# Patient Record
Sex: Male | Born: 1940 | Race: White | Hispanic: No | Marital: Married | Smoking: Never smoker
Health system: Southern US, Community
[De-identification: ages and names within clinical notes are randomized; demographics above are authoritative.]

## PROBLEM LIST (undated history)

## (undated) DIAGNOSIS — M1990 Unspecified osteoarthritis, unspecified site: Secondary | ICD-10-CM

## (undated) DIAGNOSIS — R42 Dizziness and giddiness: Secondary | ICD-10-CM

## (undated) DIAGNOSIS — I499 Cardiac arrhythmia, unspecified: Secondary | ICD-10-CM

## (undated) DIAGNOSIS — E041 Nontoxic single thyroid nodule: Secondary | ICD-10-CM

## (undated) DIAGNOSIS — R251 Tremor, unspecified: Secondary | ICD-10-CM

## (undated) DIAGNOSIS — I639 Cerebral infarction, unspecified: Secondary | ICD-10-CM

## (undated) DIAGNOSIS — I10 Essential (primary) hypertension: Secondary | ICD-10-CM

## (undated) HISTORY — PX: OTHER SURGICAL HISTORY: SHX169

## (undated) HISTORY — PX: PROSTATE SURGERY: SHX751

## (undated) HISTORY — PX: FOOT SURGERY: SHX648

## (undated) HISTORY — PX: SHOULDER SURGERY: SHX246

## (undated) HISTORY — PX: LACERATION REPAIR: SHX5168

---

## 1999-09-02 ENCOUNTER — Encounter: Payer: Self-pay | Admitting: Family Medicine

## 1999-09-02 ENCOUNTER — Ambulatory Visit (HOSPITAL_COMMUNITY): Admission: RE | Admit: 1999-09-02 | Discharge: 1999-09-02 | Payer: Self-pay | Admitting: Family Medicine

## 2001-01-30 ENCOUNTER — Encounter: Payer: Self-pay | Admitting: Gastroenterology

## 2001-01-30 ENCOUNTER — Encounter: Admission: RE | Admit: 2001-01-30 | Discharge: 2001-01-30 | Payer: Self-pay | Admitting: Gastroenterology

## 2001-02-05 ENCOUNTER — Ambulatory Visit (HOSPITAL_COMMUNITY): Admission: RE | Admit: 2001-02-05 | Discharge: 2001-02-05 | Payer: Self-pay | Admitting: Gastroenterology

## 2001-09-26 ENCOUNTER — Encounter: Payer: Self-pay | Admitting: Orthopedic Surgery

## 2001-09-26 ENCOUNTER — Ambulatory Visit: Admission: RE | Admit: 2001-09-26 | Discharge: 2001-09-26 | Payer: Self-pay | Admitting: Orthopedic Surgery

## 2001-12-27 ENCOUNTER — Ambulatory Visit (HOSPITAL_COMMUNITY): Admission: RE | Admit: 2001-12-27 | Discharge: 2001-12-27 | Payer: Self-pay | Admitting: Orthopedic Surgery

## 2002-01-21 ENCOUNTER — Inpatient Hospital Stay (HOSPITAL_COMMUNITY): Admission: RE | Admit: 2002-01-21 | Discharge: 2002-01-24 | Payer: Self-pay | Admitting: Urology

## 2002-01-21 ENCOUNTER — Encounter (INDEPENDENT_AMBULATORY_CARE_PROVIDER_SITE_OTHER): Payer: Self-pay | Admitting: Specialist

## 2002-08-29 ENCOUNTER — Ambulatory Visit (HOSPITAL_COMMUNITY): Admission: RE | Admit: 2002-08-29 | Discharge: 2002-08-29 | Payer: Self-pay | Admitting: Gastroenterology

## 2003-05-06 ENCOUNTER — Ambulatory Visit (HOSPITAL_COMMUNITY): Admission: RE | Admit: 2003-05-06 | Discharge: 2003-05-06 | Payer: Self-pay | Admitting: Gastroenterology

## 2004-06-21 ENCOUNTER — Observation Stay (HOSPITAL_COMMUNITY): Admission: RE | Admit: 2004-06-21 | Discharge: 2004-06-22 | Payer: Self-pay | Admitting: Urology

## 2005-08-19 ENCOUNTER — Inpatient Hospital Stay (HOSPITAL_COMMUNITY): Admission: AC | Admit: 2005-08-19 | Discharge: 2005-08-21 | Payer: Self-pay

## 2005-08-30 ENCOUNTER — Inpatient Hospital Stay (HOSPITAL_COMMUNITY): Admission: EM | Admit: 2005-08-30 | Discharge: 2005-09-01 | Payer: Self-pay | Admitting: Emergency Medicine

## 2005-08-31 ENCOUNTER — Encounter (INDEPENDENT_AMBULATORY_CARE_PROVIDER_SITE_OTHER): Payer: Self-pay | Admitting: *Deleted

## 2005-10-02 ENCOUNTER — Emergency Department (HOSPITAL_COMMUNITY): Admission: EM | Admit: 2005-10-02 | Discharge: 2005-10-02 | Payer: Self-pay | Admitting: Emergency Medicine

## 2005-11-09 ENCOUNTER — Encounter: Admission: RE | Admit: 2005-11-09 | Discharge: 2006-02-07 | Payer: Self-pay | Admitting: *Deleted

## 2006-12-28 IMAGING — CT CT HEAD W/O CM
1 series · 16 of 30 positions shown, 20 images · non-contrast
Comparison: None.

CLINICAL DATA: Code Stroke.  Right body weakness for the past six hours.  Right homonymous hemianopsia.  
HEAD CT WITHOUT CONTRAST ? 08/30/05:
TECHNIQUE: Contiguous axial CT images were obtained from the base of the skull through the vertex according to standard protocol without contrast.

[Series 2: routinehead 4.8 h45s · axial · 0.45mm/px · z∈[-145,-5]mm · 16 of 33 slices shown, 20 images]
[im 2/33  brain]
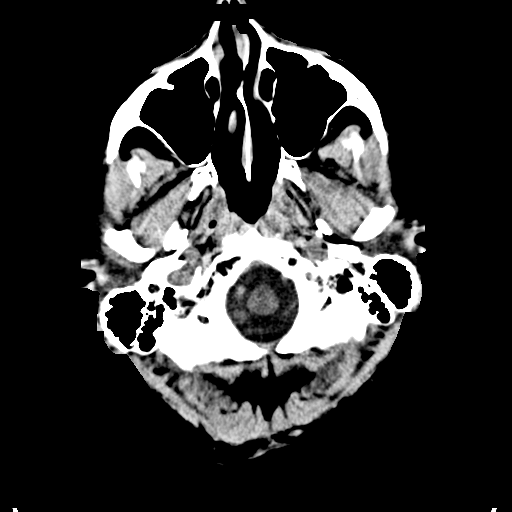
[im 2/33  bone]
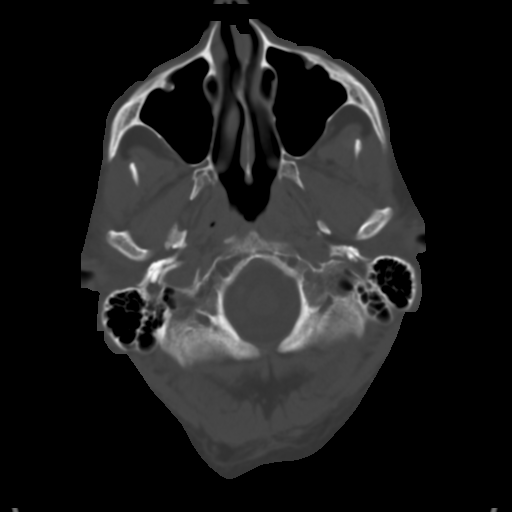
[im 4/33  brain]
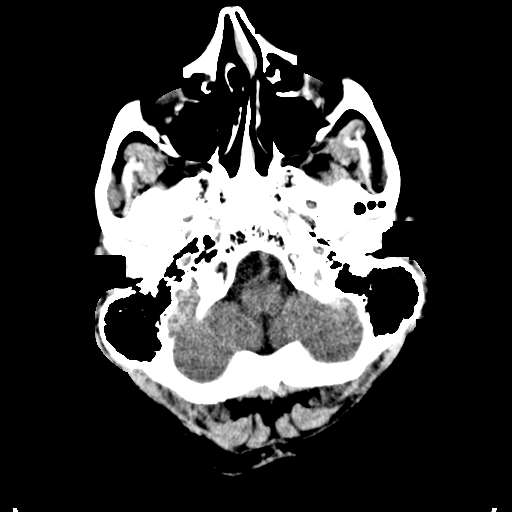
[im 6/33  brain]
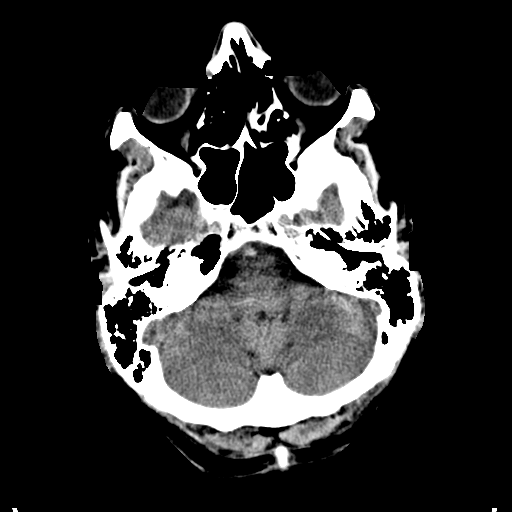
[im 8/33  brain]
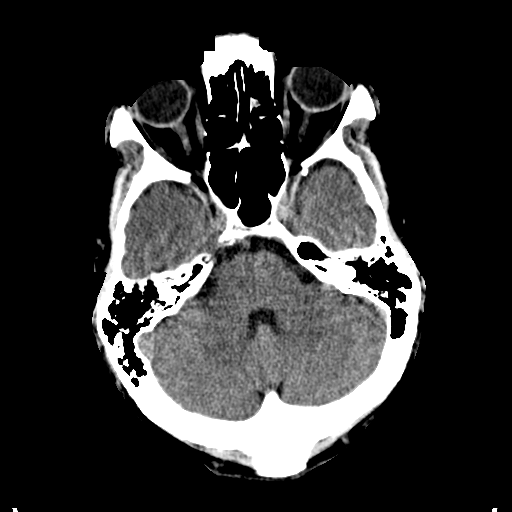
[im 9/33  brain]
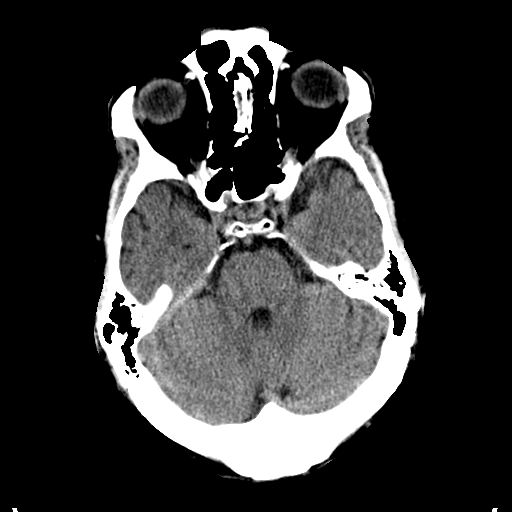
[im 9/33  bone]
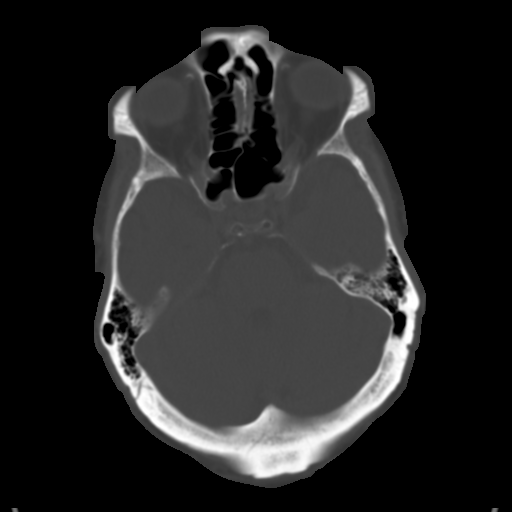
[im 12/33  brain]
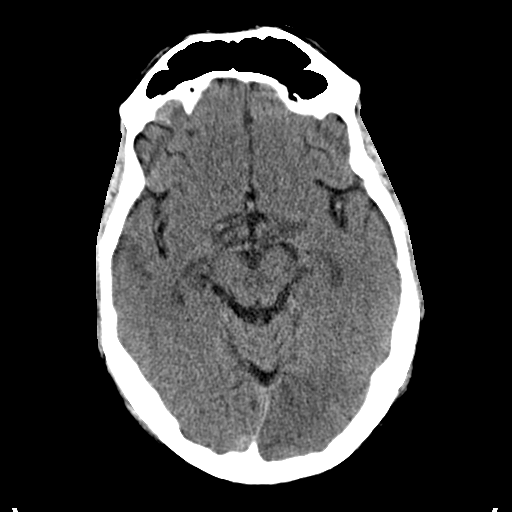
[im 14/33  brain]
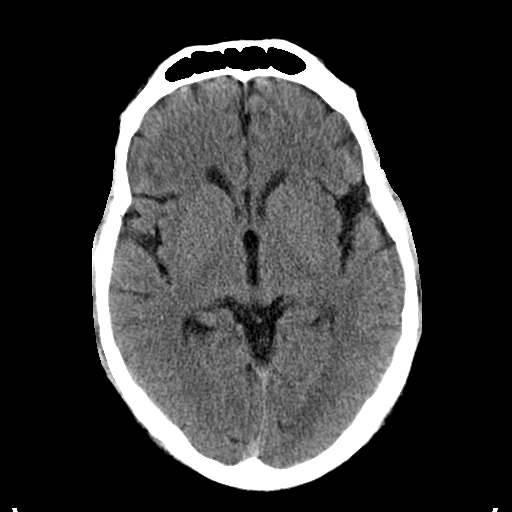
[im 16/33  brain]
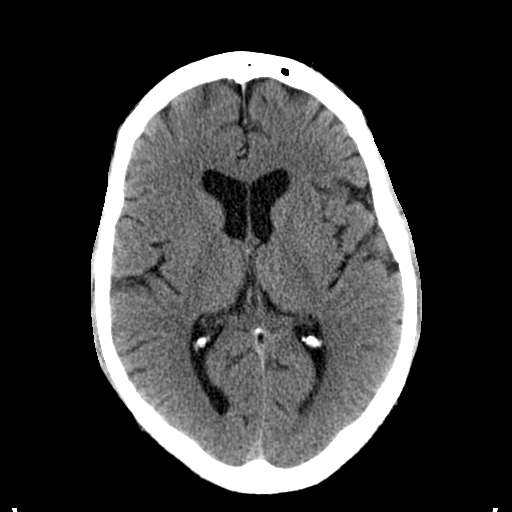
[im 17/33  brain]
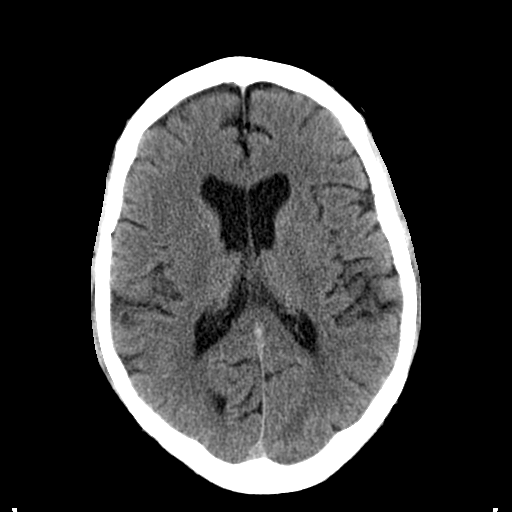
[im 17/33  bone]
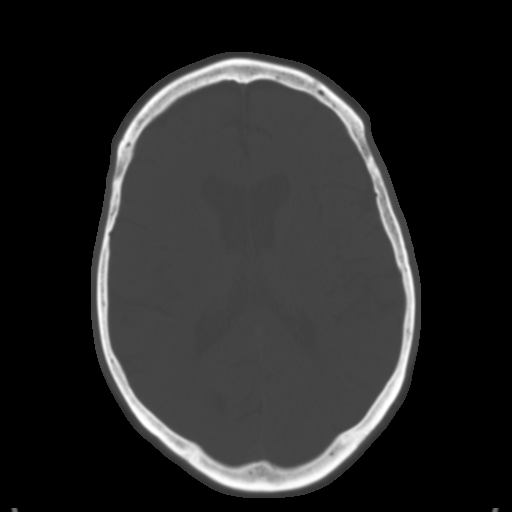
[im 19/33  brain]
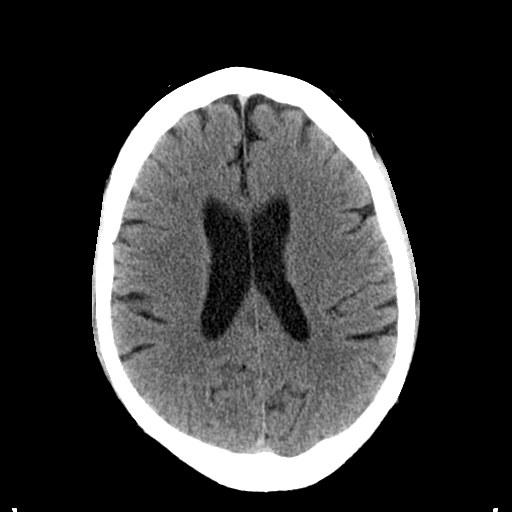
[im 21/33  brain]
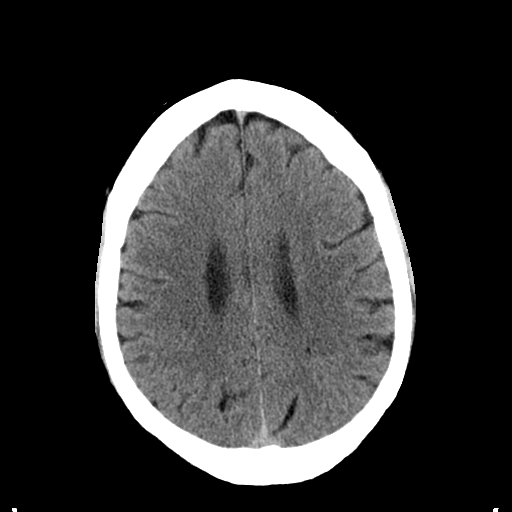
[im 24/33  brain]
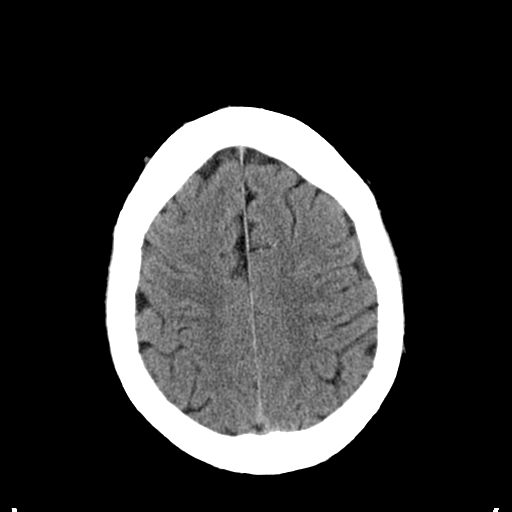
[im 25/33  brain]
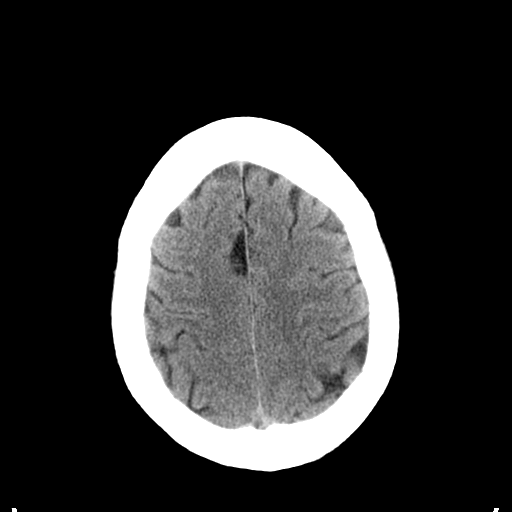
[im 25/33  bone]
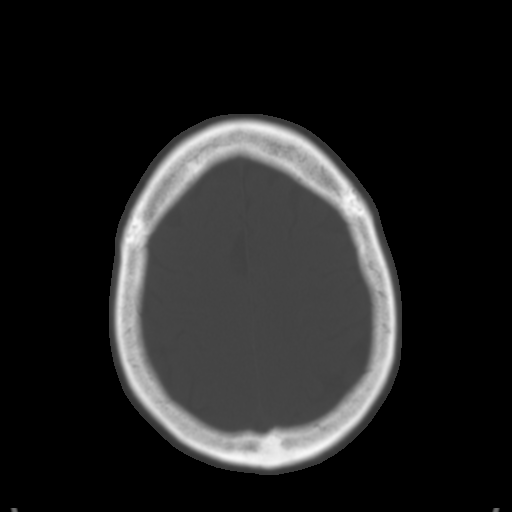
[im 27/33  brain]
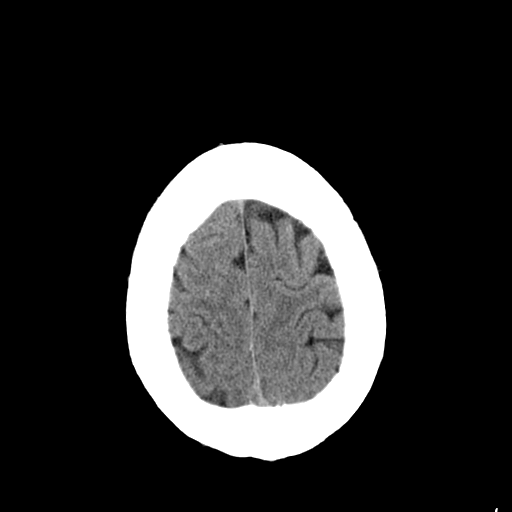
[im 29/33  brain]
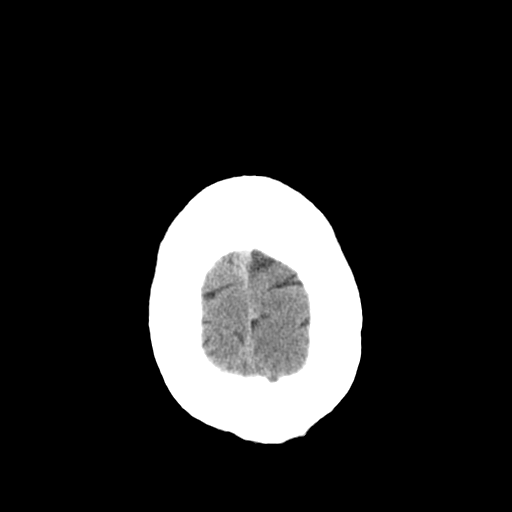
[im 31/33  brain]
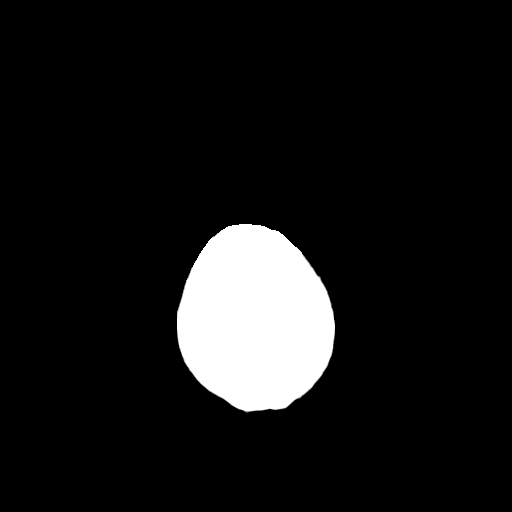

[16 of 30 positions shown; findings below may reference images not displayed]

FINDINGS: There is evidence of edema in the left occipital lobe, consistent with an acute stroke.  There is no associated hemorrhage.  There is no associated significant mass effect or midline shift.  There is no hemorrhage or hematoma elsewhere.  There is no evidence of an acute stroke elsewhere.  The ventricular system is normal in size and appearance.
The bone window images demonstrate no focal osseous abnormalities involving the skull.  The visualized paranasal sinuses and the mastoid air cells appear well aerated.
IMPRESSION: Acute non-hemorrhagic stroke involving the left occipital lobe.
These results were telephoned to Dr. Nikoss in the [HOSPITAL] the time of interpretation on 08/30/05 at 6777 hours.

## 2007-01-28 ENCOUNTER — Encounter (HOSPITAL_BASED_OUTPATIENT_CLINIC_OR_DEPARTMENT_OTHER): Admission: RE | Admit: 2007-01-28 | Discharge: 2007-04-28 | Payer: Self-pay | Admitting: Surgery

## 2007-01-30 ENCOUNTER — Ambulatory Visit (HOSPITAL_COMMUNITY): Admission: RE | Admit: 2007-01-30 | Discharge: 2007-01-30 | Payer: Self-pay | Admitting: Surgery

## 2007-04-30 ENCOUNTER — Encounter (HOSPITAL_BASED_OUTPATIENT_CLINIC_OR_DEPARTMENT_OTHER): Admission: RE | Admit: 2007-04-30 | Discharge: 2007-06-11 | Payer: Self-pay | Admitting: Surgery

## 2008-08-14 ENCOUNTER — Ambulatory Visit: Payer: Self-pay | Admitting: Vascular Surgery

## 2008-08-17 ENCOUNTER — Ambulatory Visit: Payer: Self-pay | Admitting: Vascular Surgery

## 2008-08-20 ENCOUNTER — Ambulatory Visit: Payer: Self-pay | Admitting: Vascular Surgery

## 2008-08-27 ENCOUNTER — Ambulatory Visit: Payer: Self-pay | Admitting: Vascular Surgery

## 2008-08-31 ENCOUNTER — Ambulatory Visit: Payer: Self-pay | Admitting: Vascular Surgery

## 2008-09-07 ENCOUNTER — Ambulatory Visit: Payer: Self-pay | Admitting: Vascular Surgery

## 2008-09-16 ENCOUNTER — Ambulatory Visit: Payer: Self-pay | Admitting: Vascular Surgery

## 2008-09-22 ENCOUNTER — Ambulatory Visit: Payer: Self-pay | Admitting: Vascular Surgery

## 2008-09-23 ENCOUNTER — Ambulatory Visit: Payer: Self-pay | Admitting: Vascular Surgery

## 2008-10-20 ENCOUNTER — Ambulatory Visit: Payer: Self-pay | Admitting: Vascular Surgery

## 2009-06-01 ENCOUNTER — Ambulatory Visit: Payer: Self-pay | Admitting: Vascular Surgery

## 2010-09-06 NOTE — Procedures (Signed)
DUPLEX DEEP VENOUS EXAM - LOWER EXTREMITY   INDICATION:  Follow up right greater saphenous vein ablation.   HISTORY:  Edema:  Yes.  Trauma/Surgery:  Yes.  Pain:  Tender.  PE:  No.  Previous DVT:  Yes.  Anticoagulants:  Other:   DUPLEX EXAM:                CFV   SFV   PopV  PTV    GSV                R  L  R  L  R  L  R   L  R  L  Thrombosis    o  o  o     +     o      +  Spontaneous   +  +  +     o     +      +  Phasic        +  +  +     o     +      +  Augmentation  +  +  +     o     +      +  Compressible  +  +  +     o     +      +  Competent     +  +  +     +     +      +   Legend:  + - yes  o - no  p - partial  D - decreased   IMPRESSION:  1. Deep venous thrombosis noted in the right popliteal vein, age      undetermined.  2. The reverse greater saphenous vein appears ablated from the      saphenofemoral junction to the below-knee level.    _____________________________  Quita Skye. Hart [place], M.D.   MG/MEDQ  D:  09/07/2008  T:  09/07/2008  Job:  [id number]

## 2010-09-06 NOTE — Assessment & Plan Note (Signed)
[facility]   NAME:  LAMAR, METER                 ACCOUNT NO.:  [id number]   MEDICAL RECORD NO.:  [id number]      DATE OF BIRTH:  24-Nov-1940   PHYSICIAN:  Theresia Majors. Tanda Rockers, M.D. VISIT DATE:  05/09/2007                                   OFFICE VISIT   SUBJECTIVE:  Mr. Bunton is a 70 year old man who we are following for a  right medial stasis ulcer.  In the interim, we have treated him with an  Unna wrap.  He returns for follow-up.  There is been no interim  excessive drainage, malodor, pain or fever.   Blood pressure is 109/70, respirations 18, pulse rate 77, temperature is  98.0.  Inspection of the wound shows a well-adherent eschar with no drainage,  mild hyperemia, but persistence of 2+ edema.  The foot is warm, but it  is not feverish. It is nontender.  There is no evidence of an ascending  infection or ischemia.   ASSESSMENT:  Improvement in stasis ulcer.   PLAN:  We will return the patient to an Unna boot and reevaluate in 1  week.      Harold A. Tanda Rockers, M.D.  Electronically Signed     HAN/MEDQ  D:  05/09/2007  T:  05/09/2007  Job:  [id number]

## 2010-09-06 NOTE — Assessment & Plan Note (Signed)
[facility]   NAME:  JHONNIE, ALIANO                 ACCOUNT NO.:  [id number]   MEDICAL RECORD NO.:  [id number]      DATE OF BIRTH:  May 25, 1940   PHYSICIAN:  Theresia Majors. Tanda Rockers, M.D. VISIT DATE:  05/16/2007                                   OFFICE VISIT   SUBJECTIVE:  Mr. Marinaro is a 70 year old man who we are following for  stasis ulcer involving the medial aspect of his right lower extremity  returns for follow-up.  In the interim he has worn an Unna wrap.  There  has been no excessive drainage, malodor, pain or fever.   OBJECTIVE:  Blood pressure is 130/81, respirations 16, pulse rate 79,  temperature is 98.4.  Inspection of the right medial leg shows that there is a softened eschar  loosely adherent. This was mechanically debrided disclosing a punctate  area of full-thickness skin loss, but with 100% granulation.  There was  no excessive tenderness.  Capillary refill was brisk.  Pedal pulses were  faint.   ASSESSMENT:  Clinical improvement of stasis ulcer.   PLAN:  We are returning the patient to an Unna wrap and we will  reevaluate him in 1 week.  We have given the patient prescription for  bilateral below-the-knee 30-40 mm compression hose and we anticipate  that he will make a transition to compression hose during his next  visit.  We have given the patient opportunity to ask questions.  He  understands the treatment objectives and indicates that he will be  compliant.      Harold A. Tanda Rockers, M.D.  Electronically Signed     HAN/MEDQ  D:  05/16/2007  T:  05/16/2007  Job:  [id number]

## 2010-09-06 NOTE — Assessment & Plan Note (Signed)
[facility]   NAME:  [name], [name]                 ACCOUNT NO.:  [id number]   MEDICAL RECORD NO.:  [id number]      DATE OF BIRTH:  09-Dec-1940   PHYSICIAN:  Theresia Majors. Tanda Rockers, M.D. VISIT DATE:  04/15/2007                                   OFFICE VISIT   SUBJECTIVE:  The patient is a 70 year old man with a stasis ulcer  involving his right medial lower extremity.  He is also complaining of  symptomatic callus on his right third toe.  There has been no interim  drainage, malodor, pain, or fever.   OBJECTIVE:  VITAL SIGNS:  Blood pressure 166/68, respirations 18, pulse  rate 98.2.   Inspection of the wound shows a persistence of 1+ to 2+ edema.  There is  no evidence of ischemia.  There is no evidence of ascending infection or  cellulitis.  The callus was excised from the third toe of the right  foot.   ASSESSMENT:  Improving stasis ulcer right medial lower extremity.   PLAN:  We will return the patient to an [place] boot and reevaluate him in 7-  10 days.      Harold A. Tanda Rockers, M.D.  Electronically Signed     HAN/MEDQ  D:  04/15/2007  T:  04/15/2007  Job:  [id number]

## 2010-09-06 NOTE — Assessment & Plan Note (Signed)
OFFICE VISIT   KNUT, RONDINELLI I  DOB:  09/02/1940                                       06/01/2009  [id number]#:[id number]   The patient is well known to me having performed laser ablation of the  medial accessory trunk of the right great saphenous vein on Aug 31, 2008  for a stasis ulcer in the right ankle.  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  He fell and injured his right knee following his procedure  and has been followed by Dr. Milly Jakob for that.  He is concerned about  the skin on his right ankle today and about whether an ulcer may be  developing and reported to the office.  He denies any increasing  swelling or pain or bleeding or infection in the right ankle area.   REVIEW OF SYSTEMS:  The patient denies any chest pain, dyspnea on  exertion, PND or orthopnea.   PHYSICAL EXAMINATION:  Vital signs:  Blood pressure 140/70, heart rate  80.  General:  He is alert and oriented x3.  Chest:  Clear to  auscultation.  Abdomen:  Soft, nontender with no masses.  Lower  extremity exam reveals 3+ femoral, popliteal, and dorsalis pedis pulses  bilaterally.  The right lower leg continues to have hyperpigmentation  with dermato-liposclerosis with no active ulceration.  Edema is 1+.  No  bulging varicosities are noted.   I reviewed all of his previous venous duplex studies and discussed these  with him.  I think the best plan is short-leg elastic compression  stockings.  I see no evidence of any new stasis ulcers so I do not think  we need to repeat the venous duplex today.  If he should develop any  change in the skin with any active ulceration, he will be in touch with  [place] for further followup, otherwise we will see him on a p.r.n. basis.  He will wear short-leg elastic stockings on a chronic basis.     Quita Skye Hart [place], M.D.  Electronically Signed   JDL/MEDQ  D:  06/01/2009  T:   06/02/2009  Job:  [id number]
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
## 2010-09-06 NOTE — Assessment & Plan Note (Signed)
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  He had some mild discomfort in the thigh  and groin area following his procedure which he said was not severe.  His Unna boot has been in place since last week and he has noted some  mild drainage through the Unna boot but states that his discomfort level  has improved significantly and he has had no change in the distal edema  at this point.  He has been ambulating without difficulty.   PHYSICAL EXAMINATION:  Today, blood pressure 129/83, heart rate 70,  respirations 14.  The Unna boot was removed and the ulcer is less deep  than it has been in the past and today measures a maximum 4 cm x 2 cm,  adjacent to the medial malleolus on the right leg.  He has some mild  tenderness to deep palpation of his great saphenous vein from the knee  to the saphenofemoral junction.  There is mild distal edema present and  he has a palpable dorsalis pedis pulse.   Venous duplex exam today revealed no evidence of deep venous obstruction  except in the right popliteal vein which is a chronic obstruction noted  previously.  Right great saphenous vein is totally ablated from the SFJ  to below the knee.   [name] reassured him regarding these findings, will continue weekly Unna boot  changes and [name] will see me in 6 weeks for further follow up.   Quita Skye Hart [place], M.D.  Electronically Signed   JDL/MEDQ  D:  09/07/2008  T:  09/08/2008  Job:  [id number]
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
## 2010-09-06 NOTE — Assessment & Plan Note (Signed)
[facility]   NAME:  [name], [name]                 ACCOUNT NO.:  [id number]   MEDICAL RECORD NO.:  [id number]      DATE OF BIRTH:  Sep 10, 1940   PHYSICIAN:  Jake Shark A. Tanda Rockers, M.D.      VISIT DATE:                                   OFFICE VISIT   SUBJECTIVE:  The patient returns to the clinic for evaluation of a  stasis ulcer.  In the interim he continues to be ambulatory.  There is  been no excessive drainage, malodor, pain or fever.   OBJECTIVE:  Blood pressure is 115/70, respirations 16, pulse rate 74,  temperature 97.7.  Inspection of the right medial lower extremity shows  that the ulcer continues to contract.  There is a 100% granulation with  advance in epithelium and scant drainage.  There is no evidence of  ischemia or ascending infection.   ASSESSMENT:  Clinical improvement of stasis ulcer to external  compression.   PLAN:  We will resume Unna boot compression and reevaluate the patient  in 1 week.      Harold A. Tanda Rockers, M.D.  Electronically Signed     HAN/MEDQ  D:  04/23/2007  T:  04/23/2007  Job:  [id number]

## 2010-09-06 NOTE — Assessment & Plan Note (Signed)
[facility]   NAME:  [name], [name]                 ACCOUNT NO.:  [id number]   MEDICAL RECORD NO.:  [id number]      DATE OF BIRTH:  1941/04/06   PHYSICIAN:  Jake Shark A. Tanda Rockers, M.D. VISIT DATE:  02/18/2007                                   OFFICE VISIT   SUBJECTIVE:  Mr. Puertas is a 70 year old man who we are treating for a  stasis ulcer of the right medial malleolus.  In the interim, we have  treated him with Silverlon swath and an Radio broadcast assistant.  There has been  moderate drainage.  No malodor, no pain and no fever.  He returns for  followup.   OBJECTIVE:  VITAL SIGNS:  Blood pressure is 118/78, respirations 18,  pulse rate 72, temperature is 97.7.  GENERAL:  He is accompanied by his wife.  EXTREMITIES:  Inspection of the right lower extremity shows that the  ulcer has contracted significantly.  There is no evidence of ascending  infection.  The pedal pulse remains palpable.  The erythema surrounding  the wound has decreased significantly, and there is no significant  tenderness.  There is no evidence of ischemia.   ASSESSMENT:  Clinical improvement with compression.   PLAN:  We will resume the use of the Unna boot and reevaluate the  patient in one week.      Harold A. Tanda Rockers, M.D.  Electronically Signed     HAN/MEDQ  D:  02/18/2007  T:  02/18/2007  Job:  [id number]

## 2010-09-06 NOTE — Assessment & Plan Note (Signed)
[facility]   NAME:  [name], [name]                 ACCOUNT NO.:  [id number]   MEDICAL RECORD NO.:  [id number]      DATE OF BIRTH:  March 23, 1941   PHYSICIAN:  Theresia Majors. Tanda Rockers, M.D. VISIT DATE:  05/02/2007                                   OFFICE VISIT   SUBJECTIVE:  [name] is a 70 year old man whom we are treating for a  stasis ulcer on the medial aspect of his right lower extremity. In the  interim, he denies pain. He continues to be ambulatory. There has been  no excessive drainage or malodor.   OBJECTIVE:  Blood pressure is 126/72, respiratory rate 18, pulse rate  74, temperature 97.9.  Inspection of the right lower extremity shows that the edema is well-  controlled as manifested by linear wrinkles in the leg. The ulcer itself  has decreased significantly. There is now 100% healthy granulation with  advancing epithelium. There is no evidence of infection. The foot is  warm, but not feverish. The pedal pulse is palpable.   ASSESSMENT:  Clinical improvement of stasis ulcer.   PLAN:  We will continue the Unna wrap. We will reevaluate the patient in  one week.      Harold A. Tanda Rockers, M.D.  Electronically Signed     HAN/MEDQ  D:  05/02/2007  T:  05/02/2007  Job:  [id number]

## 2010-09-06 NOTE — Assessment & Plan Note (Signed)
[facility]   NAME:  NHIA, HEAPHY                 ACCOUNT NO.:  [id number]   MEDICAL RECORD NO.:  [id number]      DATE OF BIRTH:  July 13, 1940   PHYSICIAN:  Jake Shark A. Tanda Rockers, M.D. VISIT DATE:  03/07/2007                                   OFFICE VISIT   SUBJECTIVE:  [name] is a 70 year old man who has an ulceration on the  medial aspect of his lower extremity due to venostasis.  We have treated  him with compression wrap.  He continues to be ambulatory.  There is no  excessive drainage, malodor or fever.   OBJECTIVE:  Blood pressure is 112/74, respirations 18, pulse rate 67,  temperature is 98.5.  Inspection of the right medial lower extremity  shows that the ulcer is decreased in area and there is now 100%  granulation with advancing epithelium.  There is no evidence of  infection or ischemia.   ASSESSMENT:  Clinically improved stasis ulcer.   PLAN:  We will continue compression wrap and evaluate the patient in 1  wee.      Harold A. Tanda Rockers, M.D.  Electronically Signed     HAN/MEDQ  D:  03/07/2007  T:  03/08/2007  Job:  [id number]

## 2010-09-06 NOTE — Assessment & Plan Note (Signed)
[facility]   NAME:  [name], [name] NO.:  [id number]   MEDICAL RECORD NO.:  [id number]      DATE OF BIRTH:  Dec 28, 1940   PHYSICIAN:  Maxwell Caul, M.D. VISIT DATE:  03/28/2007                                   OFFICE VISIT   LOCATION:  [facility]   Mr. [name] returns today in follow-up for a stasis ulcer involving the  right medial lower extremity.  He has been treated with an Unna wrap.  He continues to be ambulatory.  He does not have any complaints of  excessive pain, malodor or fever.   PHYSICAL EXAMINATION:  His temperature is 97.9, pulse 74, respirations  18, blood pressure 118/87.  The wound itself measures 1 x 1.2 x 0.1.  There has been  obvious some  healing of this wound, although the dimensions have not changed that  much in recent visits.  The wound itself looks dry, but clean.  There is  no evidence of infection.   IMPRESSION:  Predominately stasis wound.  I have gone ahead and put  Chromogranin and hydrogel on this area to see if lubrication will help  with healing. We have reapplied his Unna. Will see him again in a week.  I have given him permission to removed this prior to coming for  showering purposes.           ______________________________  Maxwell Caul, M.D.     MGR/MEDQ  D:  03/28/2007  T:  03/28/2007  Job:  [id number]

## 2010-09-06 NOTE — Assessment & Plan Note (Signed)
[facility]   NAME:  [name]                 ACCOUNT NO.:  [id number]   MEDICAL RECORD NO.:  [id number]      DATE OF BIRTH:  01/25/1941   PHYSICIAN:  Theresia Majors. Tanda Rockers, M.D. VISIT DATE:  02/07/2007                                   OFFICE VISIT   SUBJECTIVE:  [name] is a 70 year old man who we are following for a  right medial lower extremity stasis ulcer.  In the interim, we have  treated him with an Unna wrap.  He continues to be ambulatory.  He is  complaining of moderate drainage, but no malodor, and no fever.  He is  accompanied by his wife.   OBJECTIVE:  VITAL SIGNS:  Blood pressure 130/79, respirations 16, pulse  rate 72, temperature 98.1.  EXTREMITIES:  Inspection of the right lower extremity shows that there  is 2+ edema.  The ulcer itself has a moderate amount of nonviable tissue  with partial slough and necrosis centrally.  Under EMLA block, a sharp  excisional debridement was performed.  Nonviable tissue, chronic  inflammatory tissue and necrosed skin were excised.  The wound was  irrigated.   ASSESSMENT:  Adequately debrided stasis ulcer.   PLAN:  We will apply compression wrap and Unna wrap to control the  edema.  We will continue serial exams and debridements as indicated.  There is no evidence of ascending infection or ischemia. We will see the  patient in one week.      Harold A. Tanda Rockers, M.D.  Electronically Signed     HAN/MEDQ  D:  02/07/2007  T:  02/08/2007  Job:  [id number]

## 2010-09-06 NOTE — Procedures (Signed)
LOWER EXTREMITY VENOUS REFLUX EXAM   INDICATION:  Right lower extremity varicose vein with pain and  nonhealing ulcer.   EXAM:  Using color-flow imaging and pulse Doppler spectral analysis, the  right and left common femoral vein, superficial femoral vein, popliteal,  posterior tibial, greater and lesser saphenous veins are evaluated.  There is no evidence suggesting deep venous insufficiency in the right  lower extremity.   The right saphenofemoral junction is competent.  The right GSV medial  branch is not competent, with the caliber as described below.   The right proximal short saphenous vein demonstrates competency.   GSV Diameter (used if found to be incompetent only)                                            Right    Left  Proximal Greater Saphenous Vein           Medial branch - 0.75 cm cm  Proximal-to-mid-thigh                     0.75 cm  cm  Mid thigh                                 0.83 cm  cm  Mid-distal thigh                          0.83 cm  cm  Distal thigh                              0.81 cm  cm  Knee                                      0.81 cm  cm   IMPRESSION:  1. The right medial branch of the greater saphenous vein reflux is      identified with the caliber ranging from 0.53 cm to 0.75 cm ankle      to proximal thigh.  2. The right greater saphenous vein is not aneurysmal.  3. The right greater saphenous vein is not tortuous.  4. The deep venous system is competent.  5. The right lesser saphenous vein is competent.  6. Deep venous thrombosis noted in the right popliteal vein, age      undetermined, it appears acute.   ___________________________________________  Quita Skye. Hart Rochester, M.D.   MG/MEDQ  D:  08/17/2008  T:  08/17/2008  Job:  [id number]

## 2010-09-06 NOTE — Assessment & Plan Note (Signed)
OFFICE VISIT   JANTZEN, PILGER I  DOB:  05-Mar-1941                                       09/23/2008  EAVWU#:[id number]   The patient called last night when I was on call.  Said that he had had  an Radio broadcast assistant placed earlier in the day and was having soreness and  difficulty with feeling that the [employer] was cutting into the ulcer on  his right medial malleolus.  He had been having weekly Unna boot  treatment and was having success with this.  He reported that he had had  some discomfort before and was having more irritation and felt that it  was on a sensitive spot.  I instructed him to come in and see [place] first  thing this morning and that if he was having continued pain that he  could remove the [employer].  He did in fact remove the Unna boot during  the evening.   On physical exam he does not have any evidence of infection or worsening  skin breakdown in comparing pictures from yesterday.  I discussed  options with the patient.  I explained that we could continue with the  Unna boot treatment or we could try Silvadene under an Ace wrap for  compression.  I explained that with this option that he could change it  on a daily basis and he wishes to try this.   We placed Silvadene 4x4 gauze and 4 inch Ace and instructed him on how  to do this.  He reports that this feels much better and will keep his  followup with Dr. Hart Rochester in approximately 4 weeks.  He will discontinue  the weekly Unna boot treatment.   Larina Earthly, M.D.  Electronically Signed   TFE/MEDQ  D:  09/23/2008  T:  09/24/2008  Job:  [id number]
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
## 2010-09-06 NOTE — Assessment & Plan Note (Signed)
OFFICE VISIT   [name], [name]  DOB:  1940/12/02                                       10/20/2008  ZOXWR#:[id number]   The patient returns today for further followup regarding his venous  stasis in the right ankle.  He is status post laser ablation of his  right great saphenous vein performed by me on May 10 of this year.  He  saw Dr. Arbie Cookey a few weeks ago and changed his regimen to Silvadene to  the ulcer bed with an Ace wrap.  The ulcer has now completely healed and  his biggest concern now is that he fell a week or so ago and injured his  right knee and is being seen by Dr. Milly Jakob for this.  This did not  require surgery.  He does continue to have some swelling in the ankle  but his ulcer has healed.   PHYSICAL EXAMINATION:  On examination today, blood pressure 121/70,  heart rate 69, respirations 14.  He does have 1-2+ edema in the right  foot and evidence of the old healed ulcers adjacent to the medial  malleolus.  He has good arterial pulses in the right foot.   [name] have encouraged him to begin a regimen of wearing a short leg elastic  compression stockings and to elevate his leg at night to prevent  recurrence of the ulcer.  He will return to see [place] on a p.r.n. basis.   Quita Skye Hart Rochester, M.D.  Electronically Signed   JDL/MEDQ  D:  10/20/2008  T:  10/21/2008  Job:  [id number]

## 2010-09-06 NOTE — Assessment & Plan Note (Signed)
[facility]   NAME:  DENO, SIDA                 ACCOUNT NO.:  [id number]   MEDICAL RECORD NO.:  [id number]      DATE OF BIRTH:  23-Nov-1940   PHYSICIAN:  Theresia Majors. Tanda Rockers, M.D. VISIT DATE:  05/27/2007                                   OFFICE VISIT   SUBJECTIVE:  Mr. Sterkel is a 70 year old man who we are following for  stasis ulcer involving his medial right lower extremity.  In the  interim, we have treated him with compression wrap.  There has been no  excessive drainage, malodor, pain or fever.   OBJECTIVE:  Blood pressure is 124/73, respirations are 18, pulse rate  70, temperature 98.  Inspection of the wound shows that it has  completely resolved.  The edema is well controlled with compression  wrap.   ASSESSMENT:  Resolved stasis ulcer.   PLAN:  We are making the transition to a bilateral 30-40 mm open toed  compression garment.  We have given the patient a prescription for the  same.  He is to procure this garment and to begin wearing it  immediately.  Also, we have given him a prescription for a custom insert  for his shoes.  We have given the patient an opportunity to ask  questions.  He seems to understand these instructions and indicates that  he will be compliant.  The patient is discharged.      Harold A. Tanda Rockers, M.D.  Electronically Signed     HAN/MEDQ  D:  05/27/2007  T:  05/28/2007  Job:  [id number]   cc:   Molly Maduro L. Foy [place], M.D.

## 2010-09-06 NOTE — Assessment & Plan Note (Signed)
[facility]   NAME:  MYLEZ, VENABLE                 ACCOUNT NO.:  [id number]   MEDICAL RECORD NO.:  [id number]      DATE OF BIRTH:  1941/03/25   PHYSICIAN:  Jake Shark A. Tanda Rockers, M.D. VISIT DATE:  03/14/2007                                   OFFICE VISIT   SUBJECTIVE:  [name] is a 70 year old man who we are following for a  stasis ulcer involving the right medial lower extremity.  In the  interim, we have treated him with an Unna wrap.  He continues to be  ambulatory.  There has been no excessive pain, malodor or fever.   OBJECTIVE:  Blood pressure is 127/73, respirations 18, pulse rate 74,  temperature is 97.8.  Inspection of the medial lower leg shows that  there is 100% granulation.  There is an advancement of epithelium from  the periphery.  The edema is still 2+.  The pedal pulse remains  palpable.   ASSESSMENT:  Clinical response to compression therapy.   PLAN:  We will renew the Unna wrap and reevaluate the patient in one  week.      Harold A. Tanda Rockers, M.D.  Electronically Signed     HAN/MEDQ  D:  03/14/2007  T:  03/15/2007  Job:  [id number]

## 2010-09-06 NOTE — Assessment & Plan Note (Signed)
[facility]   NAME:  [name], [name]                 ACCOUNT NO.:  [id number]   MEDICAL RECORD NO.:  [id number]      DATE OF BIRTH:  12/18/1940   PHYSICIAN:  Theresia Majors. Tanda Rockers, M.D. VISIT DATE:  04/04/2007                                   OFFICE VISIT   SUBJECTIVE:  [name] is a 70 year old man who we are treating for a  stasis ulcer involving his right lower extremity.  In the interim he has  worn a multilayer compressive wrap.  There has been no excessive  drainage, malodor, pain or fever.  He continues to be ambulatory.   OBJECTIVE:  Blood pressure is 99/73, respirations 18, pulse rate 77,  temperature is 97.8.  Inspection of the right lower extremity shows that  the granulation is persistent.  There is good control of his edema.  There is no evidence of active infection or ischemia.   ASSESSMENT:  Clinical response of stasis ulcer to external compression.   PLAN:  We will return the patient to an Unna boot and reevaluate him in  1 week.      Harold A. Tanda Rockers, M.D.  Electronically Signed     HAN/MEDQ  D:  04/04/2007  T:  04/05/2007  Job:  [id number]

## 2010-09-06 NOTE — Assessment & Plan Note (Signed)
[facility]   NAME:  [name], [name]                 ACCOUNT NO.:  [id number]   MEDICAL RECORD NO.:  [id number]      DATE OF BIRTH:  Feb 08, 1941   PHYSICIAN:  Theresia Majors. Tanda Rockers, M.D. VISIT DATE:  02/28/2007                                   OFFICE VISIT   SUBJECTIVE:  [name] is a 70 year old man who we have followed for  stasis ulcer of the right medial malleolus.  In the interim, we have  treated him with an Radio broadcast assistant.  He denies excessive drainage, malodor,  pain, or fever.   OBJECTIVE:  VITAL SIGNS:  Blood pressure is 117/74, respirations 16,  pulse rate 75, temperature is 98.6.  EXTREMITIES:  Inspection of the right lower extremity shows that the  medial malleolus ulcer has decreased in area and volume.  There remains  an area of erythema and tenderness.  Under EMLA cream and local  infiltration of 4% Xylocaine, the ulcer was completely debrided.  An  excisional debridement was accomplished and tolerated well by the  patient.  There was no evidence of ischemia.   ASSESSMENT:  Clinical improvement.   PLAN:  We reapplied the Unna wrap.  We will reevaluate the patient in 1  week.      Harold A. Tanda Rockers, M.D.  Electronically Signed     HAN/MEDQ  D:  02/28/2007  T:  02/28/2007  Job:  [id number]

## 2010-09-09 NOTE — Op Note (Signed)
NAME:  [name], [name]                           ACCOUNT NO.:  [id number]   MEDICAL RECORD NO.:  [id number]                   PATIENT TYPE:  AMB   LOCATION:  ENDO                                 FACILITY:  [facility]   PHYSICIAN:  Petra Kuba, M.D.                 DATE OF BIRTH:  May 31, 1940   DATE OF PROCEDURE:  08/29/2002  DATE OF DISCHARGE:  08/29/2002                                 OPERATIVE REPORT   PROCEDURE:  Colonoscopy.   INDICATION:  Screening.  The patient does have a family history of colon  cancer as well.  Consent was signed after risks, benefits, methods, options  thoroughly discussed in the office.   MEDICINES USED:  1. Demerol 100.  2. Versed 10.   DESCRIPTION OF PROCEDURE:  Rectal inspection was pertinent for external  hemorrhoids.  Digital exam was negative.  The video colonoscope was inserted  and once past the sigmoid which did have some increased spasm and  tortuosity, was easily able to advance to the cecum.  This did require  rolling him on his back and some abdominal pressure.  No obvious abnormality  was seen on insertion.  The cecum was identified by the appendiceal orifice  and the ileocecal valve.  The scope was slowly withdrawn.  The prep was  adequate.  There was some liquid stool that required washing and suctioning.  On slow withdrawal through the colon, other than some early left-sided  diverticula and spasm, no abnormalities were seen.  Specifically, no polyps,  tumor, or masses.  Once back in the rectum, the scope was retroflexed,  pertinet for some internal hemorrhoids, small.  The scope was then  straightened and readvanced a short ways up the left side of the colon; air  was suctioned, scope removed.  The patient tolerated the procedure well.  There was no obvious immediate complication.   ENDOSCOPIC DIAGNOSES:  1. Internal-external hemorrhoids.  2. Early left-sided diverticula and spasm.  3. Otherwise, within normal limits to the cecum.   PLAN:  1. Gastrointestinal follow-up p.r.n..  2.     Repeat screening in five years.  3. Otherwise return care to Robert L. Foy Guadalajara, M.D. for the customary health     care maintenance to include yearly rectals and guaiacs.                                               Petra Kuba, M.D.    MEM/MEDQ  D:  08/29/2002  T:  08/31/2002  Job:  [id number]   cc:   Molly Maduro L. Foy Gu[place]ara, M.D.  [address] [place]  [place] [address]  Fax: [phone]   [employer] [facility], M.D.  [address]  [place]  [place] [address]  Fax: [phone]

## 2010-09-09 NOTE — Discharge Summary (Signed)
   [name]                           ACCOUNT NO.:  [id number]   MEDICAL RECORD NO.:  [id number]                   PATIENT TYPE:  INP   LOCATION:  0349                                 FACILITY[facility]  [facility]   PHYSICIAN:  [facility], M.D.             DATE OF BIRTH:  1940/08/01   DATE OF ADMISSION:  01/21/2002  DATE OF DISCHARGE:  01/24/2002                                 DISCHARGE SUMMARY   INDICATIONS, MEDICATIONS, ALLERGIES, TOBACCO, ETOH, PAST MEDICAL HISTORY,  SOCIAL HISTORY, PHYSICAL EXAM, AND REVIEW OF SYSTEMS:  All are outlined in  the admitting note.   HOSPITAL COURSE:  The patient was admitted January 21, 2002, underwent  TURP and [place] SP tube placement.  Preoperative postvoid residual 1800 cc.  The patient had a pleasantly uneventful postoperative recovery.  Postoperative day #1 white count 9.2, hemoglobin 11.6.  Patient advanced to  a regular diet.  Postoperative day #2 bladder irrigation was discontinued.  Postoperative day #3 Foley catheter was discontinued.  The patient voided on  his own four to five times pink urine with no clots.  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   DISCHARGE MEDICATIONS:  Macrobid, Vicodin, and Pyridium.   FOLLOW-UP:  The patient will see Dr. Wanda Plump back in the office with a  voiding diary in four to five days.  We will plan on removing [place] tube  at that time.                                                  [employer] [facility], M.D.    RH/MEDQ  D:  01/24/2002  T:  01/24/2002  Job:  [id number]   cc:   Molly Maduro L. Foy Gu[place]ara, M.D.  [address] [place]  [place] [address]  Fax: [phone]   Illene Labrador. [place], M.D.
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
## 2010-09-09 NOTE — Procedures (Signed)
[facility]  Patient:    [name], [name] Visit Number: [id number] MRN: [id number]          Service Type: END Location: ENDO Attending Physician:  Nelda Marseille Dictated by:   Petra Kuba, M.D. Proc. Date: 02/05/01 Admit Date:  02/05/2001   CC:         Marinda Elk, M.D.   Procedure Report  PROCEDURE:  Esophagogastroduodenoscopy.  INDICATIONS:  Atypical chest pain.  PROCEDURE:  Consent was signed after risks, benefits, methods, and options were thoroughly discussed in the office.  MEDICINES USED:  Demerol 60 and Versed 6.  PROCEDURE:  The video endoscope was inserted by direct vision.  The proximal and mid esophagus were normal.  In the distal esophagus was a small fibrous ring just above a small hiatal hernia.  The scope was passed through it with some minimal trauma and advanced through a normal stomach into a normal antrum, normal pylorus, and into a normal duodenal bulb and to a normal second portion of the duodenum.  The scope was withdrawn back to the bulb, and a good look there ruled out ulcers in that location.  The scope was withdrawn back to the stomach and retroflexed.  The angularis, cardia, fundus, lesser, and greater curve were all normal on retroflexed visualization.  Visualization of the stomach was normal.  Air was suctioned, and the scope was slowly withdrawn.  Again, on slow withdrawal, the hiatal hernia at the ring and minimal trauma from the scope were all confirmed.  There was no signs of Barretts or significant reflux esophagitis.  The remaining part of the esophagus was normal.  The scope was removed.  The patient tolerated the procedure well.  There was no obvious immediate complication.  ENDOSCOPIC DIAGNOSES: 1. Small hiatal hernia with a proximal fibrous ring with minimal trauma on passing the scope. 2. Otherwise normal EGD.  PLAN:  Per Marinda Elk, M.D.  Happy to see back p.r.n.  Would recommend a trial of  pump inhibitors if needed or even dilation p.r.n. increased swallowing problems. Dictated by:   Petra Kuba, M.D. Attending Physician:  Nelda Marseille DD:  02/05/01 TD:  02/05/01 Job: [id number]

## 2010-09-09 NOTE — Op Note (Signed)
[name], VALLERY                          ACCOUNT NO.:  [id number]   MEDICAL RECORD NO.:  [id number]                   PATIENT TYPE:  AMB   LOCATION:  DAY                                  FACILITY:  [facility]   PHYSICIAN:  James P. Aplington, M.D.            DATE OF BIRTH:  1940/10/31   DATE OF PROCEDURE:  12/27/2001  DATE OF DISCHARGE:                                 OPERATIVE REPORT   PREOPERATIVE DIAGNOSES:  1. Torn medial meniscus.  2. Osteoarthritis.  3. Chondrocalcinosis, right knee.   POSTOPERATIVE DIAGNOSES:  1. Torn medial meniscus.  2. Osteoarthritis.  3. Chondrocalcinosis, right knee.   OPERATION:  Right knee arthroscopy with 1) partial medial meniscectomy, 2)  debridement of medial and lateral femoral condyle.   SURGEON:  Illene Labrador. Aplington, M.D.   ASSISTANT:  Nurse.   ANESTHESIA:  General.   PATHOLOGY AND JUSTIFICATION FOR PROCEDURE:  Pain and swelling right knee  with MRI demonstrating the above diagnoses, this was confirmed at surgery.  He had severe chondrocalcinosis which seemed to be eating away the medial  meniscus and the articular cartilage of both femoral condyles. The  chondromalacia of the medial femoral condyle which was much worse in terms  of extent that is area of involvement in the lateral femoral condyle and  also seemed to be deeper, I would rate at grade 3/4 medially and 2 to 3/4  bilaterally.   DESCRIPTION OF PROCEDURE:  Satisfactory general anesthesia, pneumatic  tourniquet, thigh stabilizer, right knee was prepped with Duraprep, draped  in a sterile field, superior and medial saline inflow. First through an  anterior lateral portal, the medial compartment of the knee joint was  evaluated. The pathology as described above was noted. I debrided down the  medial femoral condyle with a 3.5 shaver until smooth. He had close to full-  thickness articular cartilage loss in some areas. The torn medial meniscus  was debrided back with baskets  and then shave down until smooth with a 3.5  shaver until it was back to a small stable rim. Looking up in the medial  gutter and suprapatellar area, there was some minimal wear of the patella  but no significant arthroscopic treatment. We then reversed portals, I  performed a small synovectomy laterally. The defect in the mid portion of  the lateral femoral condyle was noted and smoothed down. The lateral  meniscus did not require any surgical treatment. The knee joint was then  irrigated until clear and all fluid possible removed. The two anterior  portals were closed with 4-0 nylon, 20 cc 0.5% Marcaine with adrenaline, 4  mg of morphine were then instilled through the endflap rasp which was  removed and this portal closed with 4-0 nylon as well. Betadine Adaptic dry  sterile dressing were applied. The tourniquet was released. He tolerated the  procedure well and was taken to the recovery room in satisfactory condition  with no known complications.                                               James P. Aplington, M.D.    JPA/MEDQ  D:  12/27/2001  T:  12/27/2001  Job:  [id number]
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
## 2010-09-09 NOTE — Op Note (Signed)
NAMEKEENE, GILKEY                 ACCOUNT NO.:  [id number]   MEDICAL RECORD NO.:  [id number]          PATIENT TYPE:  AMB   LOCATION:  DAY                          [facility]  [facility]   PHYSICIAN:  Boston Service, M.D.DATE OF BIRTH:  [birth year]   DATE OF PROCEDURE:  06/21/2004  DATE OF DISCHARGE:                                 OPERATIVE REPORT   PREOPERATIVE DIAGNOSES:  A 70 year old male with urinary retention after  knee surgery, eventually required TURP September 2003.  Postvoid residual  prior to surgery 1800 mL.  Careful resolution of hypotonic bladder  postoperatively eventually got down to a postvoid residual of about 43 mL.  Seen in the office February, 2006. Hematuria evaluation showed a 1 cm  bladder stone, cystoscopy showed unobstructed prostatic urethra with mildly  hyperemic regrowth. Plans made for cystoscopy under anesthesia, EHL of  bladder stone and TUR VaporTrode treatment of the prostatic urethra.   POSTOPERATIVE DIAGNOSES:  Same.   PROCEDURE:  Cystoscopy, EHL bladder stone, TUR VaporTrode of the prostatic  urethra.   ANESTHESIA:  General.   DRAINS:  24-French Foley.   DESCRIPTION OF PROCEDURE:  The patient was prepped and draped in the  dorsolithotomy position and after institution of an adequate level of  general anesthesia, a well lubricated 21-French panendoscope was gently  inserted at the urethral meatus, normal urethra and sphincter, mildly  hyperemic mucosa within the prostatic urethra. The patient had of 1 cm  bladder stone with associated erythema of the bladder but no evidence of  tumor or abnormality.  EHL probe was selected, stone fragmented easily over  a period of  about 10-20 minutes. Once fine fragments had been created, they  were irrigated from the bladder using the Microvasive irrigator. The  cystoscope was removed and replaced with the continuous flow resectoscope  sheath with the VaporTrode element.  Cautery begun at the 10 o'clock  position on the right lobe and carried down to the 6 o'clock position, begun  again at the 2 o'clock position on the left lobe and carried down to the 6  o'clock position. Once all friable regrowth had been either vaporized or  cauterized, the bladder was filled to capacity, resectoscope sheath was  withdrawn, 24-French Foley was inserted,  immediate return of several  hundred mL's of clear irrigant. The Foley was left to straight drain. The  patient was returned to recovery in satisfactory condition after  administration of a B&O suppository.      RH/MEDQ  D:  06/21/2004  T:  06/21/2004  Job:  [id number]   cc:   Molly Maduro L. Foy Guadalajara, M.D.  [address] [place]  [place] [address]  Fax: [phone]

## 2010-09-09 NOTE — Op Note (Signed)
NAMECYLAS, [name] NO.:  [id number]   MEDICAL RECORD NO.:  [id number]                   PATIENT TYPE:  INP   LOCATION:  0349                                 [facility]ACILITY:  [facility]   PHYSICIAN:  Boston Service, M.D.             DATE OF BIRTH:  01/26/1941   DATE OF PROCEDURE:  01/21/2002  DATE OF DISCHARGE:                                 OPERATIVE REPORT   PREOPERATIVE INDICATION:  A 70 year old male, benign prostatic hypertrophy  and urinary retention.  Previous work-up in our office between 1990 and 2003  included an IVP 12.9 cm right kidney, 13.9 cm left kidney.  Cystoscopy  showed elevated median lobe of the prostate in 1994.  Prostate biopsy in  1995 and 1996 showed benign prostatic hypertrophy.  PSA in 2000, 4.7.  Recent episode of retention after knee surgery, has persisted despite  voiding trial x 3.  Cipro and Flomax.  Have reviewed with patient and family  members the risks and benefits.  Agree to proceed with cystoscopy, TURP/TUR  VaporTrode and Bonanno suprapubic tube placement.  Postvoid residual at the  time of urinary retention, 1800 cc.   POSTOPERATIVE DIAGNOSIS:  Same.   PROCEDURE:  1. Cystoscopy under anesthesia.  2. TURP.  3. Bonanno SP tube.   ANESTHESIA:  General.   DRAINS:  10 [place] SP 24 French Foley.   SPECIMENS:  TUR chips.   DESCRIPTION OF PROCEDURE:  The patient was prepped and draped in the dorsal  lithotomy position after institution of an adequate level of general  anesthesia.  Indwelling Foley catheter had been removed prior to prep and  draping.  Well-lubricated panendoscope was gently inserted at the urethral  meatus.  Normal urethra and sphincter.  Prostatic urethra showed coapting  lateral lobes with elevated median bar.  Orifices were well away from the  prostatic urethra.  With patient in steep Trendelenburg position, bladder  was filled to capacity at about 2000 cc.  Spinal needle was used to enter  the bladder about a fingerbreadth above the symphysis pubis with immediate  return of clear irrigant.  Bonanno tube was passed along the same track,  appeared to enter the bladder anterior to the bubble, was sewn in position  with 3-0 nylon suture.  Immediate return of clear irrigant.  Resectoscope  sheath was then gently inserted.  Resection was initiated with a single  furrow at the 6 o'clock position to allow free efflux of chips.  Resection  was then begun at the 10 o'clock position on the right lobe and carried down  to the 6 o'clock position.  Begun again at the 2 o'clock position of the  left lobe and carried down to the 6 o'clock position.  Small amount of  tissue was resected anteriorly, and no capsule perforations were noted.  Once all resectable tissue had been removed, care was taken to irrigate all  chips  free from the bladder.  Loop was exchanged for the VaporTrode  element.  Adequate hemostasis obtained within the prostatic urethra.  Bladder was then filled to capacity.  Resectoscope sheath was withdrawn, 24  [place] A[place]worth was inserted and left to straight drain.  The patient was  then returned to recovery in satisfactory condition.                                               Boston Service, M.D.    RH/MEDQ  D:  01/21/2002  T:  01/21/2002  Job:  [id number]   cc:   Molly Maduro L. Foy Guadalajara, M.D.  [address] [place]  [place] [address]  Fax: [phone]

## 2010-10-08 ENCOUNTER — Emergency Department (HOSPITAL_COMMUNITY)
Admission: EM | Admit: 2010-10-08 | Discharge: 2010-10-08 | Disposition: A | Discharge status: Home / Self Care | Payer: Medicare Other | Attending: Emergency Medicine | Admitting: Emergency Medicine

## 2010-10-08 DIAGNOSIS — Z8673 Personal history of transient ischemic attack (TIA), and cerebral infarction without residual deficits: Secondary | ICD-10-CM | POA: Insufficient documentation

## 2010-10-08 DIAGNOSIS — M79609 Pain in unspecified limb: Secondary | ICD-10-CM | POA: Insufficient documentation

## 2010-10-08 DIAGNOSIS — I10 Essential (primary) hypertension: Secondary | ICD-10-CM | POA: Insufficient documentation

## 2010-10-08 DIAGNOSIS — R229 Localized swelling, mass and lump, unspecified: Secondary | ICD-10-CM | POA: Insufficient documentation

## 2010-10-08 DIAGNOSIS — Z79899 Other long term (current) drug therapy: Secondary | ICD-10-CM | POA: Insufficient documentation

## 2010-10-08 DIAGNOSIS — M7989 Other specified soft tissue disorders: Secondary | ICD-10-CM | POA: Insufficient documentation

## 2010-10-08 LAB — PROTIME-INR
INR: 0.96 (ref 0.00–1.49)
Prothrombin Time: 13 seconds (ref 11.6–15.2)

## 2010-10-08 LAB — DIFFERENTIAL
Basophils Absolute: 0 10*3/uL (ref 0.0–0.1)
Basophils Relative: 0 % (ref 0–1)
Eosinophils Absolute: 0.8 10*3/uL — ABNORMAL HIGH (ref 0.0–0.7)
Eosinophils Relative: 6 % — ABNORMAL HIGH (ref 0–5)
Lymphocytes Relative: 22 % (ref 12–46)
Lymphs Abs: 2.7 10*3/uL (ref 0.7–4.0)
Monocytes Absolute: 1.3 10*3/uL — ABNORMAL HIGH (ref 0.1–1.0)
Monocytes Relative: 10 % (ref 3–12)
Neutro Abs: 7.8 10*3/uL — ABNORMAL HIGH (ref 1.7–7.7)
Neutrophils Relative %: 62 % (ref 43–77)

## 2010-10-08 LAB — BASIC METABOLIC PANEL
BUN: 16 mg/dL (ref 6–23)
CO2: 29 mEq/L (ref 19–32)
Calcium: 9.3 mg/dL (ref 8.4–10.5)
Chloride: 101 mEq/L (ref 96–112)
Creatinine, Ser: 0.94 mg/dL (ref 0.50–1.35)
GFR calc Af Amer: >60 mL/min (ref >60)
GFR calc non Af Amer: >60 mL/min (ref >60)
Glucose, Bld: 100 mg/dL — ABNORMAL HIGH (ref 70–99)
Potassium: 3.8 mEq/L (ref 3.5–5.1)
Sodium: 138 mEq/L (ref 135–145)

## 2010-10-08 LAB — CBC
HCT: 37.7 % — ABNORMAL LOW (ref 39.0–52.0)
Hemoglobin: 12.8 g/dL — ABNORMAL LOW (ref 13.0–17.0)
MCH: 32.4 pg (ref 26.0–34.0)
MCHC: 34 g/dL (ref 30.0–36.0)
MCV: 95.4 fL (ref 78.0–100.0)
Platelets: 247 10*3/uL (ref 150–400)
RBC: 3.95 MIL/uL — ABNORMAL LOW (ref 4.22–5.81)
RDW: 13.1 % (ref 11.5–15.5)
WBC: 12.6 10*3/uL — ABNORMAL HIGH (ref 4.0–10.5)

## 2010-10-08 LAB — APTT: aPTT: 31 seconds (ref 24–37)

## 2010-10-12 ENCOUNTER — Other Ambulatory Visit (HOSPITAL_BASED_OUTPATIENT_CLINIC_OR_DEPARTMENT_OTHER): Payer: Self-pay | Admitting: Family Medicine

## 2010-10-12 ENCOUNTER — Ambulatory Visit (INDEPENDENT_AMBULATORY_CARE_PROVIDER_SITE_OTHER)
Admission: RE | Admit: 2010-10-12 | Discharge: 2010-10-12 | Disposition: A | Discharge status: Home / Self Care | Payer: Medicare Other | Source: Ambulatory Visit | Attending: Family Medicine | Admitting: Family Medicine

## 2010-10-12 ENCOUNTER — Ambulatory Visit (HOSPITAL_BASED_OUTPATIENT_CLINIC_OR_DEPARTMENT_OTHER): Admission: RE | Admit: 2010-10-12 | Payer: Medicare Other | Source: Ambulatory Visit

## 2010-10-12 ENCOUNTER — Ambulatory Visit (HOSPITAL_BASED_OUTPATIENT_CLINIC_OR_DEPARTMENT_OTHER)
Admission: RE | Admit: 2010-10-12 | Discharge: 2010-10-12 | Disposition: A | Discharge status: Home / Self Care | Payer: Medicare Other | Source: Ambulatory Visit | Attending: Family Medicine | Admitting: Family Medicine

## 2010-10-12 DIAGNOSIS — T148XXA Other injury of unspecified body region, initial encounter: Secondary | ICD-10-CM

## 2010-10-12 DIAGNOSIS — M7989 Other specified soft tissue disorders: Secondary | ICD-10-CM

## 2010-10-12 DIAGNOSIS — S20229A Contusion of unspecified back wall of thorax, initial encounter: Secondary | ICD-10-CM

## 2010-10-12 DIAGNOSIS — M79609 Pain in unspecified limb: Secondary | ICD-10-CM | POA: Insufficient documentation

## 2010-10-12 DIAGNOSIS — M1120 Other chondrocalcinosis, unspecified site: Secondary | ICD-10-CM | POA: Insufficient documentation

## 2010-10-12 DIAGNOSIS — M542 Cervicalgia: Secondary | ICD-10-CM

## 2010-10-12 DIAGNOSIS — Z1810 Retained metal fragments, unspecified: Secondary | ICD-10-CM

## 2010-10-12 DIAGNOSIS — IMO0002 Reserved for concepts with insufficient information to code with codable children: Secondary | ICD-10-CM | POA: Insufficient documentation

## 2010-10-12 DIAGNOSIS — M1710 Unilateral primary osteoarthritis, unspecified knee: Secondary | ICD-10-CM | POA: Insufficient documentation

## 2010-10-12 DIAGNOSIS — S7010XA Contusion of unspecified thigh, initial encounter: Secondary | ICD-10-CM

## 2010-10-12 DIAGNOSIS — X58XXXA Exposure to other specified factors, initial encounter: Secondary | ICD-10-CM

## 2010-10-12 DIAGNOSIS — R52 Pain, unspecified: Secondary | ICD-10-CM

## 2010-10-12 DIAGNOSIS — R233 Spontaneous ecchymoses: Secondary | ICD-10-CM | POA: Insufficient documentation

## 2010-10-12 MED ORDER — IOHEXOL 300 MG/ML  SOLN
100.0000 mL | Freq: Once | INTRAMUSCULAR | Status: AC | PRN
  Administered 2010-10-12: 100 mL via INTRAVENOUS

## 2010-11-14 ENCOUNTER — Ambulatory Visit: Payer: Medicare Other | Attending: Family Medicine | Admitting: Physical Therapy

## 2010-11-14 DIAGNOSIS — IMO0001 Reserved for inherently not codable concepts without codable children: Secondary | ICD-10-CM | POA: Insufficient documentation

## 2010-11-14 DIAGNOSIS — M25569 Pain in unspecified knee: Secondary | ICD-10-CM | POA: Insufficient documentation

## 2010-11-14 DIAGNOSIS — R269 Unspecified abnormalities of gait and mobility: Secondary | ICD-10-CM | POA: Insufficient documentation

## 2010-11-14 DIAGNOSIS — R5381 Other malaise: Secondary | ICD-10-CM | POA: Insufficient documentation

## 2010-11-14 DIAGNOSIS — M25669 Stiffness of unspecified knee, not elsewhere classified: Secondary | ICD-10-CM | POA: Insufficient documentation

## 2010-11-17 ENCOUNTER — Encounter: Payer: Medicare Other | Admitting: *Deleted

## 2010-11-21 ENCOUNTER — Ambulatory Visit: Payer: Medicare Other | Admitting: Physical Therapy

## 2010-11-23 ENCOUNTER — Ambulatory Visit: Payer: Medicare Other | Attending: Family Medicine | Admitting: Physical Therapy

## 2010-11-23 DIAGNOSIS — R5381 Other malaise: Secondary | ICD-10-CM | POA: Insufficient documentation

## 2010-11-23 DIAGNOSIS — IMO0001 Reserved for inherently not codable concepts without codable children: Secondary | ICD-10-CM | POA: Insufficient documentation

## 2010-11-23 DIAGNOSIS — R269 Unspecified abnormalities of gait and mobility: Secondary | ICD-10-CM | POA: Insufficient documentation

## 2010-11-23 DIAGNOSIS — M25569 Pain in unspecified knee: Secondary | ICD-10-CM | POA: Insufficient documentation

## 2010-11-23 DIAGNOSIS — M25669 Stiffness of unspecified knee, not elsewhere classified: Secondary | ICD-10-CM | POA: Insufficient documentation

## 2010-11-29 ENCOUNTER — Ambulatory Visit: Payer: Medicare Other | Admitting: Physical Therapy

## 2010-12-01 ENCOUNTER — Ambulatory Visit: Payer: Medicare Other | Admitting: Physical Therapy

## 2010-12-07 ENCOUNTER — Other Ambulatory Visit (HOSPITAL_BASED_OUTPATIENT_CLINIC_OR_DEPARTMENT_OTHER): Payer: Self-pay | Admitting: Family Medicine

## 2010-12-07 ENCOUNTER — Ambulatory Visit (HOSPITAL_BASED_OUTPATIENT_CLINIC_OR_DEPARTMENT_OTHER)
Admission: RE | Admit: 2010-12-07 | Discharge: 2010-12-07 | Disposition: A | Discharge status: Home / Self Care | Payer: Medicare Other | Source: Ambulatory Visit | Attending: Family Medicine | Admitting: Family Medicine

## 2010-12-07 DIAGNOSIS — H539 Unspecified visual disturbance: Secondary | ICD-10-CM

## 2010-12-07 DIAGNOSIS — Z8673 Personal history of transient ischemic attack (TIA), and cerebral infarction without residual deficits: Secondary | ICD-10-CM | POA: Insufficient documentation

## 2010-12-07 DIAGNOSIS — G9389 Other specified disorders of brain: Secondary | ICD-10-CM | POA: Insufficient documentation

## 2010-12-07 MED ORDER — IOHEXOL 300 MG/ML  SOLN
80.0000 mL | Freq: Once | INTRAMUSCULAR | Status: AC | PRN
  Administered 2010-12-07: 80 mL via INTRAVENOUS

## 2011-02-13 ENCOUNTER — Encounter: Payer: Self-pay | Admitting: Family Medicine

## 2011-02-13 ENCOUNTER — Emergency Department (INDEPENDENT_AMBULATORY_CARE_PROVIDER_SITE_OTHER): Payer: Medicare Other

## 2011-02-13 ENCOUNTER — Emergency Department (HOSPITAL_BASED_OUTPATIENT_CLINIC_OR_DEPARTMENT_OTHER)
Admission: EM | Admit: 2011-02-13 | Discharge: 2011-02-13 | Disposition: A | Discharge status: Home / Self Care | Payer: Medicare Other | Attending: Emergency Medicine | Admitting: Emergency Medicine

## 2011-02-13 ENCOUNTER — Other Ambulatory Visit: Payer: Self-pay

## 2011-02-13 DIAGNOSIS — R42 Dizziness and giddiness: Secondary | ICD-10-CM | POA: Insufficient documentation

## 2011-02-13 DIAGNOSIS — Z8679 Personal history of other diseases of the circulatory system: Secondary | ICD-10-CM | POA: Insufficient documentation

## 2011-02-13 DIAGNOSIS — R079 Chest pain, unspecified: Secondary | ICD-10-CM

## 2011-02-13 DIAGNOSIS — R0789 Other chest pain: Secondary | ICD-10-CM | POA: Insufficient documentation

## 2011-02-13 HISTORY — DX: Dizziness and giddiness: R42

## 2011-02-13 HISTORY — DX: Cerebral infarction, unspecified: I63.9

## 2011-02-13 HISTORY — DX: Tremor, unspecified: R25.1

## 2011-02-13 LAB — CK TOTAL AND CKMB (NOT AT ARMC)
CK, MB: 2.6 ng/mL (ref 0.3–4.0)
Relative Index: INVALID (ref 0.0–2.5)
Total CK: 69 U/L (ref 7–232)

## 2011-02-13 LAB — COMPREHENSIVE METABOLIC PANEL
ALT: 11 U/L (ref 0–53)
AST: 15 U/L (ref 0–37)
Albumin: 3.8 g/dL (ref 3.5–5.2)
Alkaline Phosphatase: 52 U/L (ref 39–117)
BUN: 14 mg/dL (ref 6–23)
CO2: 25 mEq/L (ref 19–32)
Calcium: 9.1 mg/dL (ref 8.4–10.5)
Chloride: 108 mEq/L (ref 96–112)
Creatinine, Ser: 0.7 mg/dL (ref 0.50–1.35)
GFR calc Af Amer: >90 mL/min (ref >90)
GFR calc non Af Amer: >90 mL/min (ref >90)
Glucose, Bld: 109 mg/dL — ABNORMAL HIGH (ref 70–99)
Potassium: 3.8 mEq/L (ref 3.5–5.1)
Sodium: 141 mEq/L (ref 135–145)
Total Bilirubin: 0.4 mg/dL (ref 0.3–1.2)
Total Protein: 7.3 g/dL (ref 6.0–8.3)

## 2011-02-13 LAB — DIFFERENTIAL
Basophils Absolute: 0 10*3/uL (ref 0.0–0.1)
Basophils Relative: 0 % (ref 0–1)
Eosinophils Absolute: 0.3 10*3/uL (ref 0.0–0.7)
Eosinophils Relative: 3 % (ref 0–5)
Lymphocytes Relative: 18 % (ref 12–46)
Lymphs Abs: 1.8 10*3/uL (ref 0.7–4.0)
Monocytes Absolute: 0.8 10*3/uL (ref 0.1–1.0)
Monocytes Relative: 8 % (ref 3–12)
Neutro Abs: 7 10*3/uL (ref 1.7–7.7)
Neutrophils Relative %: 71 % (ref 43–77)

## 2011-02-13 LAB — CBC
HCT: 42.4 % (ref 39.0–52.0)
Hemoglobin: 14.2 g/dL (ref 13.0–17.0)
MCH: 31.1 pg (ref 26.0–34.0)
MCHC: 33.5 g/dL (ref 30.0–36.0)
MCV: 92.8 fL (ref 78.0–100.0)
Platelets: 226 10*3/uL (ref 150–400)
RBC: 4.57 MIL/uL (ref 4.22–5.81)
RDW: 14 % (ref 11.5–15.5)
WBC: 9.9 10*3/uL (ref 4.0–10.5)

## 2011-02-13 LAB — TROPONIN I: Troponin I: <0.3 ng/mL (ref <0.30)

## 2011-02-13 NOTE — ED Notes (Signed)
PCXR performed

## 2011-02-13 NOTE — ED Notes (Signed)
Care assumed

## 2011-02-13 NOTE — ED Notes (Signed)
MD at bedside. 
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
## 2011-02-13 NOTE — ED Notes (Signed)
Patient is resting comfortably. 

## 2011-02-13 NOTE — ED Notes (Signed)
Warm blanket given

## 2011-02-13 NOTE — ED Notes (Signed)
Pt c/o "4 episodes of dizziness this morning". Pt reports h/o vertigo and currently taking Meclizine. Pt sts left sided chest pain and left arm pain began 1 hr ago. Pt denies shob, n/v.

## 2012-06-22 ENCOUNTER — Emergency Department (HOSPITAL_BASED_OUTPATIENT_CLINIC_OR_DEPARTMENT_OTHER)
Admission: EM | Admit: 2012-06-22 | Discharge: 2012-06-22 | Disposition: A | Discharge status: Home / Self Care | Payer: No Typology Code available for payment source | Attending: Emergency Medicine | Admitting: Emergency Medicine

## 2012-06-22 ENCOUNTER — Encounter (HOSPITAL_BASED_OUTPATIENT_CLINIC_OR_DEPARTMENT_OTHER): Payer: Self-pay | Admitting: *Deleted

## 2012-06-22 DIAGNOSIS — Y92410 Unspecified street and highway as the place of occurrence of the external cause: Secondary | ICD-10-CM | POA: Insufficient documentation

## 2012-06-22 DIAGNOSIS — IMO0002 Reserved for concepts with insufficient information to code with codable children: Secondary | ICD-10-CM | POA: Insufficient documentation

## 2012-06-22 DIAGNOSIS — Y9389 Activity, other specified: Secondary | ICD-10-CM | POA: Insufficient documentation

## 2012-06-22 DIAGNOSIS — Z8673 Personal history of transient ischemic attack (TIA), and cerebral infarction without residual deficits: Secondary | ICD-10-CM | POA: Insufficient documentation

## 2012-06-22 DIAGNOSIS — R42 Dizziness and giddiness: Secondary | ICD-10-CM | POA: Insufficient documentation

## 2012-06-22 DIAGNOSIS — S0990XA Unspecified injury of head, initial encounter: Secondary | ICD-10-CM | POA: Insufficient documentation

## 2012-06-22 DIAGNOSIS — Z79899 Other long term (current) drug therapy: Secondary | ICD-10-CM | POA: Insufficient documentation
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
## 2012-10-09 ENCOUNTER — Encounter (HOSPITAL_COMMUNITY): Payer: Self-pay | Admitting: Pharmacy Technician

## 2012-10-15 NOTE — Patient Instructions (Addendum)
Your procedure is scheduled on: 10/24/2012  Report to [facility] at 1020  AM.  Call this number if you have problems the morning of surgery: [phone]   Do not eat food or drink liquids :After Midnight.      Take these medicines the morning of surgery with A SIP OF WATER: avodart, antivert, de;tasone, propranolol   Do not wear jewelry, make-up or nail polish.  Do not wear lotions, powders, or perfumes.   Do not shave 48 hours prior to surgery.  Do not bring valuables to the hospital.  Contacts, dentures or bridgework may not be worn into surgery.  Leave suitcase in the car. After surgery it may be brought to your room.  For patients admitted to the hospital, checkout time is 11:00 AM the day of discharge.   Patients discharged the day of surgery will not be allowed to drive home.  :     Please read over the following fact sheets that you were given: Coughing and Deep Breathing, Surgical Site Infection Prevention, Anesthesia Post-op Instructions and Care and Recovery After Surgery    Cataract A cataract is a clouding of the lens of the eye. When a lens becomes cloudy, vision is reduced based on the degree and nature of the clouding. Many cataracts reduce vision to some degree. Some cataracts make people more near-sighted as they develop. Other cataracts increase glare. Cataracts that are ignored and become worse can sometimes look white. The white color can be seen through the pupil. CAUSES   Aging. However, cataracts may occur at any age, even in newborns.   Certain drugs.   Trauma to the eye.   Certain diseases such as diabetes.   Specific eye diseases such as chronic inflammation inside the eye or a sudden attack of a rare form of glaucoma.   Inherited or acquired medical problems.  SYMPTOMS   Gradual, progressive drop in vision in the affected eye.   Severe, rapid visual loss. This most often happens when trauma is the cause.  DIAGNOSIS  To detect a cataract, an eye  doctor examines the lens. Cataracts are best diagnosed with an exam of the eyes with the pupils enlarged (dilated) by drops.  TREATMENT  For an early cataract, vision may improve by using different eyeglasses or stronger lighting. If that does not help your vision, surgery is the only effective treatment. A cataract needs to be surgically removed when vision loss interferes with your everyday activities, such as driving, reading, or watching TV. A cataract may also have to be removed if it prevents examination or treatment of another eye problem. Surgery removes the cloudy lens and usually replaces it with a substitute lens (intraocular lens, IOL).  At a time when both you and your doctor agree, the cataract will be surgically removed. If you have cataracts in both eyes, only one is usually removed at a time. This allows the operated eye to heal and be out of danger from any possible problems after surgery (such as infection or poor wound healing). In rare cases, a cataract may be doing damage to your eye. In these cases, your caregiver may advise surgical removal right away. The vast majority of people who have cataract surgery have better vision afterward. HOME CARE INSTRUCTIONS  If you are not planning surgery, you may be asked to do the following:  Use different eyeglasses.   Use stronger or brighter lighting.   Ask your eye doctor about reducing your medicine dose or changing  medicines if it is thought that a medicine caused your cataract. Changing medicines does not make the cataract go away on its own.   Become familiar with your surroundings. Poor vision can lead to injury. Avoid bumping into things on the affected side. You are at a higher risk for tripping or falling.   Exercise extreme care when driving or operating machinery.   Wear sunglasses if you are sensitive to bright light or experiencing problems with glare.  SEEK IMMEDIATE MEDICAL CARE IF:   You have a worsening or sudden  vision loss.   You notice redness, swelling, or increasing pain in the eye.   You have a fever.  Document Released: 04/10/2005 Document Revised: 03/30/2011 Document Reviewed: 12/02/2010 [facility] Patient Information 2012 [place].PATIENT INSTRUCTIONS POST-ANESTHESIA  IMMEDIATELY FOLLOWING SURGERY:  Do not drive or operate machinery for the first twenty four hours after surgery.  Do not make any important decisions for twenty four hours after surgery or while taking narcotic pain medications or sedatives.  If you develop intractable nausea and vomiting or a severe headache please notify your doctor immediately.  FOLLOW-UP:  Please make an appointment with your surgeon as instructed. You do not need to follow up with anesthesia unless specifically instructed to do so.  WOUND CARE INSTRUCTIONS (if applicable):  Keep a dry clean dressing on the anesthesia/puncture wound site if there is drainage.  Once the wound has quit draining you may leave it open to air.  Generally you should leave the bandage intact for twenty four hours unless there is drainage.  If the epidural site drains for more than 36-48 hours please call the anesthesia department.  QUESTIONS?:  Please feel free to call your physician or the hospital operator if you have any questions, and they will be happy to assist you.

## 2012-10-16 ENCOUNTER — Encounter (HOSPITAL_COMMUNITY)
Admission: RE | Admit: 2012-10-16 | Discharge: 2012-10-16 | Disposition: A | Discharge status: Home / Self Care | Payer: Medicare Other | Source: Ambulatory Visit | Attending: Ophthalmology | Admitting: Ophthalmology

## 2012-10-16 ENCOUNTER — Encounter (HOSPITAL_COMMUNITY): Payer: Self-pay | Admitting: Pharmacy Technician

## 2012-10-16 ENCOUNTER — Other Ambulatory Visit: Payer: Self-pay

## 2012-10-16 ENCOUNTER — Encounter (HOSPITAL_COMMUNITY): Payer: Self-pay

## 2012-10-16 LAB — BASIC METABOLIC PANEL
BUN: 14 mg/dL (ref 6–23)
CO2: 29 mEq/L (ref 19–32)
Calcium: 9.7 mg/dL (ref 8.4–10.5)
Chloride: 103 mEq/L (ref 96–112)
Creatinine, Ser: 0.96 mg/dL (ref 0.50–1.35)
GFR calc Af Amer: >90 mL/min (ref >90)
GFR calc non Af Amer: 82 mL/min — ABNORMAL LOW (ref >90)
Glucose, Bld: 97 mg/dL (ref 70–99)
Potassium: 4 mEq/L (ref 3.5–5.1)
Sodium: 139 mEq/L (ref 135–145)

## 2012-10-16 LAB — HEMOGLOBIN AND HEMATOCRIT, BLOOD
HCT: 41.8 % (ref 39.0–52.0)
Hemoglobin: 14.3 g/dL (ref 13.0–17.0)

## 2012-10-23 MED ORDER — CYCLOPENTOLATE-PHENYLEPHRINE 0.2-1 % OP SOLN
OPHTHALMIC | Status: AC
  Filled 2012-10-23: qty 2

## 2012-10-23 MED ORDER — NEOMYCIN-POLYMYXIN-DEXAMETH 3.5-10000-0.1 OP OINT
TOPICAL_OINTMENT | OPHTHALMIC | Status: AC
  Filled 2012-10-23: qty 3.5

## 2012-10-23 MED ORDER — TETRACAINE HCL 0.5 % OP SOLN
OPHTHALMIC | Status: AC
  Filled 2012-10-23: qty 2

## 2012-10-23 MED ORDER — LIDOCAINE HCL (PF) 1 % IJ SOLN
INTRAMUSCULAR | Status: AC
  Filled 2012-10-23: qty 2

## 2012-10-23 MED ORDER — LIDOCAINE HCL 3.5 % OP GEL
OPHTHALMIC | Status: AC
  Filled 2012-10-23: qty 5

## 2012-10-24 ENCOUNTER — Encounter (HOSPITAL_COMMUNITY): Payer: Self-pay | Admitting: Anesthesiology

## 2012-10-24 ENCOUNTER — Ambulatory Visit (HOSPITAL_COMMUNITY)
Admission: RE | Admit: 2012-10-24 | Discharge: 2012-10-24 | Disposition: A | Discharge status: Home / Self Care | Payer: Medicare Other | Source: Ambulatory Visit | Attending: Ophthalmology | Admitting: Ophthalmology

## 2012-10-24 ENCOUNTER — Encounter (HOSPITAL_COMMUNITY)
Admission: RE | Disposition: A | Discharge status: Home / Self Care | Payer: Self-pay | Source: Ambulatory Visit | Attending: Ophthalmology

## 2012-10-24 ENCOUNTER — Encounter (HOSPITAL_COMMUNITY): Payer: Self-pay | Admitting: *Deleted

## 2012-10-24 ENCOUNTER — Ambulatory Visit (HOSPITAL_COMMUNITY): Payer: Medicare Other | Admitting: Anesthesiology

## 2012-10-24 DIAGNOSIS — Z01810 Encounter for preprocedural cardiovascular examination: Secondary | ICD-10-CM | POA: Insufficient documentation

## 2012-10-24 DIAGNOSIS — Z01812 Encounter for preprocedural laboratory examination: Secondary | ICD-10-CM | POA: Insufficient documentation

## 2012-10-24 DIAGNOSIS — H2589 Other age-related cataract: Secondary | ICD-10-CM | POA: Insufficient documentation

## 2012-10-24 HISTORY — PX: CATARACT EXTRACTION W/PHACO: SHX586

## 2012-10-24 SURGERY — PHACOEMULSIFICATION, CATARACT, WITH IOL INSERTION
Anesthesia: Monitor Anesthesia Care | Site: Eye | Laterality: Left | Wound class: Clean

## 2012-10-24 MED ORDER — POVIDONE-IODINE 5 % OP SOLN
OPHTHALMIC | Status: DC | PRN
  Administered 2012-10-24: 1 via OPHTHALMIC

## 2012-10-24 MED ORDER — PHENYLEPHRINE HCL 10 MG/ML IJ SOLN
INTRAMUSCULAR | Status: DC | PRN
  Administered 2012-10-24: .4 mg

## 2012-10-24 MED ORDER — PHENYLEPHRINE HCL 2.5 % OP SOLN
OPHTHALMIC | Status: AC
  Filled 2012-10-24: qty 15

## 2012-10-24 MED ORDER — NEOMYCIN-POLYMYXIN-DEXAMETH 0.1 % OP OINT
TOPICAL_OINTMENT | OPHTHALMIC | Status: DC | PRN
  Administered 2012-10-24: 1 via OPHTHALMIC

## 2012-10-24 MED ORDER — LACTATED RINGERS IV SOLN
INTRAVENOUS | Status: DC
  Administered 2012-10-24: 11:00:00 via INTRAVENOUS

## 2012-10-24 MED ORDER — MIDAZOLAM HCL 2 MG/2ML IJ SOLN
INTRAMUSCULAR | Status: AC
  Filled 2012-10-24: qty 2

## 2012-10-24 MED ORDER — PROVISC 10 MG/ML IO SOLN
INTRAOCULAR | Status: DC | PRN
  Administered 2012-10-24: 8.5 mg via INTRAOCULAR

## 2012-10-24 MED ORDER — MIDAZOLAM HCL 2 MG/2ML IJ SOLN
1.0000 mg | INTRAMUSCULAR | Status: DC | PRN
  Administered 2012-10-24: 2 mg via INTRAVENOUS

## 2012-10-24 MED ORDER — LIDOCAINE HCL 3.5 % OP GEL
1.0000 "application " | Freq: Once | OPHTHALMIC | Status: AC
  Administered 2012-10-24: 1 via OPHTHALMIC

## 2012-10-24 MED ORDER — EPINEPHRINE HCL 1 MG/ML IJ SOLN
INTRAMUSCULAR | Status: AC
  Filled 2012-10-24: qty 1

## 2012-10-24 MED ORDER — LIDOCAINE HCL (PF) 1 % IJ SOLN
INTRAMUSCULAR | Status: DC | PRN
  Administered 2012-10-24: .9 mL

## 2012-10-24 MED ORDER — EPINEPHRINE HCL 1 MG/ML IJ SOLN
INTRAOCULAR | Status: DC | PRN
  Administered 2012-10-24: 12:00:00

## 2012-10-24 MED ORDER — PHENYLEPHRINE HCL 10 MG/ML IJ SOLN
INTRAMUSCULAR | Status: AC
  Filled 2012-10-24: qty 1

## 2012-10-24 MED ORDER — CYCLOPENTOLATE-PHENYLEPHRINE 0.2-1 % OP SOLN
1.0000 [drp] | OPHTHALMIC | Status: AC
  Administered 2012-10-24 (×3): 1 [drp] via OPHTHALMIC

## 2012-10-24 MED ORDER — TETRACAINE HCL 0.5 % OP SOLN
1.0000 [drp] | OPHTHALMIC | Status: AC
  Administered 2012-10-24 (×3): 1 [drp] via OPHTHALMIC

## 2012-10-24 MED ORDER — BSS IO SOLN
INTRAOCULAR | Status: DC | PRN
  Administered 2012-10-24: 15 mL via INTRAOCULAR

## 2012-10-24 MED ORDER — PHENYLEPHRINE HCL 2.5 % OP SOLN
1.0000 [drp] | OPHTHALMIC | Status: AC
  Administered 2012-10-24 (×3): 1 [drp] via OPHTHALMIC

## 2012-10-24 SURGICAL SUPPLY — 32 items

## 2012-10-24 NOTE — Anesthesia Postprocedure Evaluation (Signed)
  Anesthesia Post-op Note  Patient: [name]  Procedure(s) Performed: Procedure(s) with comments: CATARACT EXTRACTION PHACO AND INTRAOCULAR LENS PLACEMENT (IOC) (Left) - CDE:16.04  Patient Location: Short Stay  Anesthesia Type:MAC  Level of Consciousness: awake, alert , oriented and patient cooperative  Airway and Oxygen Therapy: Patient Spontanous Breathing  Post-op Pain: none  Post-op Assessment: Post-op Vital signs reviewed, Patient's Cardiovascular Status Stable, Respiratory Function Stable and No signs of Nausea or vomiting  Post-op Vital Signs: Reviewed and stable  Complications: No apparent anesthesia complications

## 2012-10-24 NOTE — Op Note (Signed)
Date of Admission: 10/24/2012  Date of Surgery: 10/24/2012  Pre-Op Dx: Cataract  Left  Eye  Post-Op Dx: Combined Cataract  Left  Eye,  Dx Code 366.19  Surgeon: Gemma Payor, M.D.  Assistants: None  Anesthesia: Topical with MAC  Indications: Painless, progressive loss of vision with compromise of daily activities.  Surgery: Cataract Extraction with Intraocular lens Implant Left Eye  Discription: The patient had dilating drops and viscous lidocaine placed into the left eye in the pre-op holding area. After transfer to the operating room, a time out was performed. The patient was then prepped and draped. Beginning with a 75 degree blade a paracentesis port was made at the surgeon's 2 o'clock position. The anterior chamber was then filled with 1% non-preserved lidocaine. This was followed by filling the anterior chamber with Provisc. A bent cystatome needle was used to create a continuous tear capsulotomy. Hydrodissection was performed with balanced salt solution on a Fine canula. The lens nucleus was then removed using the phacoemulsification handpiece. Residual cortex was removed with the I&A handpiece. The anterior chamber and capsular bag were refilled with Provisc. A posterior chamber intraocular lens was placed into the capsular bag with it's injector. The implant was positioned with the Kuglan hook. The Provisc was then removed from the anterior chamber and capsular bag with the I&A handpiece. Stromal hydration of the main incision and paracentesis port was performed with BSS on a Fine canula. The wounds were tested for leak which was negative. The patient tolerated the procedure well. There were no operative complications. The patient was then transferred to the recovery room in stable condition.  Complications: None  Specimen: None  EBL: None  Prosthetic device: B&L enVista, MX60, power 15.0D, #[id number].

## 2012-10-24 NOTE — H&P (Signed)
I have reviewed the H&P, the patient was re-examined, and I have identified no interval changes in medical condition and plan of care since the history and physical of record  

## 2012-10-24 NOTE — Anesthesia Preprocedure Evaluation (Signed)
Anesthesia Evaluation  Patient identified by MRN, date of birth, ID band Patient awake    Reviewed: Allergy & Precautions, H&P , NPO status , Patient's Chart, lab work & pertinent test results  History of Anesthesia Complications Negative for: history of anesthetic complications  Airway Mallampati: II      Dental  (+) Poor Dentition   Pulmonary neg pulmonary ROS,  breath sounds clear to auscultation        Cardiovascular negative cardio ROS  Rhythm:Regular Rate:Normal     Neuro/Psych CVA, Residual Symptoms    GI/Hepatic negative GI ROS,   Endo/Other    Renal/GU      Musculoskeletal   Abdominal   Peds  Hematology   Anesthesia Other Findings   Reproductive/Obstetrics                           Anesthesia Physical Anesthesia Plan  ASA: III  Anesthesia Plan: MAC   Post-op Pain Management:    Induction: Intravenous  Airway Management Planned: Nasal Cannula  Additional Equipment:   Intra-op Plan:   Post-operative Plan:   Informed Consent: I have reviewed the patients History and Physical, chart, labs and discussed the procedure including the risks, benefits and alternatives for the proposed anesthesia with the patient or authorized representative who has indicated his/her understanding and acceptance.     Plan Discussed with:   Anesthesia Plan Comments:         Anesthesia Quick Evaluation

## 2012-10-24 NOTE — Transfer of Care (Signed)
Immediate Anesthesia Transfer of Care Note  Patient: [name]  Procedure(s) Performed: Procedure(s) with comments: CATARACT EXTRACTION PHACO AND INTRAOCULAR LENS PLACEMENT (IOC) (Left) - CDE:16.04  Patient Location: Short Stay  Anesthesia Type:MAC  Level of Consciousness: awake, alert , oriented and patient cooperative  Airway & Oxygen Therapy: Patient Spontanous Breathing  Post-op Assessment: Report given to PACU RN and Post -op Vital signs reviewed and stable  Post vital signs: Reviewed and stable  Complications: No apparent anesthesia complications

## 2012-10-28 ENCOUNTER — Encounter (HOSPITAL_COMMUNITY): Payer: Self-pay | Admitting: Ophthalmology

## 2012-11-05 ENCOUNTER — Encounter (HOSPITAL_COMMUNITY): Payer: Self-pay | Admitting: Pharmacy Technician

## 2012-11-12 ENCOUNTER — Other Ambulatory Visit (HOSPITAL_COMMUNITY): Payer: Medicare Other

## 2012-11-18 ENCOUNTER — Encounter (HOSPITAL_COMMUNITY): Admission: RE | Payer: Self-pay | Source: Ambulatory Visit

## 2012-11-18 ENCOUNTER — Ambulatory Visit (HOSPITAL_COMMUNITY): Admission: RE | Admit: 2012-11-18 | Payer: Medicare Other | Source: Ambulatory Visit | Admitting: Ophthalmology

## 2012-11-18 SURGERY — PHACOEMULSIFICATION, CATARACT, WITH IOL INSERTION
Anesthesia: Monitor Anesthesia Care | Site: Eye | Laterality: Right

## 2013-03-07 ENCOUNTER — Encounter (HOSPITAL_BASED_OUTPATIENT_CLINIC_OR_DEPARTMENT_OTHER): Payer: Medicare Other | Attending: General Surgery

## 2013-03-07 DIAGNOSIS — I10 Essential (primary) hypertension: Secondary | ICD-10-CM | POA: Insufficient documentation

## 2013-03-07 DIAGNOSIS — Z79899 Other long term (current) drug therapy: Secondary | ICD-10-CM | POA: Insufficient documentation

## 2013-03-07 DIAGNOSIS — L89899 Pressure ulcer of other site, unspecified stage: Secondary | ICD-10-CM | POA: Insufficient documentation

## 2013-03-07 DIAGNOSIS — S98139A Complete traumatic amputation of one unspecified lesser toe, initial encounter: Secondary | ICD-10-CM | POA: Insufficient documentation

## 2013-03-07 DIAGNOSIS — L8992 Pressure ulcer of unspecified site, stage 2: Secondary | ICD-10-CM | POA: Insufficient documentation
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
## 2013-08-07 ENCOUNTER — Other Ambulatory Visit: Payer: Self-pay | Admitting: Gastroenterology

## 2013-08-08 ENCOUNTER — Ambulatory Visit
Admission: RE | Admit: 2013-08-08 | Discharge: 2013-08-08 | Disposition: A | Discharge status: Home / Self Care | Payer: Medicare Other | Source: Ambulatory Visit | Attending: Gastroenterology | Admitting: Gastroenterology

## 2013-08-08 ENCOUNTER — Other Ambulatory Visit: Payer: Self-pay | Admitting: Gastroenterology

## 2013-08-08 DIAGNOSIS — R1084 Generalized abdominal pain: Secondary | ICD-10-CM

## 2014-04-21 ENCOUNTER — Telehealth: Payer: Self-pay | Admitting: Diagnostic Neuroimaging

## 2014-04-21 NOTE — Telephone Encounter (Signed)
Spoke to patient. He states he may have contacted the wrong office. Not showing Dr. Marjory LiesPenumalli has seen patient in the last year as he stated. Checked as far back as 2012 not showing seen here. Advised to have PCP coordinate referral. Patient said they are working on it now. Advised to contact PCP. Patient agreed.

## 2014-04-21 NOTE — Telephone Encounter (Signed)
Pt is calling stating he needs a referral for vertigo.  He states he was referred to someone in [facility] not sure who but wants to go back.  Please call and advise.

## 2014-12-27 ENCOUNTER — Emergency Department (EMERGENCY_DEPARTMENT_HOSPITAL): Admit: 2014-12-27 | Discharge: 2014-12-27 | Disposition: A | Discharge status: Home / Self Care | Payer: Medicare Other

## 2014-12-27 ENCOUNTER — Encounter (HOSPITAL_COMMUNITY): Payer: Self-pay | Admitting: *Deleted

## 2014-12-27 ENCOUNTER — Emergency Department (HOSPITAL_COMMUNITY)
Admission: EM | Admit: 2014-12-27 | Discharge: 2014-12-27 | Disposition: A | Discharge status: Home / Self Care | Payer: Medicare Other | Attending: Emergency Medicine | Admitting: Emergency Medicine

## 2014-12-27 DIAGNOSIS — M79605 Pain in left leg: Secondary | ICD-10-CM | POA: Diagnosis present

## 2014-12-27 DIAGNOSIS — M545 Low back pain: Secondary | ICD-10-CM | POA: Diagnosis not present

## 2014-12-27 DIAGNOSIS — Z79899 Other long term (current) drug therapy: Secondary | ICD-10-CM | POA: Diagnosis not present

## 2014-12-27 DIAGNOSIS — M79609 Pain in unspecified limb: Secondary | ICD-10-CM

## 2014-12-27 DIAGNOSIS — M5430 Sciatica, unspecified side: Secondary | ICD-10-CM | POA: Insufficient documentation

## 2014-12-27 DIAGNOSIS — Z8673 Personal history of transient ischemic attack (TIA), and cerebral infarction without residual deficits: Secondary | ICD-10-CM | POA: Insufficient documentation

## 2014-12-27 MED ORDER — PREDNISONE 20 MG PO TABS: 40.0000 mg | ORAL_TABLET | Freq: Every day | ORAL | Status: DC

## 2014-12-27 MED ORDER — DIAZEPAM 5 MG PO TABS
5.0000 mg | ORAL_TABLET | Freq: Once | ORAL | Status: AC
  Administered 2014-12-27: 5 mg via ORAL
  Filled 2014-12-27: qty 1

## 2014-12-27 MED ORDER — HYDROCODONE-ACETAMINOPHEN 5-325 MG PO TABS: 2.0000 | ORAL_TABLET | ORAL | Status: DC | PRN

## 2014-12-27 MED ORDER — METHOCARBAMOL 500 MG PO TABS: 500.0000 mg | ORAL_TABLET | Freq: Two times a day (BID) | ORAL | Status: DC

## 2014-12-27 MED ORDER — PREDNISONE 20 MG PO TABS
60.0000 mg | ORAL_TABLET | Freq: Once | ORAL | Status: AC
  Administered 2014-12-27: 60 mg via ORAL
  Filled 2014-12-27: qty 3

## 2014-12-27 MED ORDER — HYDROCODONE-ACETAMINOPHEN 5-325 MG PO TABS
2.0000 | ORAL_TABLET | Freq: Once | ORAL | Status: AC
  Administered 2014-12-27: 2 via ORAL
  Filled 2014-12-27: qty 2

## 2014-12-27 NOTE — Discharge Instructions (Signed)
Sciatica Sciatica is pain, weakness, numbness, or tingling along the path of the sciatic nerve. The nerve starts in the lower back and runs down the back of each leg. The nerve controls the muscles in the lower leg and in the back of the knee, while also providing sensation to the back of the thigh, lower leg, and the sole of your foot. Sciatica is a symptom of another medical condition. For instance, nerve damage or certain conditions, such as a herniated disk or bone spur on the spine, pinch or put pressure on the sciatic nerve. This causes the pain, weakness, or other sensations normally associated with sciatica. Generally, sciatica only affects one side of the body. CAUSES   Herniated or slipped disc.  Degenerative disk disease.  A pain disorder involving the narrow muscle in the buttocks (piriformis syndrome).  Pelvic injury or fracture.  Pregnancy.  Tumor (rare). SYMPTOMS  Symptoms can vary from mild to very severe. The symptoms usually travel from the low back to the buttocks and down the back of the leg. Symptoms can include:  Mild tingling or dull aches in the lower back, leg, or hip.  Numbness in the back of the calf or sole of the foot.  Burning sensations in the lower back, leg, or hip.  Sharp pains in the lower back, leg, or hip.  Leg weakness.  Severe back pain inhibiting movement. These symptoms may get worse with coughing, sneezing, laughing, or prolonged sitting or standing. Also, being overweight may worsen symptoms. DIAGNOSIS  Your caregiver will perform a physical exam to look for common symptoms of sciatica. He or she may ask you to do certain movements or activities that would trigger sciatic nerve pain. Other tests may be performed to find the cause of the sciatica. These may include:  Blood tests.  X-rays.  Imaging tests, such as an MRI or CT scan. TREATMENT  Treatment is directed at the cause of the sciatic pain. Sometimes, treatment is not necessary  and the pain and discomfort goes away on its own. If treatment is needed, your caregiver may suggest:  Over-the-counter medicines to relieve pain.  Prescription medicines, such as anti-inflammatory medicine, muscle relaxants, or narcotics.  Applying heat or ice to the painful area.  Steroid injections to lessen pain, irritation, and inflammation around the nerve.  Reducing activity during periods of pain.  Exercising and stretching to strengthen your abdomen and improve flexibility of your spine. Your caregiver may suggest losing weight if the extra weight makes the back pain worse.  Physical therapy.  Surgery to eliminate what is pressing or pinching the nerve, such as a bone spur or part of a herniated disk. HOME CARE INSTRUCTIONS   Only take over-the-counter or prescription medicines for pain or discomfort as directed by your caregiver.  Apply ice to the affected area for 20 minutes, 3-4 times a day for the first 48-72 hours. Then try heat in the same way.  Exercise, stretch, or perform your usual activities if these do not aggravate your pain.  Attend physical therapy sessions as directed by your caregiver.  Keep all follow-up appointments as directed by your caregiver.  Do not wear high heels or shoes that do not provide proper support.  Check your mattress to see if it is too soft. A firm mattress may lessen your pain and discomfort. SEEK IMMEDIATE MEDICAL CARE IF:   You lose control of your bowel or bladder (incontinence).  You have increasing weakness in the lower back, pelvis, buttocks,   or legs.  You have redness or swelling of your back.  You have a burning sensation when you urinate.  You have pain that gets worse when you lie down or awakens you at night.  Your pain is worse than you have experienced in the past.  Your pain is lasting longer than 4 weeks.  You are suddenly losing weight without reason. MAKE SURE YOU:  Understand these  instructions.  Will watch your condition.  Will get help right away if you are not doing well or get worse. Document Released: 04/04/2001 Document Revised: 10/10/2011 Document Reviewed: 08/20/2011 [employer] Patient Information 2015 [employer]. This information is not intended to replace advice given to you by your health care provider. Make sure you discuss any questions you have with your health care provider.  

## 2014-12-27 NOTE — Progress Notes (Signed)
VASCULAR LAB PRELIMINARY  PRELIMINARY  PRELIMINARY  PRELIMINARY  Left lower extremity venous duplex completed.    Preliminary report:  There is no DVT or SVT noted in the left lower extremity.   [name], RVT 12/27/2014, 6:40 PM

## 2014-12-27 NOTE — ED Notes (Signed)
Pt c/o pain that began in L hip/lower back, but now is mostly in his L calf.  Pt states nothing improves pain.
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
## 2015-10-28 ENCOUNTER — Encounter (HOSPITAL_BASED_OUTPATIENT_CLINIC_OR_DEPARTMENT_OTHER): Payer: Self-pay

## 2015-10-28 ENCOUNTER — Emergency Department (HOSPITAL_BASED_OUTPATIENT_CLINIC_OR_DEPARTMENT_OTHER)
Admission: EM | Admit: 2015-10-28 | Discharge: 2015-10-28 | Disposition: A | Discharge status: Home / Self Care | Payer: Medicare Other | Attending: Emergency Medicine | Admitting: Emergency Medicine

## 2015-10-28 ENCOUNTER — Emergency Department (HOSPITAL_BASED_OUTPATIENT_CLINIC_OR_DEPARTMENT_OTHER): Payer: Medicare Other

## 2015-10-28 DIAGNOSIS — Z8673 Personal history of transient ischemic attack (TIA), and cerebral infarction without residual deficits: Secondary | ICD-10-CM | POA: Insufficient documentation

## 2015-10-28 DIAGNOSIS — H538 Other visual disturbances: Secondary | ICD-10-CM | POA: Diagnosis not present

## 2015-10-28 DIAGNOSIS — Z79899 Other long term (current) drug therapy: Secondary | ICD-10-CM | POA: Diagnosis not present

## 2015-10-28 DIAGNOSIS — R42 Dizziness and giddiness: Secondary | ICD-10-CM | POA: Diagnosis not present

## 2015-10-28 DIAGNOSIS — Z5181 Encounter for therapeutic drug level monitoring: Secondary | ICD-10-CM | POA: Diagnosis not present

## 2015-10-28 DIAGNOSIS — R51 Headache: Secondary | ICD-10-CM | POA: Diagnosis present

## 2015-10-28 DIAGNOSIS — R519 Headache, unspecified: Secondary | ICD-10-CM

## 2015-10-28 LAB — URINALYSIS, ROUTINE W REFLEX MICROSCOPIC
Bilirubin Urine: NEGATIVE
Glucose, UA: NEGATIVE mg/dL
Hgb urine dipstick: NEGATIVE
Ketones, ur: NEGATIVE mg/dL
Leukocytes, UA: NEGATIVE
Nitrite: NEGATIVE
Protein, ur: NEGATIVE mg/dL
Specific Gravity, Urine: 1.016 (ref 1.005–1.030)
pH: 7.5 (ref 5.0–8.0)

## 2015-10-28 LAB — CBC
HCT: 41.4 % (ref 39.0–52.0)
Hemoglobin: 14 g/dL (ref 13.0–17.0)
MCH: 32.5 pg (ref 26.0–34.0)
MCHC: 33.8 g/dL (ref 30.0–36.0)
MCV: 96.1 fL (ref 78.0–100.0)
Platelets: 215 K/uL (ref 150–400)
RBC: 4.31 MIL/uL (ref 4.22–5.81)
RDW: 13.4 % (ref 11.5–15.5)
WBC: 9.3 K/uL (ref 4.0–10.5)

## 2015-10-28 LAB — RAPID URINE DRUG SCREEN, HOSP PERFORMED
Amphetamines: NOT DETECTED
Barbiturates: NOT DETECTED
Benzodiazepines: NOT DETECTED
Cocaine: NOT DETECTED
Opiates: NOT DETECTED
Tetrahydrocannabinol: NOT DETECTED

## 2015-10-28 LAB — COMPREHENSIVE METABOLIC PANEL WITH GFR
ALT: 17 U/L (ref 17–63)
AST: 22 U/L (ref 15–41)
Albumin: 3.4 g/dL — ABNORMAL LOW (ref 3.5–5.0)
Alkaline Phosphatase: 47 U/L (ref 38–126)
Anion gap: 6 (ref 5–15)
BUN: 13 mg/dL (ref 6–20)
CO2: 25 mmol/L (ref 22–32)
Calcium: 8.6 mg/dL — ABNORMAL LOW (ref 8.9–10.3)
Chloride: 107 mmol/L (ref 101–111)
Creatinine, Ser: 0.84 mg/dL (ref 0.61–1.24)
GFR calc Af Amer: >60 mL/min
GFR calc non Af Amer: >60 mL/min
Glucose, Bld: 97 mg/dL (ref 65–99)
Potassium: 3.6 mmol/L (ref 3.5–5.1)
Sodium: 138 mmol/L (ref 135–145)
Total Bilirubin: 0.7 mg/dL (ref 0.3–1.2)
Total Protein: 5.9 g/dL — ABNORMAL LOW (ref 6.5–8.1)

## 2015-10-28 LAB — ETHANOL: Alcohol, Ethyl (B): <5 mg/dL

## 2015-10-28 LAB — DIFFERENTIAL
Basophils Absolute: 0 K/uL (ref 0.0–0.1)
Basophils Relative: 0 %
Eosinophils Absolute: 0.5 K/uL (ref 0.0–0.7)
Eosinophils Relative: 6 %
Lymphocytes Relative: 36 %
Lymphs Abs: 3.3 K/uL (ref 0.7–4.0)
Monocytes Absolute: 0.8 K/uL (ref 0.1–1.0)
Monocytes Relative: 8 %
Neutro Abs: 4.6 K/uL (ref 1.7–7.7)
Neutrophils Relative %: 50 %

## 2015-10-28 LAB — PROTIME-INR
INR: 0.97 (ref 0.00–1.49)
Prothrombin Time: 13.1 s (ref 11.6–15.2)

## 2015-10-28 LAB — TROPONIN I: Troponin I: <0.03 ng/mL (ref <0.03)

## 2015-10-28 LAB — APTT: aPTT: 31 s (ref 24–37)

## 2015-10-28 NOTE — ED Provider Notes (Signed)
Medical screening examination/treatment/procedure(s) were conducted as a shared visit with non-physician practitioner(s) and myself.  I personally evaluated the patient during the encounter.   EKG Interpretation   Date/Time:  Thursday October 28 2015 13:53:18 EDT Ventricular Rate:  58 PR Interval:    QRS Duration: 122 QT Interval:  447 QTC Calculation: 439 R Axis:   -14 Text Interpretation:  Sinus rhythm Nonspecific intraventricular conduction  delay No significant change since last tracing Confirmed by Janyth Riera MD, Emil Klassen  [phone]([id number]) on 10/28/2015 2:03:2443 PM      75 year old male with history of vertigo, prior CVA who presents with headache and dizziness, now resolved. States that for the past 2-3 days he has been having intermittent frontal headaches throbbing in nature. Associated with some intermittent blurry vision, which she has had before in the past. Also history of BPPV, and yesterday afternoon had similar flareup, feeling "woozy," like the room was spinning, and it was associated with nausea. States that it was consistent with his prior history of vertigo, he laid down and went to sleep, later that on that evening symptoms resolved. He was at his physical therapy appointment this morning, and discussed with him his symptoms over the past few days. They recommended that he get evaluation for potential stroke. He was referred to the ED by his PCP. Denies any associating double vision, difficulty swallowing, slurring of his speech or word finding difficulties, falls, focal numbness or weakness. Currently denies headache, vertigo, or blurry vision. He has normal neurological exam. Able to ambulate steadily, without dysmetria, and without abnormal test of skew or bidirectional/vertical nystagmus. No concern for acute CVA at this time. CT scan head is unremarkable. Remainder of blood work and EKG are unremarkable. Low suspicion that his symptoms are due to stroke. Did briefly discussed with neurology who do  not feel that he requires any further workup.  Lavera Guiseana Duo Ha Placeres, MD 10/28/15 [phone]

## 2015-10-28 NOTE — Discharge Instructions (Signed)
Benign Positional Vertigo °Vertigo is the feeling that you or your surroundings are moving when they are not. Benign positional vertigo is the most common form of vertigo. The cause of this condition is not serious (is benign). This condition is triggered by certain movements and positions (is positional). This condition can be dangerous if it occurs while you are doing something that could endanger you or others, such as driving.  °CAUSES °In many cases, the cause of this condition is not known. It may be caused by a disturbance in an area of the inner ear that helps your brain to sense movement and balance. This disturbance can be caused by a viral infection (labyrinthitis), head injury, or repetitive motion. °RISK FACTORS °This condition is more likely to develop in: °· Women. °· People who are 50 years of age or older. °SYMPTOMS °Symptoms of this condition usually happen when you move your head or your eyes in different directions. Symptoms may start suddenly, and they usually last for less than a minute. Symptoms may include: °· Loss of balance and falling. °· Feeling like you are spinning or moving. °· Feeling like your surroundings are spinning or moving. °· Nausea and vomiting. °· Blurred vision. °· Dizziness. °· Involuntary eye movement (nystagmus). °Symptoms can be mild and cause only slight annoyance, or they can be severe and interfere with daily life. Episodes of benign positional vertigo may return (recur) over time, and they may be triggered by certain movements. Symptoms may improve over time. °DIAGNOSIS °This condition is usually diagnosed by medical history and a physical exam of the head, neck, and ears. You may be referred to a health care provider who specializes in ear, nose, and throat (ENT) problems (otolaryngologist) or a provider who specializes in disorders of the nervous system (neurologist). You may have additional testing, including: °· MRI. °· A CT scan. °· Eye movement tests. Your  health care provider may ask you to change positions quickly while he or she watches you for symptoms of benign positional vertigo, such as nystagmus. Eye movement may be tested with an electronystagmogram (ENG), caloric stimulation, the Dix-Hallpike test, or the roll test. °· An electroencephalogram (EEG). This records electrical activity in your brain. °· Hearing tests. °TREATMENT °Usually, your health care provider will treat this by moving your head in specific positions to adjust your inner ear back to normal. Surgery may be needed in severe cases, but this is rare. In some cases, benign positional vertigo may resolve on its own in 2-4 weeks. °HOME CARE INSTRUCTIONS °Safety °· Move slowly. Avoid sudden body or head movements. °· Avoid driving. °· Avoid operating heavy machinery. °· Avoid doing any tasks that would be dangerous to you or others if a vertigo episode would occur. °· If you have trouble walking or keeping your balance, try using a cane for stability. If you feel dizzy or unstable, sit down right away. °· Return to your normal activities as told by your health care provider. Ask your health care provider what activities are safe for you. °General Instructions °· Take over-the-counter and prescription medicines only as told by your health care provider. °· Avoid certain positions or movements as told by your health care provider. °· Drink enough fluid to keep your urine clear or pale yellow. °· Keep all follow-up visits as told by your health care provider. This is important. °SEEK MEDICAL CARE IF: °· You have a fever. °· Your condition gets worse or you develop new symptoms. °· Your family or friends   notice any behavioral changes.  Your nausea or vomiting gets worse.  You have numbness or a "pins and needles" sensation. SEEK IMMEDIATE MEDICAL CARE IF:  You have difficulty speaking or moving.  You are always dizzy.  You faint.  You develop severe headaches.  You have weakness in your  legs or arms.  You have changes in your hearing or vision.  You develop a stiff neck.  You develop sensitivity to light.   This information is not intended to replace advice given to you by your health care provider. Make sure you discuss any questions you have with your health care provider.   Document Released: 01/16/2006 Document Revised: 12/30/2014 Document Reviewed: 08/03/2014 Elsevier Interactive Patient Education 2016 [employer]  General Headache Without Cause A headache is pain or discomfort felt around the head or neck area. There are many causes and types of headaches. In some cases, the cause may not be found.  HOME CARE  Managing Pain  Take over-the-counter and prescription medicines only as told by your doctor.  Lie down in a dark, quiet room when you have a headache.  If directed, apply ice to the head and neck area:  Put ice in a plastic bag.  Place a towel between your skin and the bag.  Leave the ice on for 20 minutes, 2-3 times per day.  Use a heating pad or hot shower to apply heat to the head and neck area as told by your doctor.  Keep lights dim if bright lights bother you or make your headaches worse. Eating and Drinking  Eat meals on a regular schedule.  Lessen how much alcohol you drink.  Lessen how much caffeine you drink, or stop drinking caffeine. General Instructions  Keep all follow-up visits as told by your doctor. This is important.  Keep a journal to find out if certain things bring on headaches. For example, write down:  What you eat and drink.  How much sleep you get.  Any change to your diet or medicines.  Relax by getting a massage or doing other relaxing activities.  Lessen stress.  Sit up straight. Do not tighten (tense) your muscles.  Do not use tobacco products. This includes cigarettes, chewing tobacco, or e-cigarettes. If you need help quitting, ask your doctor.  Exercise regularly as told by your doctor.  Get  enough sleep. This often means 7-9 hours of sleep. GET HELP IF:  Your symptoms are not helped by medicine.  You have a headache that feels different than the other headaches.  You feel sick to your stomach (nauseous) or you throw up (vomit).  You have a fever. GET HELP RIGHT AWAY IF:   Your headache becomes really bad.  You keep throwing up.  You have a stiff neck.  You have trouble seeing.  You have trouble speaking.  You have pain in the eye or ear.  Your muscles are weak or you lose muscle control.  You lose your balance or have trouble walking.  You feel like you will pass out (faint) or you pass out.  You have confusion.   This information is not intended to replace advice given to you by your health care provider. Make sure you discuss any questions you have with your health care provider.   Follow-up with your primary care doctor if your symptoms do not improve. He may return to the physical therapy without any difficulty or restrictions. Return to the emergency department if you experience weakness in an  extremity, numbness or tingling in extremity, change in your vision, slurring of her speech, facial drooping, inability to walk.

## 2015-10-28 NOTE — ED Notes (Addendum)
C/o HA x 4 days-vertigo x 3 days-presented to PCP today and advised to come to ED-NAD-limping gait braces to both knees and uses cane-baseline-NAD-A/O-answering all questions appropriately-wife with pt
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
## 2015-10-28 NOTE — ED Notes (Signed)
Pt wants to wait to have IV started until EDP has seen him.

## 2016-01-13 NOTE — Patient Instructions (Signed)
Your procedure is scheduled on:   01/20/2016              Report to [facility] at   8:00  AM.  Call this number if you have problems the morning of surgery: [phone]   Remember:   Do not eat or drink :After Midnight.    Take these medicines the morning of surgery with A SIP OF WATER:       Inderal, Avodart, Flonase and hydrocodone     Do not wear jewelry, make-up or nail polish.  Do not wear lotions, powders, or perfumes. You may wear deodorant.  Do not bring valuables to the hospital.  Contacts, dentures or bridgework may not be worn into surgery.  Patients discharged the day of surgery will not be allowed to drive home.  Name and phone number of your driver:    @[email or web address] @ Cataract Surgery  A cataract is a clouding of the lens of the eye. When a lens becomes cloudy, vision is reduced based on the degree and nature of the clouding. Surgery may be needed to improve vision. Surgery removes the cloudy lens and usually replaces it with a substitute lens (intraocular lens, IOL). LET YOUR EYE DOCTOR KNOW ABOUT:  Allergies to food or medicine.   Medicines taken including herbs, eyedrops, over-the-counter medicines, and creams.   Use of steroids (by mouth or creams).   Previous problems with anesthetics or numbing medicine.   History of bleeding problems or blood clots.   Previous surgery.   Other health problems, including diabetes and kidney problems.   Possibility of pregnancy, if this applies.  RISKS AND COMPLICATIONS  Infection.   Inflammation of the eyeball (endophthalmitis) that can spread to both eyes (sympathetic ophthalmia).   Poor wound healing.   If an IOL is inserted, it can later fall out of proper position. This is very uncommon.   Clouding of the part of your eye that holds an IOL in place. This is called an "after-cataract." These are uncommon, but easily treated.  BEFORE THE PROCEDURE  Do not eat or drink anything except small amounts of water  for 8 to 12 before your surgery, or as directed by your caregiver.   Unless you are told otherwise, continue any eyedrops you have been prescribed.   Talk to your primary caregiver about all other medicines that you take (both prescription and non-prescription). In some cases, you may need to stop or change medicines near the time of your surgery. This is most important if you are taking blood-thinning medicine.Do not stop medicines unless you are told to do so.   Arrange for someone to drive you to and from the procedure.   Do not put contact lenses in either eye on the day of your surgery.  PROCEDURE There is more than one method for safely removing a cataract. Your doctor can explain the differences and help determine which is best for you. Phacoemulsification surgery is the most common form of cataract surgery.  An injection is given behind the eye or eyedrops are given to make this a painless procedure.   A small cut (incision) is made on the edge of the clear, dome-shaped surface that covers the front of the eye (cornea).   A tiny probe is painlessly inserted into the eye. This device gives off ultrasound waves that soften and break up the cloudy center of the lens. This makes it easier for the cloudy lens to be removed by suction.   An  IOL may be implanted.   The normal lens of the eye is covered by a clear capsule. Part of that capsule is intentionally left in the eye to support the IOL.   Your surgeon may or may not use stitches to close the incision.  There are other forms of cataract surgery that require a larger incision and stiches to close the eye. This approach is taken in cases where the doctor feels that the cataract cannot be easily removed using phacoemulsification. AFTER THE PROCEDURE  When an IOL is implanted, it does not need care. It becomes a permanent part of your eye and cannot be seen or felt.   Your doctor will schedule follow-up exams to check on your  progress.   Review your other medicines with your doctor to see which can be resumed after surgery.   Use eyedrops or take medicine as prescribed by your doctor.  Document Released: 03/30/2011 Document Reviewed: 03/27/2011 [facility] Patient Information 2012 [place].  .Cataract Surgery Care After Refer to this sheet in the next few weeks. These instructions provide you with information on caring for yourself after your procedure. Your caregiver may also give you more specific instructions. Your treatment has been planned according to current medical practices, but problems sometimes occur. Call your caregiver if you have any problems or questions after your procedure.  HOME CARE INSTRUCTIONS   Avoid strenuous activities as directed by your caregiver.   Ask your caregiver when you can resume driving.   Use eyedrops or other medicines to help healing and control pressure inside your eye as directed by your caregiver.   Only take over-the-counter or prescription medicines for pain, discomfort, or fever as directed by your caregiver.   Do not to touch or rub your eyes.   You may be instructed to use a protective shield during the first few days and nights after surgery. If not, wear sunglasses to protect your eyes. This is to protect the eye from pressure or from being accidentally bumped.   Keep the area around your eye clean and dry. Avoid swimming or allowing water to hit you directly in the face while showering. Keep soap and shampoo out of your eyes.   Do not bend or lift heavy objects. Bending increases pressure in the eye. You can walk, climb stairs, and do light household chores.   Do not put a contact lens into the eye that had surgery until your caregiver says it is okay to do so.   Ask your doctor when you can return to work. This will depend on the kind of work that you do. If you work in a dusty environment, you may be advised to wear protective eyewear for a period of  time.   Ask your caregiver when it will be safe to engage in sexual activity.   Continue with your regular eye exams as directed by your caregiver.  What to expect:  It is normal to feel itching and mild discomfort for a few days after cataract surgery. Some fluid discharge is also common, and your eye may be sensitive to light and touch.   After 1 to 2 days, even moderate discomfort should disappear. In most cases, healing will take about 6 weeks.   If you received an intraocular lens (IOL), you may notice that colors are very bright or have a blue tinge. Also, if you have been in bright sunlight, everything may appear reddish for a few hours. If you see these color tinges,  it is because your lens is clear and no longer cloudy. Within a few months after receiving an IOL, these extra colors should go away. When you have healed, you will probably need new glasses.  SEEK MEDICAL CARE IF:   You have increased bruising around your eye.   You have discomfort not helped by medicine.  SEEK IMMEDIATE MEDICAL CARE IF:   You have a fever.   You have a worsening or sudden vision loss.   You have redness, swelling, or increasing pain in the eye.   You have a thick discharge from the eye that had surgery.  MAKE SURE YOU:  Understand these instructions.   Will watch your condition.   Will get help right away if you are not doing well or get worse.  Document Released: 10/28/2004 Document Revised: 03/30/2011 Document Reviewed: 12/02/2010 [facility] Patient Information 2012 [place].    Monitored Anesthesia Care  Monitored anesthesia care is an anesthesia service for a medical procedure. Anesthesia is the loss of the ability to feel pain. It is produced by medications called anesthetics. It may affect a small area of your body (local anesthesia), a large area of your body (regional anesthesia), or your entire body (general anesthesia). The need for monitored anesthesia care depends your  procedure, your condition, and the potential need for regional or general anesthesia. It is often provided during procedures where:   General anesthesia may be needed if there are complications. This is because you need special care when you are under general anesthesia.   You will be under local or regional anesthesia. This is so that you are able to have higher levels of anesthesia if needed.   You will receive calming medications (sedatives). This is especially the case if sedatives are given to put you in a semi-conscious state of relaxation (deep sedation). This is because the amount of sedative needed to produce this state can be hard to predict. Too much of a sedative can produce general anesthesia. Monitored anesthesia care is performed by one or more caregivers who have special training in all types of anesthesia. You will need to meet with these caregivers before your procedure. During this meeting, they will ask you about your medical history. They will also give you instructions to follow. (For example, you will need to stop eating and drinking before your procedure. You may also need to stop or change medications you are taking.) During your procedure, your caregivers will stay with you. They will:   Watch your condition. This includes watching you blood pressure, breathing, and level of pain.   Diagnose and treat problems that occur.   Give medications if they are needed. These may include calming medications (sedatives) and anesthetics.   Make sure you are comfortable.  Having monitored anesthesia care does not necessarily mean that you will be under anesthesia. It does mean that your caregivers will be able to manage anesthesia if you need it or if it occurs. It also means that you will be able to have a different type of anesthesia than you are having if you need it. When your procedure is complete, your caregivers will continue to watch your condition. They will make sure any  medications wear off before you are allowed to go home.  Document Released: 01/04/2005 Document Revised: 08/05/2012 Document Reviewed: 05/22/2012 [facility] Patient Information 2014 [place], [place].

## 2016-01-14 ENCOUNTER — Encounter (HOSPITAL_COMMUNITY): Payer: Self-pay

## 2016-01-14 ENCOUNTER — Encounter (HOSPITAL_COMMUNITY)
Admission: RE | Admit: 2016-01-14 | Discharge: 2016-01-14 | Disposition: A | Discharge status: Home / Self Care | Payer: Medicare Other | Source: Ambulatory Visit | Attending: Ophthalmology | Admitting: Ophthalmology

## 2016-01-14 DIAGNOSIS — Z01812 Encounter for preprocedural laboratory examination: Secondary | ICD-10-CM | POA: Diagnosis present

## 2016-01-14 DIAGNOSIS — Z880 Allergy status to penicillin: Secondary | ICD-10-CM | POA: Insufficient documentation

## 2016-01-14 DIAGNOSIS — H2511 Age-related nuclear cataract, right eye: Secondary | ICD-10-CM | POA: Diagnosis not present

## 2016-01-14 DIAGNOSIS — Z886 Allergy status to analgesic agent status: Secondary | ICD-10-CM | POA: Insufficient documentation

## 2016-01-14 HISTORY — DX: Essential (primary) hypertension: I10

## 2016-01-14 HISTORY — DX: Unspecified osteoarthritis, unspecified site: M19.90

## 2016-01-14 LAB — CBC
HCT: 43.6 % (ref 39.0–52.0)
Hemoglobin: 14.4 g/dL (ref 13.0–17.0)
MCH: 32.1 pg (ref 26.0–34.0)
MCHC: 33 g/dL (ref 30.0–36.0)
MCV: 97.1 fL (ref 78.0–100.0)
Platelets: 255 10*3/uL (ref 150–400)
RBC: 4.49 MIL/uL (ref 4.22–5.81)
RDW: 13.3 % (ref 11.5–15.5)
WBC: 8.8 10*3/uL (ref 4.0–10.5)

## 2016-01-14 LAB — BASIC METABOLIC PANEL
Anion gap: 5 (ref 5–15)
BUN: 17 mg/dL (ref 6–20)
CO2: 25 mmol/L (ref 22–32)
Calcium: 9 mg/dL (ref 8.9–10.3)
Chloride: 110 mmol/L (ref 101–111)
Creatinine, Ser: 0.91 mg/dL (ref 0.61–1.24)
GFR calc Af Amer: >60 mL/min (ref >60)
GFR calc non Af Amer: >60 mL/min (ref >60)
Glucose, Bld: 91 mg/dL (ref 65–99)
Potassium: 3.7 mmol/L (ref 3.5–5.1)
Sodium: 140 mmol/L (ref 135–145)

## 2016-01-14 NOTE — Pre-Procedure Instructions (Signed)
Patient given information to sign up for my chart at home. 

## 2016-01-20 ENCOUNTER — Ambulatory Visit (HOSPITAL_COMMUNITY): Payer: Medicare Other | Admitting: Anesthesiology

## 2016-01-20 ENCOUNTER — Ambulatory Visit (HOSPITAL_COMMUNITY)
Admission: RE | Admit: 2016-01-20 | Discharge: 2016-01-20 | Disposition: A | Discharge status: Home / Self Care | Payer: Medicare Other | Source: Ambulatory Visit | Attending: Ophthalmology | Admitting: Ophthalmology

## 2016-01-20 ENCOUNTER — Encounter (HOSPITAL_COMMUNITY)
Admission: RE | Disposition: A | Discharge status: Home / Self Care | Payer: Self-pay | Source: Ambulatory Visit | Attending: Ophthalmology

## 2016-01-20 ENCOUNTER — Encounter (HOSPITAL_COMMUNITY): Payer: Self-pay | Admitting: Ophthalmology

## 2016-01-20 DIAGNOSIS — H2511 Age-related nuclear cataract, right eye: Secondary | ICD-10-CM | POA: Diagnosis not present

## 2016-01-20 DIAGNOSIS — I10 Essential (primary) hypertension: Secondary | ICD-10-CM | POA: Diagnosis not present

## 2016-01-20 DIAGNOSIS — Z8673 Personal history of transient ischemic attack (TIA), and cerebral infarction without residual deficits: Secondary | ICD-10-CM | POA: Diagnosis not present

## 2016-01-20 HISTORY — PX: CATARACT EXTRACTION W/PHACO: SHX586

## 2016-01-20 SURGERY — PHACOEMULSIFICATION, CATARACT, WITH IOL INSERTION
Anesthesia: Monitor Anesthesia Care | Site: Eye | Laterality: Right

## 2016-01-20 MED ORDER — EPINEPHRINE HCL 1 MG/ML IJ SOLN
INTRAMUSCULAR | Status: AC
  Filled 2016-01-20: qty 1

## 2016-01-20 MED ORDER — LIDOCAINE 3.5 % OP GEL OPTIME - NO CHARGE
OPHTHALMIC | Status: DC | PRN
  Administered 2016-01-20: 1 [drp] via OPHTHALMIC

## 2016-01-20 MED ORDER — POVIDONE-IODINE 5 % OP SOLN
OPHTHALMIC | Status: DC | PRN
  Administered 2016-01-20: 1 via OPHTHALMIC

## 2016-01-20 MED ORDER — LIDOCAINE HCL 3.5 % OP GEL
1.0000 "application " | Freq: Once | OPHTHALMIC | Status: AC
  Administered 2016-01-20: 1 via OPHTHALMIC

## 2016-01-20 MED ORDER — PHENYLEPHRINE HCL 2.5 % OP SOLN
1.0000 [drp] | OPHTHALMIC | Status: AC
  Administered 2016-01-20 (×3): 1 [drp] via OPHTHALMIC

## 2016-01-20 MED ORDER — LACTATED RINGERS IV SOLN
INTRAVENOUS | Status: DC
  Administered 2016-01-20: 09:00:00 via INTRAVENOUS

## 2016-01-20 MED ORDER — NEOMYCIN-POLYMYXIN-DEXAMETH 3.5-10000-0.1 OP SUSP
OPHTHALMIC | Status: DC | PRN
  Administered 2016-01-20: 2 [drp] via OPHTHALMIC

## 2016-01-20 MED ORDER — LIDOCAINE HCL (PF) 1 % IJ SOLN
INTRAOCULAR | Status: DC | PRN
  Administered 2016-01-20: 09:00:00 via OPHTHALMIC

## 2016-01-20 MED ORDER — CYCLOPENTOLATE-PHENYLEPHRINE 0.2-1 % OP SOLN
1.0000 [drp] | OPHTHALMIC | Status: AC
  Administered 2016-01-20 (×3): 1 [drp] via OPHTHALMIC

## 2016-01-20 MED ORDER — MIDAZOLAM HCL 2 MG/2ML IJ SOLN
1.0000 mg | INTRAMUSCULAR | Status: DC | PRN
  Administered 2016-01-20: 2 mg via INTRAVENOUS
  Filled 2016-01-20: qty 2

## 2016-01-20 MED ORDER — TETRACAINE HCL 0.5 % OP SOLN
1.0000 [drp] | OPHTHALMIC | Status: AC
  Administered 2016-01-20 (×3): 1 [drp] via OPHTHALMIC

## 2016-01-20 MED ORDER — EPINEPHRINE HCL 1 MG/ML IJ SOLN
INTRAOCULAR | Status: DC | PRN
  Administered 2016-01-20: 500 mL

## 2016-01-20 MED ORDER — PROVISC 10 MG/ML IO SOLN
INTRAOCULAR | Status: DC | PRN
  Administered 2016-01-20: 0.85 mL via INTRAOCULAR

## 2016-01-20 MED ORDER — FENTANYL CITRATE (PF) 100 MCG/2ML IJ SOLN
25.0000 ug | INTRAMUSCULAR | Status: DC | PRN
  Administered 2016-01-20: 25 ug via INTRAVENOUS
  Filled 2016-01-20: qty 2

## 2016-01-20 MED ORDER — BSS IO SOLN
INTRAOCULAR | Status: DC | PRN
  Administered 2016-01-20: 15 mL via INTRAOCULAR

## 2016-01-20 SURGICAL SUPPLY — 23 items
CAPSULAR TENSION RING-AMO (OPHTHALMIC RELATED) IMPLANT
CLOTH BEACON ORANGE TIMEOUT ST (SAFETY) ×2 IMPLANT
EYE SHIELD UNIVERSAL CLEAR (GAUZE/BANDAGES/DRESSINGS) ×2 IMPLANT
GLOVE BIOGEL PI IND STRL 6.5 (GLOVE) ×1 IMPLANT
GLOVE BIOGEL PI IND STRL 7.0 (GLOVE) IMPLANT
GLOVE BIOGEL PI INDICATOR 6.5 (GLOVE) ×1
GLOVE BIOGEL PI INDICATOR 7.0 (GLOVE)
GLOVE EXAM NITRILE LRG STRL (GLOVE) IMPLANT
GLOVE EXAM NITRILE MD LF STRL (GLOVE) ×2 IMPLANT
KIT VITRECTOMY (OPHTHALMIC RELATED) IMPLANT
PAD ARMBOARD 7.5X6 YLW CONV (MISCELLANEOUS) ×2 IMPLANT
PROC W NO LENS (INTRAOCULAR LENS)
PROC W SPEC LENS (INTRAOCULAR LENS)
PROCESS W NO LENS (INTRAOCULAR LENS) IMPLANT
PROCESS W SPEC LENS (INTRAOCULAR LENS) IMPLANT
RETRACTOR IRIS SIGHTPATH (OPHTHALMIC RELATED) IMPLANT
RING MALYGIN (MISCELLANEOUS) IMPLANT
SIGHTPATH CAT PROC W REG LENS (Ophthalmic Related) ×2 IMPLANT
SYRINGE LUER LOK 1CC (MISCELLANEOUS) ×2 IMPLANT
TAPE SURG TRANSPORE 1 IN (GAUZE/BANDAGES/DRESSINGS) ×1 IMPLANT
TAPE SURGICAL TRANSPORE 1 IN (GAUZE/BANDAGES/DRESSINGS) ×1
VISCOELASTIC ADDITIONAL (OPHTHALMIC RELATED) IMPLANT
WATER STERILE IRR 250ML POUR (IV SOLUTION) IMPLANT

## 2016-01-20 NOTE — H&P (Signed)
I have reviewed the H&P, the patient was re-examined, and I have identified no interval changes in medical condition and plan of care since the history and physical of record  

## 2016-01-20 NOTE — Anesthesia Procedure Notes (Signed)
Procedure Name: MAC Date/Time: 01/20/2016 9:21 AM Performed by: Franco NonesYATES, Laverle Pillard S Pre-anesthesia Checklist: Patient identified, Emergency Drugs available, Suction available, Timeout performed and Patient being monitored Patient Re-evaluated:Patient Re-evaluated prior to inductionOxygen Delivery Method: Nasal Cannula

## 2016-01-20 NOTE — Anesthesia Preprocedure Evaluation (Addendum)
Anesthesia Evaluation  Patient identified by MRN, date of birth, ID band Patient awake    Reviewed: Allergy & Precautions, H&P , NPO status , Patient's Chart, lab work & pertinent test results, reviewed documented beta blocker date and time   History of Anesthesia Complications Negative for: history of anesthetic complications  Airway Mallampati: II       Dental  (+) Poor Dentition   Pulmonary neg pulmonary ROS,    breath sounds clear to auscultation       Cardiovascular hypertension, Pt. on medications and Pt. on home beta blockers negative cardio ROS   Rhythm:Regular Rate:Normal     Neuro/Psych CVA, Residual Symptoms    GI/Hepatic negative GI ROS,   Endo/Other    Renal/GU      Musculoskeletal   Abdominal   Peds  Hematology   Anesthesia Other Findings   Reproductive/Obstetrics                             Anesthesia Physical Anesthesia Plan  ASA: III  Anesthesia Plan: MAC   Post-op Pain Management:    Induction: Intravenous  Airway Management Planned: Nasal Cannula  Additional Equipment:   Intra-op Plan:   Post-operative Plan:   Informed Consent: I have reviewed the patients History and Physical, chart, labs and discussed the procedure including the risks, benefits and alternatives for the proposed anesthesia with the patient or authorized representative who has indicated his/her understanding and acceptance.     Plan Discussed with:   Anesthesia Plan Comments:         Anesthesia Quick Evaluation

## 2016-01-20 NOTE — Transfer of Care (Signed)
Immediate Anesthesia Transfer of Care Note  Patient: [name]  Procedure(s) Performed: Procedure(s) (LRB): CATARACT EXTRACTION PHACO AND INTRAOCULAR LENS PLACEMENT; CDE:  12.54 (Right)  Patient Location: Shortstay  Anesthesia Type: MAC  Level of Consciousness: awake  Airway & Oxygen Therapy: Patient Spontanous Breathing   Post-op Assessment: Report given to PACU RN, Post -op Vital signs reviewed and stable and Patient moving all extremities  Post vital signs: Reviewed and stable  Complications: No apparent anesthesia complications

## 2016-01-20 NOTE — Op Note (Signed)
Date of Admission: 01/20/2016  Date of Surgery: 01/20/2016   Pre-Op Dx: Cataract Right Eye  Post-Op Dx: Senile Nuclear Cataract Right  Eye,  Dx Code H25.11  Surgeon: Gemma PayorKerry Pranish Akhavan, M.D.  Assistants: None  Anesthesia: Topical with MAC  Indications: Painless, progressive loss of vision with compromise of daily activities.  Surgery: Cataract Extraction with Intraocular lens Implant Right Eye  Discription: The patient had dilating drops and viscous lidocaine placed into the Right eye in the pre-op holding area. After transfer to the operating room, a time out was performed. The patient was then prepped and draped. Beginning with a 75 degree blade a paracentesis port was made at the surgeon's 2 o'clock position. The anterior chamber was then filled with 1% non-preserved lidocaine. This was followed by filling the anterior chamber with Provisc.  A 2.204mm keratome blade was used to make a clear corneal incision at the temporal limbus.  A bent cystatome needle was used to create a continuous tear capsulotomy. Hydrodissection was performed with balanced salt solution on a Fine canula. The lens nucleus was then removed using the phacoemulsification handpiece. Residual cortex was removed with the I&A handpiece. The anterior chamber and capsular bag were refilled with Provisc. A posterior chamber intraocular lens was placed into the capsular bag with it's injector. The implant was positioned with the Kuglan hook. The Provisc was then removed from the anterior chamber and capsular bag with the I&A handpiece. Stromal hydration of the main incision and paracentesis port was performed with BSS on a Fine canula. The wounds were tested for leak which was negative. The patient tolerated the procedure well. There were no operative complications. The patient was then transferred to the recovery room in stable condition.  Complications: None  Specimen: None  EBL: None  Prosthetic device: Hoya iSert 250, power 17.0 D,  SN [id number].

## 2016-01-20 NOTE — Anesthesia Postprocedure Evaluation (Signed)
  Anesthesia Post-op Note  Patient: Tanner CornDudley I Roots  Procedure(s) Performed: Procedure(s) (LRB): CATARACT EXTRACTION PHACO AND INTRAOCULAR LENS PLACEMENT; CDE:  12.54 (Right)  Patient Location:  Short Stay  Anesthesia Type: MAC  Level of Consciousness: awake  Airway and Oxygen Therapy: Patient Spontanous Breathing  Post-op Pain: none  Post-op Assessment: Post-op Vital signs reviewed, Patient's Cardiovascular Status Stable, Respiratory Function Stable, Patent Airway, No signs of Nausea or vomiting and Pain level controlled  Post-op Vital Signs: Reviewed and stable  Complications: No apparent anesthesia complications Anesthesia Post Note  Patient: [name]  Procedure(s) Performed: Procedure(s) (LRB): CATARACT EXTRACTION PHACO AND INTRAOCULAR LENS PLACEMENT; CDE:  12.54 (Right)  Anesthesia Post Evaluation  Last Vitals:  Vitals:   01/20/16 0913 01/20/16 0918  BP: 110/73 110/73  Pulse:    Resp: 11 12  Temp:      Last Pain:  Vitals:   01/20/16 0836  TempSrc: Oral                 Anya Murphey

## 2016-01-20 NOTE — Discharge Instructions (Signed)

## 2016-01-24 ENCOUNTER — Encounter (HOSPITAL_COMMUNITY): Payer: Self-pay | Admitting: Ophthalmology

## 2016-04-11 ENCOUNTER — Other Ambulatory Visit: Payer: Self-pay

## 2016-04-11 DIAGNOSIS — I7389 Other specified peripheral vascular diseases: Secondary | ICD-10-CM

## 2016-05-04 ENCOUNTER — Encounter: Payer: Self-pay | Admitting: Vascular Surgery

## 2016-05-09 ENCOUNTER — Ambulatory Visit (HOSPITAL_COMMUNITY)
Admission: RE | Admit: 2016-05-09 | Discharge: 2016-05-09 | Disposition: A | Discharge status: Home / Self Care | Payer: Medicare Other | Source: Ambulatory Visit | Attending: Vascular Surgery | Admitting: Vascular Surgery

## 2016-05-09 ENCOUNTER — Ambulatory Visit (INDEPENDENT_AMBULATORY_CARE_PROVIDER_SITE_OTHER): Payer: Medicare Other | Admitting: Vascular Surgery

## 2016-05-09 ENCOUNTER — Encounter: Payer: Self-pay | Admitting: Vascular Surgery

## 2016-05-09 VITALS — BP 124/86 | HR 66 | Temp 97.4°F | Resp 18 | Ht 73.0 in | Wt 256.9 lb

## 2016-05-09 DIAGNOSIS — I87303 Chronic venous hypertension (idiopathic) without complications of bilateral lower extremity: Secondary | ICD-10-CM | POA: Diagnosis not present

## 2016-05-09 DIAGNOSIS — I7389 Other specified peripheral vascular diseases: Secondary | ICD-10-CM | POA: Diagnosis not present

## 2016-05-09 NOTE — Progress Notes (Signed)
[facility] [place]  Patient name: Tanner CornDudley I Mosteller MRN: [id number] DOB: [birth year] Sex: male  REASON FOR CONSULT: Evaluate clearance for possible knee surgery.  HPI: [name] is a 76 y.o. male, who is seen today for clearance for possible knee surgery. He has a history of laser ablation 2010 of his great saphenous vein with Dr. Hart RochesterLawson. He has had the stable venous hypertension since that time and has actually had some regression of the changes of hemosiderin deposit around his right ankle. He is very compliant with his graduated compression garments. He has no prior history of arterial insufficiency. He reports that he has had some recent conservative treatment regarding his knees has improved and he may not require knee replacement.  Past Medical History:  Diagnosis Date  . Arthritis   . Hypertension   . Stroke [facility](HCC)    no deficits  . Tremors of nervous system   . Vertigo     History reviewed. No pertinent family history.  SOCIAL HISTORY: Social History   Social History  . Marital status: Married    Spouse name: N/A  . Number of children: N/A  . Years of education: N/A   Occupational History  . Not on file.   Social History Main Topics  . Smoking status: Never Smoker  . Smokeless tobacco: Never Used  . Alcohol use No  . Drug use: No  . Sexual activity: Yes    Birth control/ protection: None   Other Topics Concern  . Not on file   Social History Narrative  . No narrative on file    Allergies  Allergen Reactions  . Aspirin Hives  . Bee Venom Hives and Swelling  . 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 Lactose Intolerance (Gi) Diarrhea  . Penicillins Rash    Current Outpatient Prescriptions  Medication Sig Dispense Refill  . acetaminophen (TYLENOL) 500 MG tablet Take 500 mg by mouth every 4 (four) hours as needed (pain).    . Ascorbic Acid  (VITAMIN C WITH ROSE HIPS) 500 MG tablet Take 500 mg by mouth 2 (two) times daily.    . Calcium Carbonate-Vitamin D (CALCIUM 500 + D PO) Take 500 mg by mouth at bedtime.    . diphenhydrAMINE (BENADRYL) 25 mg capsule Take 25 mg by mouth daily as needed (allergic reaction).    Marland Kitchen. dutasteride (AVODART) 0.5 MG capsule Take 0.5 mg by mouth daily.      . fish oil-omega-3 fatty acids 1000 MG capsule Take 1 g by mouth every other day.    . fluticasone (FLONASE) 50 MCG/ACT nasal spray Place 1 spray into both nostrils daily as needed for allergies or rhinitis.     . hydrocortisone (ANUSOL-HC) 2.5 % rectal cream Place 1 application rectally daily as needed (rectal swelling).    Marland Kitchen. lidocaine-prilocaine (EMLA) cream Apply 1 application topically daily.     . Lutein 20 MG TABS Take 20 mg by mouth daily.    . Magnesium 250 MG TABS Take 250 mg by mouth daily.     . meclizine (ANTIVERT) 25 MG tablet Take 25 mg by mouth 3 (three) times daily as needed for dizziness.    . Multiple Vitamin (MULTIVITAMIN WITH MINERALS) TABS Take 1 tablet by mouth every other day.     Marland Kitchen. OVER THE COUNTER MEDICATION Apply 1 application topically daily as needed (muscle pain). Muscle pain cream    .  Potassium Gluconate 595 MG TBCR Take 595 mg by mouth 2 (two) times daily.     . propranolol (INDERAL) 10 MG tablet Take 10 mg by mouth 3 (three) times daily.     . Zinc 50 MG TABS Take 50 mg by mouth daily.     No current facility-administered medications for this visit.     REVIEW OF SYSTEMS:  [X]  denotes positive finding, [ ]  denotes negative finding Cardiac  Comments:  Chest pain or chest pressure:    Shortness of breath upon exertion:    Short of breath when lying flat:    Irregular heart rhythm:        Vascular    Pain in calf, thigh, or hip brought on by ambulation: x Orthopedic   Pain in feet at night that wakes you up from your sleep:     Blood clot in your veins:    Leg swelling:  x       Pulmonary    Oxygen at home:      Productive cough:     Wheezing:         Neurologic    Sudden weakness in arms or legs:     Sudden numbness in arms or legs:     Sudden onset of difficulty speaking or slurred speech:    Temporary loss of vision in one eye:     Problems with dizziness:         Gastrointestinal    Blood in stool:     Vomited blood:         Genitourinary    Burning when urinating:     Blood in urine:        Psychiatric    Major depression:         Hematologic    Bleeding problems:    Problems with blood clotting too easily:        Skin    Rashes or ulcers:        Constitutional    Fever or chills:      PHYSICAL EXAM: Vitals:   05/09/16 1502  BP: 124/86  Pulse: 66  Resp: 18  Temp: 97.4 F (36.3 C)  TempSrc: Oral  SpO2: 94%  Weight: 256 lb 14.4 oz (116.5 kg)  Height: 6\' 1"  (1.854 m)    GENERAL: The patient is a well-nourished male, in no acute distress. The vital signs are documented above. CARDIOVASCULAR: Carotid arteries without bruits bilaterally. 2+ radial pulses bilaterally. 2+ popliteal and 2+ posterior tibial pulses bilaterally PULMONARY: There is good air exchange  ABDOMEN: Soft and non-tender  MUSCULOSKELETAL: There are no major deformities or cyanosis. NEUROLOGIC: No focal weakness or paresthesias are detected. SKIN: There are no ulcers or rashes noted. Changes of chronic venous stasis with hemosiderin deposits bilaterally at the level of the ankle. No open ulcers PSYCHIATRIC: The patient has a normal affect.  DATA:  Noninvasive studies revealed normal ankle arm index and normal triphasic waveforms bilaterally  MEDICAL ISSUES: Discuss these studies at length with patient and his wife present. I do not see any vascular contraindication from either arterial or venous standpoint. He will continue to follow-up with [place] on an as-needed basis regarding his venous hypertension. He is extremely compliant with his compression garment is been instructed to continue  this.   Larina Earthly, MD FACS [facility] Tel [phone] Pager [phone]

## 2016-05-24 ENCOUNTER — Ambulatory Visit (INDEPENDENT_AMBULATORY_CARE_PROVIDER_SITE_OTHER): Payer: Medicare Other | Admitting: Family

## 2016-05-24 DIAGNOSIS — B351 Tinea unguium: Secondary | ICD-10-CM
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
## 2016-12-05 ENCOUNTER — Ambulatory Visit (INDEPENDENT_AMBULATORY_CARE_PROVIDER_SITE_OTHER): Payer: Medicare Other | Admitting: Orthopedic Surgery

## 2016-12-05 ENCOUNTER — Encounter (INDEPENDENT_AMBULATORY_CARE_PROVIDER_SITE_OTHER): Payer: Self-pay | Admitting: Orthopedic Surgery

## 2016-12-05 DIAGNOSIS — L97511 Non-pressure chronic ulcer of other part of right foot limited to breakdown of skin: Secondary | ICD-10-CM

## 2016-12-05 DIAGNOSIS — B351 Tinea unguium: Secondary | ICD-10-CM | POA: Diagnosis not present
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
## 2016-12-06 ENCOUNTER — Telehealth (INDEPENDENT_AMBULATORY_CARE_PROVIDER_SITE_OTHER): Payer: Self-pay | Admitting: Orthopedic Surgery

## 2016-12-06 NOTE — Telephone Encounter (Signed)
uc- have him go to [email or web address], type in heavy duty toe nail clippers, there are several there

## 2016-12-06 NOTE — Telephone Encounter (Signed)
Please advise 

## 2016-12-06 NOTE — Telephone Encounter (Signed)
Patient called asked if Dr Lajoyce Cornersuda can tell him where to go online to order the heavy duty toenail clippers. Patient said if he can not find them maybe Dr Lajoyce Cornersuda can have them ordered for him and he come to pay for them at [facility]. The number to contact patient is [phone]

## 2016-12-11 NOTE — Telephone Encounter (Signed)
I have tried to call patient, line is busy, will try later.

## 2016-12-13 NOTE — Telephone Encounter (Signed)
IC told wife she will look into

## 2018-01-21 ENCOUNTER — Other Ambulatory Visit: Payer: Self-pay | Admitting: Otolaryngology

## 2018-01-21 DIAGNOSIS — R221 Localized swelling, mass and lump, neck: Secondary | ICD-10-CM

## 2018-01-22 ENCOUNTER — Ambulatory Visit
Admission: RE | Admit: 2018-01-22 | Discharge: 2018-01-22 | Disposition: A | Discharge status: Home / Self Care | Payer: Medicare Other | Source: Ambulatory Visit | Attending: Otolaryngology | Admitting: Otolaryngology

## 2018-01-22 DIAGNOSIS — R221 Localized swelling, mass and lump, neck: Secondary | ICD-10-CM

## 2018-01-22 MED ORDER — IOPAMIDOL (ISOVUE-300) INJECTION 61%
75.0000 mL | Freq: Once | INTRAVENOUS | Status: AC | PRN
  Administered 2018-01-22: 75 mL via INTRAVENOUS

## 2018-01-29 ENCOUNTER — Other Ambulatory Visit: Payer: Self-pay | Admitting: Family Medicine

## 2018-01-29 DIAGNOSIS — E041 Nontoxic single thyroid nodule: Secondary | ICD-10-CM

## 2018-02-15 ENCOUNTER — Ambulatory Visit
Admission: RE | Admit: 2018-02-15 | Discharge: 2018-02-15 | Disposition: A | Discharge status: Home / Self Care | Payer: Medicare Other | Source: Ambulatory Visit | Attending: Family Medicine | Admitting: Family Medicine

## 2018-02-15 DIAGNOSIS — E041 Nontoxic single thyroid nodule: Secondary | ICD-10-CM

## 2018-02-21 ENCOUNTER — Other Ambulatory Visit: Payer: Self-pay | Admitting: Family Medicine

## 2018-02-21 DIAGNOSIS — E041 Nontoxic single thyroid nodule: Secondary | ICD-10-CM

## 2018-03-14 ENCOUNTER — Telehealth: Payer: Self-pay | Admitting: Physician Assistant

## 2018-03-14 ENCOUNTER — Ambulatory Visit
Admission: RE | Admit: 2018-03-14 | Discharge: 2018-03-14 | Disposition: A | Discharge status: Home / Self Care | Payer: Medicare Other | Source: Ambulatory Visit | Attending: Family Medicine | Admitting: Family Medicine

## 2018-03-14 ENCOUNTER — Other Ambulatory Visit (HOSPITAL_COMMUNITY): Payer: Self-pay | Admitting: Family Medicine

## 2018-03-14 ENCOUNTER — Other Ambulatory Visit: Payer: Self-pay | Admitting: Family Medicine

## 2018-03-14 DIAGNOSIS — E041 Nontoxic single thyroid nodule: Secondary | ICD-10-CM

## 2018-03-14 NOTE — Telephone Encounter (Signed)
  [facility] [name] for FNA thyroid biopsy.  He is very nervous and very uneasy about the procedure and the number of needles involved.  He states there is no way he can tolerate it without moderate sedation.  He is requesting to be rescheduled at the hospital so he can have IV sedation.  He understands he will need to be NPO x 6 hours prior to the procedure.  He also understands insurance may not cover the cost of sedation. He assures me his will.  Will contact schedul[facility] at [facility] arrange procedure.  Terrell Ostrand S Ilithyia Titzer PA-C 03/14/2018 4:04 PM

## 2018-03-15 ENCOUNTER — Encounter (HOSPITAL_COMMUNITY): Payer: Self-pay | Admitting: Neurology

## 2018-03-15 ENCOUNTER — Other Ambulatory Visit: Payer: Self-pay

## 2018-03-15 ENCOUNTER — Observation Stay (HOSPITAL_BASED_OUTPATIENT_CLINIC_OR_DEPARTMENT_OTHER): Payer: Medicare Other

## 2018-03-15 ENCOUNTER — Observation Stay (HOSPITAL_COMMUNITY)
Admission: EM | Admit: 2018-03-15 | Discharge: 2018-03-17 | Disposition: A | Discharge status: Home / Self Care | Payer: Medicare Other | Attending: Internal Medicine | Admitting: Internal Medicine

## 2018-03-15 ENCOUNTER — Emergency Department (HOSPITAL_COMMUNITY): Payer: Medicare Other

## 2018-03-15 DIAGNOSIS — T7840XS Allergy, unspecified, sequela: Secondary | ICD-10-CM

## 2018-03-15 DIAGNOSIS — R251 Tremor, unspecified: Secondary | ICD-10-CM | POA: Diagnosis not present

## 2018-03-15 DIAGNOSIS — R42 Dizziness and giddiness: Secondary | ICD-10-CM | POA: Insufficient documentation

## 2018-03-15 DIAGNOSIS — I10 Essential (primary) hypertension: Secondary | ICD-10-CM | POA: Diagnosis present

## 2018-03-15 DIAGNOSIS — G43109 Migraine with aura, not intractable, without status migrainosus: Secondary | ICD-10-CM

## 2018-03-15 DIAGNOSIS — G459 Transient cerebral ischemic attack, unspecified: Secondary | ICD-10-CM | POA: Diagnosis not present

## 2018-03-15 DIAGNOSIS — J811 Chronic pulmonary edema: Secondary | ICD-10-CM

## 2018-03-15 DIAGNOSIS — G250 Essential tremor: Secondary | ICD-10-CM | POA: Diagnosis not present

## 2018-03-15 DIAGNOSIS — I499 Cardiac arrhythmia, unspecified: Secondary | ICD-10-CM | POA: Diagnosis not present

## 2018-03-15 DIAGNOSIS — Z79899 Other long term (current) drug therapy: Secondary | ICD-10-CM | POA: Diagnosis not present

## 2018-03-15 DIAGNOSIS — R531 Weakness: Secondary | ICD-10-CM | POA: Diagnosis present

## 2018-03-15 DIAGNOSIS — E041 Nontoxic single thyroid nodule: Secondary | ICD-10-CM | POA: Diagnosis present

## 2018-03-15 DIAGNOSIS — E785 Hyperlipidemia, unspecified: Secondary | ICD-10-CM

## 2018-03-15 HISTORY — DX: Nontoxic single thyroid nodule: E04.1

## 2018-03-15 HISTORY — DX: Cardiac arrhythmia, unspecified: I49.9

## 2018-03-15 LAB — CBC
HCT: 47 % (ref 39.0–52.0)
HCT: 43.3 % (ref 39.0–52.0)
Hemoglobin: 14.8 g/dL (ref 13.0–17.0)
Hemoglobin: 14 g/dL (ref 13.0–17.0)
MCH: 31.3 pg (ref 26.0–34.0)
MCH: 31.6 pg (ref 26.0–34.0)
MCHC: 31.5 g/dL (ref 30.0–36.0)
MCHC: 32.3 g/dL (ref 30.0–36.0)
MCV: 99.4 fL (ref 80.0–100.0)
MCV: 97.7 fL (ref 80.0–100.0)
Platelets: 238 10*3/uL (ref 150–400)
Platelets: 245 10*3/uL (ref 150–400)
RBC: 4.73 MIL/uL (ref 4.22–5.81)
RBC: 4.43 MIL/uL (ref 4.22–5.81)
RDW: 13.2 % (ref 11.5–15.5)
RDW: 13.1 % (ref 11.5–15.5)
WBC: 8 10*3/uL (ref 4.0–10.5)
WBC: 8.6 10*3/uL (ref 4.0–10.5)
nRBC: 0 % (ref 0.0–0.2)
nRBC: 0 % (ref 0.0–0.2)

## 2018-03-15 LAB — COMPREHENSIVE METABOLIC PANEL
ALT: 18 U/L (ref 0–44)
AST: 22 U/L (ref 15–41)
Albumin: 3.7 g/dL (ref 3.5–5.0)
Alkaline Phosphatase: 47 U/L (ref 38–126)
Anion gap: 5 (ref 5–15)
BUN: 15 mg/dL (ref 8–23)
CO2: 26 mmol/L (ref 22–32)
Calcium: 9.4 mg/dL (ref 8.9–10.3)
Chloride: 112 mmol/L — ABNORMAL HIGH (ref 98–111)
Creatinine, Ser: 0.96 mg/dL (ref 0.61–1.24)
GFR calc Af Amer: >60 mL/min (ref >60)
GFR calc non Af Amer: >60 mL/min (ref >60)
Glucose, Bld: 100 mg/dL — ABNORMAL HIGH (ref 70–99)
Potassium: 3.8 mmol/L (ref 3.5–5.1)
Sodium: 143 mmol/L (ref 135–145)
Total Bilirubin: 0.6 mg/dL (ref 0.3–1.2)
Total Protein: 6.9 g/dL (ref 6.5–8.1)

## 2018-03-15 LAB — DIFFERENTIAL
Abs Immature Granulocytes: 0.03 10*3/uL (ref 0.00–0.07)
Basophils Absolute: 0 10*3/uL (ref 0.0–0.1)
Basophils Relative: 0 %
Eosinophils Absolute: 0.5 10*3/uL (ref 0.0–0.5)
Eosinophils Relative: 7 %
Immature Granulocytes: 0 %
Lymphocytes Relative: 36 %
Lymphs Abs: 2.9 10*3/uL (ref 0.7–4.0)
Monocytes Absolute: 0.6 10*3/uL (ref 0.1–1.0)
Monocytes Relative: 8 %
Neutro Abs: 4 10*3/uL (ref 1.7–7.7)
Neutrophils Relative %: 49 %

## 2018-03-15 LAB — PROTIME-INR
INR: 0.99
Prothrombin Time: 13 seconds (ref 11.4–15.2)

## 2018-03-15 LAB — I-STAT CHEM 8, ED
BUN: 18 mg/dL (ref 8–23)
Calcium, Ion: 1.19 mmol/L (ref 1.15–1.40)
Chloride: 109 mmol/L (ref 98–111)
Creatinine, Ser: 0.9 mg/dL (ref 0.61–1.24)
Glucose, Bld: 92 mg/dL (ref 70–99)
HCT: 46 % (ref 39.0–52.0)
Hemoglobin: 15.6 g/dL (ref 13.0–17.0)
Potassium: 3.8 mmol/L (ref 3.5–5.1)
Sodium: 144 mmol/L (ref 135–145)
TCO2: 26 mmol/L (ref 22–32)

## 2018-03-15 LAB — APTT: aPTT: 30 seconds (ref 24–36)

## 2018-03-15 LAB — CBG MONITORING, ED: Glucose-Capillary: 119 mg/dL — ABNORMAL HIGH (ref 70–99)

## 2018-03-15 LAB — CREATININE, SERUM
Creatinine, Ser: 0.94 mg/dL (ref 0.61–1.24)
GFR calc Af Amer: >60 mL/min (ref >60)
GFR calc non Af Amer: >60 mL/min (ref >60)

## 2018-03-15 LAB — I-STAT TROPONIN, ED: Troponin i, poc: 0.01 ng/mL (ref 0.00–0.08)

## 2018-03-15 MED ORDER — ADULT MULTIVITAMIN W/MINERALS CH: 1.0000 | ORAL_TABLET | ORAL | Status: DC

## 2018-03-15 MED ORDER — MAGNESIUM GLUCONATE 500 MG PO TABS
250.0000 mg | ORAL_TABLET | Freq: Every day | ORAL | Status: DC
  Administered 2018-03-16: 250 mg via ORAL
  Filled 2018-03-15: qty 1

## 2018-03-15 MED ORDER — MECLIZINE HCL 25 MG PO TABS
25.0000 mg | ORAL_TABLET | Freq: Three times a day (TID) | ORAL | Status: DC | PRN
  Filled 2018-03-15: qty 1

## 2018-03-15 MED ORDER — ACETAMINOPHEN 500 MG PO TABS
500.0000 mg | ORAL_TABLET | ORAL | Status: DC | PRN
  Administered 2018-03-16 – 2018-03-17 (×2): 500 mg via ORAL
  Filled 2018-03-15 (×2): qty 1

## 2018-03-15 MED ORDER — STROKE: EARLY STAGES OF RECOVERY BOOK
Freq: Once | Status: AC
  Administered 2018-03-15: 18:00:00
  Filled 2018-03-15 (×2): qty 1

## 2018-03-15 MED ORDER — SENNOSIDES-DOCUSATE SODIUM 8.6-50 MG PO TABS: 1.0000 | ORAL_TABLET | Freq: Every evening | ORAL | Status: DC | PRN

## 2018-03-15 MED ORDER — MAGNESIUM 250 MG PO TABS: 250.0000 mg | ORAL_TABLET | Freq: Every day | ORAL | Status: DC

## 2018-03-15 MED ORDER — ASPIRIN EC 81 MG PO TBEC
81.0000 mg | DELAYED_RELEASE_TABLET | Freq: Every day | ORAL | Status: DC
  Administered 2018-03-15 – 2018-03-17 (×3): 81 mg via ORAL
  Filled 2018-03-15 (×4): qty 1

## 2018-03-15 MED ORDER — LUTEIN 20 MG PO TABS: 20.0000 mg | ORAL_TABLET | Freq: Every day | ORAL | Status: DC

## 2018-03-15 MED ORDER — DUTASTERIDE 0.5 MG PO CAPS
0.5000 mg | ORAL_CAPSULE | Freq: Every day | ORAL | Status: DC
  Administered 2018-03-16 – 2018-03-17 (×2): 0.5 mg via ORAL
  Filled 2018-03-15 (×2): qty 1

## 2018-03-15 MED ORDER — ENOXAPARIN SODIUM 40 MG/0.4ML ~~LOC~~ SOLN
40.0000 mg | SUBCUTANEOUS | Status: DC
  Filled 2018-03-15: qty 0.4

## 2018-03-15 MED ORDER — OMEGA-3-ACID ETHYL ESTERS 1 G PO CAPS
1.0000 g | ORAL_CAPSULE | ORAL | Status: DC
  Administered 2018-03-16 – 2018-03-17 (×2): 1 g via ORAL
  Filled 2018-03-15 (×2): qty 1

## 2018-03-15 MED ORDER — CLOPIDOGREL BISULFATE 75 MG PO TABS: 75.0000 mg | ORAL_TABLET | Freq: Every day | ORAL | Status: DC

## 2018-03-15 MED ORDER — DIPHENHYDRAMINE HCL 50 MG/ML IJ SOLN: 12.5000 mg | Freq: Four times a day (QID) | INTRAMUSCULAR | Status: DC | PRN

## 2018-03-15 MED ORDER — ATORVASTATIN CALCIUM 40 MG PO TABS
40.0000 mg | ORAL_TABLET | Freq: Every day | ORAL | Status: DC
  Administered 2018-03-15: 40 mg via ORAL
  Filled 2018-03-15: qty 1
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
## 2018-03-15 NOTE — Progress Notes (Signed)
*  PRELIMINARY RESULTS* Vascular Ultrasound Carotid Duplex (Doppler) has been completed.   Findings suggest 1-39% internal carotid artery stenosis bilaterally. Vertebral arteries are patent with antegrade flow.  03/15/2018 5:47 PM Gertie FeyMichelle Khamarion Bjelland, MHA, RVT, RDCS, RDMS
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
## 2018-03-15 NOTE — Code Documentation (Signed)
77 yo male coming from home with complaints of new onset of left arm and leg weakness that started at 1100. Pt ate lunch and it did not get any better. Reports he was tying his shoe when he noticed the inability to use his left hand. Wife drove patient to the ED, and the RN activated a Code Stroke. Stroke Team met patient in CT. CT Head negative for hemorrhage. Initial NIHSS 2 due to left sided sensory decreased and slight left facial droop. Pt reports that the symptoms have gotten better. Remains in the window until 1530. Handoff given to [place], Therapist, sports.

## 2018-03-15 NOTE — Progress Notes (Signed)
Spoke with patient , who states he occasionally has pain in his chest. When I asked him what helped him at home, he said "calm and relax" . Deep breathing taught. Spoke to patient regarding essential oil. Not allergic to anything, patient is willing to try essential oil therapy. Peace blend, 2 drops placed on cotton ball and placed in med cup. Educated on oil therapy, with wife at bedside.

## 2018-03-15 NOTE — ED Triage Notes (Signed)
Pt screened by sort RN, presents with left arm and leg weakness that started about an hour prior, some strength has returned but pt still has decreased grip strength and leg weakness noted on his left side, pt states he still feels weak, no facial droop noted, pt states his brain felt "numb" when symptoms first started but that has improved. Code stroke activated on arrival and pt taken for MD evaluation.

## 2018-03-16 ENCOUNTER — Observation Stay (HOSPITAL_BASED_OUTPATIENT_CLINIC_OR_DEPARTMENT_OTHER): Payer: Medicare Other

## 2018-03-16 ENCOUNTER — Observation Stay (HOSPITAL_COMMUNITY): Payer: Medicare Other

## 2018-03-16 ENCOUNTER — Encounter (HOSPITAL_COMMUNITY): Payer: Self-pay | Admitting: Radiology

## 2018-03-16 DIAGNOSIS — E041 Nontoxic single thyroid nodule: Secondary | ICD-10-CM | POA: Diagnosis present

## 2018-03-16 DIAGNOSIS — G459 Transient cerebral ischemic attack, unspecified: Secondary | ICD-10-CM | POA: Diagnosis not present

## 2018-03-16 DIAGNOSIS — R251 Tremor, unspecified: Secondary | ICD-10-CM | POA: Diagnosis present

## 2018-03-16 DIAGNOSIS — E785 Hyperlipidemia, unspecified: Secondary | ICD-10-CM | POA: Diagnosis not present

## 2018-03-16 DIAGNOSIS — R42 Dizziness and giddiness: Secondary | ICD-10-CM

## 2018-03-16 DIAGNOSIS — I10 Essential (primary) hypertension: Secondary | ICD-10-CM | POA: Diagnosis present

## 2018-03-16 DIAGNOSIS — I499 Cardiac arrhythmia, unspecified: Secondary | ICD-10-CM | POA: Diagnosis present

## 2018-03-16 DIAGNOSIS — G43109 Migraine with aura, not intractable, without status migrainosus: Secondary | ICD-10-CM | POA: Diagnosis not present

## 2018-03-16 LAB — LIPID PANEL
Cholesterol: 144 mg/dL (ref 0–200)
HDL: 26 mg/dL — ABNORMAL LOW (ref >40)
LDL Cholesterol: 98 mg/dL (ref 0–99)
Total CHOL/HDL Ratio: 5.5 RATIO
Triglycerides: 102 mg/dL (ref <150)
VLDL: 20 mg/dL (ref 0–40)

## 2018-03-16 LAB — ECHOCARDIOGRAM COMPLETE: Weight: 4155.23 oz

## 2018-03-16 LAB — MAGNESIUM: Magnesium: 1.8 mg/dL (ref 1.7–2.4)

## 2018-03-16 LAB — HEMOGLOBIN A1C
Hgb A1c MFr Bld: 5.3 % (ref 4.8–5.6)
Mean Plasma Glucose: 105.41 mg/dL

## 2018-03-16 LAB — HEMOGLOBIN AND HEMATOCRIT, BLOOD
HCT: 44.9 % (ref 39.0–52.0)
Hemoglobin: 14.4 g/dL (ref 13.0–17.0)

## 2018-03-16 LAB — BRAIN NATRIURETIC PEPTIDE: B Natriuretic Peptide: 21.3 pg/mL (ref 0.0–100.0)

## 2018-03-16 LAB — TSH: TSH: 0.107 u[IU]/mL — ABNORMAL LOW (ref 0.350–4.500)

## 2018-03-16 MED ORDER — IOPAMIDOL (ISOVUE-370) INJECTION 76%
INTRAVENOUS | Status: AC
  Filled 2018-03-16: qty 50

## 2018-03-16 MED ORDER — MAGNESIUM GLUCONATE 500 MG PO TABS
500.0000 mg | ORAL_TABLET | Freq: Every day | ORAL | Status: DC
  Administered 2018-03-16 – 2018-03-17 (×2): 500 mg via ORAL
  Filled 2018-03-16 (×2): qty 1

## 2018-03-16 MED ORDER — CLOPIDOGREL BISULFATE 75 MG PO TABS
75.0000 mg | ORAL_TABLET | Freq: Every day | ORAL | Status: DC
  Administered 2018-03-16 – 2018-03-17 (×2): 75 mg via ORAL
  Filled 2018-03-16 (×2): qty 1

## 2018-03-16 MED ORDER — ATORVASTATIN CALCIUM 10 MG PO TABS
20.0000 mg | ORAL_TABLET | Freq: Every day | ORAL | Status: DC
  Administered 2018-03-16: 20 mg via ORAL
  Filled 2018-03-16: qty 2

## 2018-03-16 MED ORDER — IOPAMIDOL (ISOVUE-370) INJECTION 76%
50.0000 mL | Freq: Once | INTRAVENOUS | Status: AC | PRN
  Administered 2018-03-16: 50 mL via INTRAVENOUS

## 2018-03-16 MED ORDER — PROPRANOLOL HCL 10 MG PO TABS
10.0000 mg | ORAL_TABLET | Freq: Three times a day (TID) | ORAL | Status: DC
  Administered 2018-03-16 – 2018-03-17 (×3): 10 mg via ORAL
  Filled 2018-03-16 (×5): qty 1

## 2018-03-16 NOTE — Care Management Note (Signed)
Case Management Note  Patient Details  Name: [name] I Dubinsky MRN: [id number] Date of Birth: 08-25-1940  Subjective/Objective:                    Action/Plan:  Spoke w patient and wife at bedside. [facility] have [facility] them before in the past. [place]. No other CM needs.  Expected Discharge Date:  03/16/18               Expected Discharge Plan:  Home w Home Health Services  In-House Referral:     Discharge planning Services  CM Consult  Post Acute Care Choice:  Home Health Choice offered to:  Patient, Spouse  DME Arranged:    DME Agency:     HH Arranged:  PT HH Agency:  [facility]  Status of Service:  Completed, signed off  If discussed at Long Length of Stay Meetings, dates discussed:    Additional Comments:  Lawerance SabalDebbie Shomari Scicchitano, RN 03/16/2018, 4:41 PM

## 2018-03-16 NOTE — Care Management Obs Status (Signed)
MEDICARE OBSERVATION STATUS NOTIFICATION   Patient Details  Name: [name] MRN: [id number] Date of Birth: 11-Feb-1941   Medicare Observation Status Notification Given:  Yes    Lawerance Sabalebbie Cadi Rhinehart, RN 03/16/2018, 4:32 PM

## 2018-03-16 NOTE — Progress Notes (Signed)
*  PRELIMINARY RESULTS* Echocardiogram 2D Echocardiogram has been performed.  Stacey DrainWhite, [place] Prentiss J 03/16/2018, 3:43 PM
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
## 2018-03-16 NOTE — Progress Notes (Addendum)
[facility] PROGRESS NOTE  [name] ZOX:[id number] DOB: Sep 30, 1940 DOA: 03/15/2018 PCP: Koren Shiver, DO  Assessment/Plan:  1.  Transient left-sided weakness:. CTA neck reveals No hemodynamically significant stenosis ICA's. Patent vertebral arteries. Suspected cardiomegaly and pulmonary edema, incompletely evaluated.  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 An incidental finding of potential clinical significance  2 cm RIGHT thyroid nodule. Echo with EF 60% mild LVH and grade 1 diastolic dysfunction. Lipid panel HDL 26 otherwise within limits of normal, A1c 5.3. PT recommending HHPT. Neurology recommended aspirin 325 mg however patient would like to start of trying baby aspirin after carefully considering the risks and benefits and if tolerates, dosage can be advanced. Neuro recommending aspirin 81mg  and plavix 75mg  for 3 weeks then asa 81.  Also start on  statins.  He cannot undergo MRI/MRA.  awaiting speech eval and   2.  Hypertension/essential tremors: Low end of normal.  Patient on propranolol at baseline. Continue to hold for permissive hypertension  3.  Polypharmacy: Patient advised to discuss with PCP regarding multiple over-the-counter medications that he has been taking.  He was also advised of procoagulant effects of vitamin K and multivitamins.  Will resume fish oil and magnesium supplements for now and hold others.  4.  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  Patient has not been taking aspirin or Plavix or statins for the last few years.  5.  History of multiple allergies: Will have Benadryl available as needed.  Timeline and correlation of some of these allergy symptoms unclear.  Benefit from outpatient allergy specialist evaluation for further clarification.  6. ?cardiomegaly/pulmonary edema. Per CTA. Echo as noted above -chest xray  7. Arrythmia.  12 beat run vtach. Asymptomatic. Mag level 1.8 -will resume home BB - Obtain tsh -change home magnesium supplement to 500mg  daily for 3 days.   Code Status: full Family Communication: wife at bedside Disposition Plan: home in am   Consultants:  xu neuro  Procedures:  echo  Antibiotics:    HPI/Subjective: Admitted with stroke like symptoms that resolved fairly quickly. Hx stroke. tia work up  Objective: Vitals:   03/16/18 1105 03/16/18 1538  BP: 116/87 124/76  Pulse: 68 81  Resp: 18 18  Temp: 98.3 F (36.8 C) 97.8 F (36.6 C)  SpO2: 94% 97%    Intake/Output Summary (Last 24 hours) at 03/16/2018 1601 Last data filed at 03/15/2018 1800 Gross per 24 hour  Intake 100 ml  Output -  Net 100 ml   Filed Weights   03/15/18 1443  Weight: 117.8 kg    Exam:   General:  Awake alert in no acute distress  Cardiovascular: rrr no MGR no LE edema  Respiratory: normal effort BS slightly distant but clear no wheeze  Abdomen: obese soft +BS no guarding or rebounding  Musculoskeletal: joints without swelling/erythema   Data Reviewed: Basic Metabolic Panel: Recent Labs  Lab 03/15/18 1410 03/15/18 1417 03/15/18 1746 03/16/18 0348  NA 143 144  --   --   K 3.8 3.8  --   --   CL 112* 109  --   --   CO2 26  --   --   --   GLUCOSE 100* 92  --   --   BUN 15 18  --   --   CREATININE 0.96 0.90 0.94  --   CALCIUM 9.4  --   --   --  MG  --   --   --  1.8   Liver Function Tests: Recent Labs  Lab 03/15/18 1410  AST 22  ALT 18  ALKPHOS 47  BILITOT 0.6  PROT 6.9  ALBUMIN 3.7   No results for input(s): LIPASE, AMYLASE in the last 168 hours. No results for input(s): AMMONIA in the last 168 hours. CBC: Recent Labs  Lab 03/15/18 1410 03/15/18 1417 03/15/18 1746 03/16/18 0936  WBC 8.0  --  8.6  --   NEUTROABS 4.0  --   --   --   HGB 14.8 15.6 14.0 14.4  HCT 47.0 46.0 43.3 44.9  MCV 99.4  --  97.7  --   PLT 238  --  245  --    Cardiac Enzymes: No  results for input(s): CKTOTAL, CKMB, CKMBINDEX, TROPONINI in the last 168 hours. BNP (last 3 results) No results for input(s): BNP in the last 8760 hours.  ProBNP (last 3 results) No results for input(s): PROBNP in the last 8760 hours.  CBG: Recent Labs  Lab 03/15/18 1408  GLUCAP 119*    No results found for this or any previous visit (from the past 240 hour(s)).   Studies: Ct Angio Head W Or Wo Contrast  Result Date: 03/16/2018 CLINICAL DATA:  Follow up stroke, LEFT extremity weakness. History of hypertension. EXAM: CT ANGIOGRAPHY HEAD AND NECK TECHNIQUE: Multidetector CT imaging of the head and neck was performed using the standard protocol during bolus administration of intravenous contrast. Multiplanar CT image reconstructions and MIPs were obtained to evaluate the vascular anatomy. Carotid stenosis measurements (when applicable) are obtained utilizing NASCET criteria, using the distal internal carotid diameter as the denominator. CONTRAST:  50mL ISOVUE-370 IOPAMIDOL (ISOVUE-370) INJECTION 76% COMPARISON:  CT HEAD March 15, 2018 FINDINGS: CTA NECK FINDINGS: AORTIC ARCH: Normal appearance of the thoracic arch, normal branch pattern. Mild calcific atherosclerosis aortic arch. The origins of the innominate, left Common carotid artery and subclavian artery are widely patent. RIGHT CAROTID SYSTEM: Common carotid artery is patent. Trace calcific atherosclerosis of the carotid bifurcation without hemodynamically significant stenosis by NASCET criteria. Normal appearance of the internal carotid artery. LEFT CAROTID SYSTEM: Common carotid artery is patent. Normal appearance of the carotid bifurcation without hemodynamically significant stenosis by NASCET criteria. Normal appearance of the internal carotid artery. VERTEBRAL ARTERIES:RIGHT vertebral artery is dominant. Patent vertebral arteries with mild extrinsic compression due to degenerative cervical spine. SKELETON: No acute osseous process  though bone windows have not been submitted. 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 Severe C3-4, C4-5, C5-6 neural foraminal narrowing. Moderate canal stenosis C4-5, C5-6. OTHER NECK: Soft tissues of the neck are nonacute though, not tailored for evaluation. 2 cm irregular RIGHT thyroid nodule. UPPER CHEST: Suspected cardiomegaly with bronchial wall thickening pulmonary vascular congestion. CTA HEAD FINDINGS: ANTERIOR CIRCULATION: Patent cervical internal carotid arteries, petrous, cavernous and supra clinoid internal carotid arteries. Patent anterior communicating artery, lobulated appearance without discrete aneurysm. Patent anterior and middle cerebral arteries. Moderate stenosis RIGHT M3 origin. No large vessel occlusion, significant stenosis, contrast extravasation or aneurysm. POSTERIOR CIRCULATION: Patent vertebral arteries, vertebrobasilar junction and basilar artery, as well as main branch vessels. Mild luminal irregularity LEFT V4 segment most compatible with atherosclerosis. Patent posterior cerebral arteries, mild to moderate luminal irregularity compatible with atherosclerosis. No large vessel occlusion, significant stenosis, contrast extravasation or aneurysm. VENOUS SINUSES: Major dural venous sinuses are patent though not tailored for evaluation on this angiographic examination. ANATOMIC VARIANTS: None. DELAYED PHASE: No abnormal intracranial  enhancement. MIP images reviewed. IMPRESSION: CTA NECK: 1. No hemodynamically significant stenosis ICA's. Patent vertebral arteries. 2. Suspected cardiomegaly and pulmonary edema, incompletely evaluated. Recommend chest radiograph. 3. 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 4. **An incidental finding of potential clinical significance has been found. 2 cm RIGHT thyroid nodule. Recommend NONEMERGENT thyroid ultrasound. This follows ACR consensus guidelines: Managing Incidental Thyroid Nodules  Detected on Imaging: White Paper of the ACR Incidental Thyroid Findings Committee. J Am Coll Radiol 2015; 12:143-150.** CTA HEAD: 1. No emergent large vessel occlusion. Moderate stenosis RIGHT M3 origin. Aortic Atherosclerosis (ICD10-I70.0). Electronically Signed   By: Awilda Metro M.D.   On: 03/16/2018 13:34   Ct Angio Neck W Or Wo Contrast  Result Date: 03/16/2018 CLINICAL DATA:  Follow up stroke, LEFT extremity weakness. History of hypertension. EXAM: CT ANGIOGRAPHY HEAD AND NECK TECHNIQUE: Multidetector CT imaging of the head and neck was performed using the standard protocol during bolus administration of intravenous contrast. Multiplanar CT image reconstructions and MIPs were obtained to evaluate the vascular anatomy. Carotid stenosis measurements (when applicable) are obtained utilizing NASCET criteria, using the distal internal carotid diameter as the denominator. CONTRAST:  50mL ISOVUE-370 IOPAMIDOL (ISOVUE-370) INJECTION 76% COMPARISON:  CT HEAD March 15, 2018 FINDINGS: CTA NECK FINDINGS: AORTIC ARCH: Normal appearance of the thoracic arch, normal branch pattern. Mild calcific atherosclerosis aortic arch. The origins of the innominate, left Common carotid artery and subclavian artery are widely patent. RIGHT CAROTID SYSTEM: Common carotid artery is patent. Trace calcific atherosclerosis of the carotid bifurcation without hemodynamically significant stenosis by NASCET criteria. Normal appearance of the internal carotid artery. LEFT CAROTID SYSTEM: Common carotid artery is patent. Normal appearance of the carotid bifurcation without hemodynamically significant stenosis by NASCET criteria. Normal appearance of the internal carotid artery. VERTEBRAL ARTERIES:RIGHT vertebral artery is dominant. Patent vertebral arteries with mild extrinsic compression due to degenerative cervical spine. SKELETON: No acute osseous process though bone windows have not been submitted. Severe C5-6 and moderate to  severe C4-5 spondylosis. Severe C3-4, C4-5, C5-6 neural foraminal narrowing. Moderate canal stenosis C4-5, C5-6. OTHER NECK: Soft tissues of the neck are nonacute though, not tailored for evaluation. 2 cm irregular RIGHT thyroid nodule. UPPER CHEST: Suspected cardiomegaly with bronchial wall thickening pulmonary vascular congestion. CTA HEAD FINDINGS: ANTERIOR CIRCULATION: Patent cervical internal carotid arteries, petrous, cavernous and supra clinoid internal carotid arteries. Patent anterior communicating artery, lobulated appearance without discrete aneurysm. Patent anterior and middle cerebral arteries. Moderate stenosis RIGHT M3 origin. No large vessel occlusion, significant stenosis, contrast extravasation or aneurysm. POSTERIOR CIRCULATION: Patent vertebral arteries, vertebrobasilar junction and basilar artery, as well as main branch vessels. Mild luminal irregularity LEFT V4 segment most compatible with atherosclerosis. Patent posterior cerebral arteries, mild to moderate luminal irregularity compatible with atherosclerosis. No large vessel occlusion, significant stenosis, contrast extravasation or aneurysm. VENOUS SINUSES: Major dural venous sinuses are patent though not tailored for evaluation on this angiographic examination. ANATOMIC VARIANTS: None. DELAYED PHASE: No abnormal intracranial enhancement. MIP images reviewed. IMPRESSION: CTA NECK: 1. No hemodynamically significant stenosis ICA's. Patent vertebral arteries. 2. Suspected cardiomegaly and pulmonary edema, incompletely evaluated. Recommend chest radiograph. 3. 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 4. **An incidental finding of potential clinical significance has been found. 2 cm RIGHT thyroid nodule. Recommend NONEMERGENT thyroid ultrasound. This follows ACR consensus guidelines: Managing Incidental Thyroid Nodules Detected on Imaging: White Paper of the ACR Incidental Thyroid Findings  Committee. J Am  Coll Radiol 2015; 12:143-150.** CTA HEAD: 1. No emergent large vessel occlusion. Moderate stenosis RIGHT M3 origin. Aortic Atherosclerosis (ICD10-I70.0). Electronically Signed   By: Awilda Metroourtnay  Bloomer M.D.   On: 03/16/2018 13:34   Ct Head Code Stroke Wo Contrast  Result Date: 03/15/2018 CLINICAL DATA:  Code stroke.  Left arm and leg weakness. EXAM: CT HEAD WITHOUT CONTRAST TECHNIQUE: Contiguous axial images were obtained from the base of the skull through the vertex without intravenous contrast. COMPARISON:  10/28/2015 FINDINGS: Brain: There is no evidence of acute infarct, intracranial hemorrhage, midline shift, or extra-axial fluid collection. A chronic left occipital infarct is again noted with ex vacuo dilatation of the left occipital horn. A 3.7 x 1.8 cm extra-axial CSF collection along the right aspect of the falx in the high posterior frontal region is unchanged and may represent an arachnoid cyst or localized hygroma with buckling of the underlying superior frontal gyrus. There is mild cerebral atrophy. Patchy bilateral cerebral white matter hypodensities are unchanged and nonspecific but compatible with moderate chronic small vessel ischemic disease. Vascular: Mild calcified atherosclerosis at the skull base. No hyperdense vessel. Skull: No fracture or focal osseous lesion. Sinuses/Orbits: Mild mucosal thickening in the ethmoid air cells bilaterally. Clear mastoid air cells. Bilateral cataract extraction. Other: None. ASPECTS [facility]([place] Stroke Program Early CT Score) - Ganglionic level infarction (caudate, lentiform nuclei, internal capsule, insula, M1-M3 cortex): 7 - Supraganglionic infarction (M4-M6 cortex): 3 Total score (0-10 with 10 being normal): 10 IMPRESSION: 1. No evidence of acute intracranial abnormality. 2. ASPECTS is 10. 3. 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 These results were communicated to Dr. Roda ShuttersXu at 2:30 pm on 03/15/2018 by  text page via the [facility] messaging system. Electronically Signed   By: Sebastian AcheAllen  Grady M.D.   On: 03/15/2018 14:36    Scheduled Meds: . aspirin EC  81 mg Oral Daily  . atorvastatin  20 mg Oral q1800  . clopidogrel  75 mg Oral Daily  . dutasteride  0.5 mg Oral Daily  . enoxaparin (LOVENOX) injection  40 mg Subcutaneous Q24H  . iopamidol      . magnesium gluconate  250 mg Oral Daily  . omega-3 acid ethyl esters  1 g Oral QODAY  . propranolol  10 mg Oral TID   Continuous Infusions:  Principal Problem:   TIA (transient ischemic attack) Active Problems:   Hypertension   Vertigo   Arrhythmia   Tremors of nervous system   Thyroid nodule    Time spent: 45 minutes    [facility] LLCBLACK,Kimberley Speece M NP [facility]  If 7PM-7AM, please contact night-coverage at [email or web address], password [email or web address] 03/16/2018, 4:01 PM  LOS: 0 days

## 2018-03-17 DIAGNOSIS — G43109 Migraine with aura, not intractable, without status migrainosus: Secondary | ICD-10-CM | POA: Diagnosis not present

## 2018-03-17 DIAGNOSIS — I10 Essential (primary) hypertension: Secondary | ICD-10-CM

## 2018-03-17 LAB — T4, FREE: Free T4: 1.05 ng/dL (ref 0.82–1.77)

## 2018-03-17 MED ORDER — ASPIRIN 81 MG PO TBEC: 81.0000 mg | DELAYED_RELEASE_TABLET | Freq: Every day | ORAL | Status: DC

## 2018-03-17 MED ORDER — CLOPIDOGREL BISULFATE 75 MG PO TABS: 75.0000 mg | ORAL_TABLET | Freq: Every day | ORAL | 1 refills | Status: DC

## 2018-03-17 MED ORDER — CLOPIDOGREL BISULFATE 75 MG PO TABS: 75.0000 mg | ORAL_TABLET | Freq: Every day | ORAL | 0 refills | Status: AC

## 2018-03-17 MED ORDER — ATORVASTATIN CALCIUM 20 MG PO TABS: 20.0000 mg | ORAL_TABLET | Freq: Every day | ORAL | 1 refills | Status: DC

## 2018-03-17 MED ORDER — SENNOSIDES-DOCUSATE SODIUM 8.6-50 MG PO TABS: 1.0000 | ORAL_TABLET | Freq: Every evening | ORAL | Status: DC | PRN

## 2018-03-17 NOTE — Discharge Summary (Signed)
Physician Discharge Summary  [name] ZOX:[id number] DOB: 08/29/1940 DOA: 03/15/2018  PCP: Koren Shiver, DO  Admit date: 03/15/2018 Discharge date: 03/17/2018  Time spent: 40 minutes  Recommendations for Outpatient Follow-up:  1. Follow up with Stroke team as scheduled 2. Follow up clinic regarding schedule biopsy of thyroid as may need to reschedule given will be taking Plavix for 3 weeks   Discharge Diagnoses:  Principal Problem:   TIA (transient ischemic attack) Active Problems:   Hypertension   Vertigo   Arrhythmia   Tremors of nervous system   Thyroid nodule   Discharge Condition: stable  Diet recommendation: heart healthy  Filed Weights   03/15/18 1443  Weight: 117.8 kg    History of present illness:  Patient presented 03/15/18 with acute onset of left-sided weakness that he noticed when he tried to put on his shoes.  He stated he could not use his left arm and felt "limp".  He also felt his left leg was weaker than usual.  The symptoms were associated with numbness and tingling along the left side of his face.  No facial droop or dysphagia or dysarthria reported by patient or wife who was at bedside.  Patient evaluated in the ED today and seen by neurology.  Given rapid improvement in his symptoms, he was felt not to be a candidate for TPA.  Neurology recommended admission for further stroke work-up and aspirin therapy.  Hospital Course:   1.Transient left-sided weakness: neuro opines complicated migraine vs tia vs small subcortical infarct. At baseline on discharge. CTA neck reveals No hemodynamically significant stenosis ICA's. Patent vertebral arteries. Suspected cardiomegaly and pulmonary edema, incompletely evaluated.  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 An incidental finding of potential clinical significance  2 cm RIGHT thyroid nodule. Echo with EF 60% mild LVH and grade 1 diastolic  dysfunction. Lipid panel HDL 26 otherwise within limits of normal, A1c 5.3. PT recommending HHPT.Neurology recommended aspirin 325 mg however patient would like to start of trying baby aspirin after carefully considering the risks and benefitsand if tolerates,dosagecan be advanced. Neuro recommending aspirin 81mg  and plavix 75mg  for 3 weeks then asa 81. Also start on  statins. He cannot undergo MRI/MRA. awaiting speech eval and   2.Hypertension/essential tremors: resume home BB  3.Polypharmacy: Patient advised to discuss with PCP regarding multiple over-the-counter medications that he has been taking. He was also advised of procoagulant effects of vitamin K and multivitamins.   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 Patient has not been taking aspirin or Plavix or statins for the last few years.  5.History of multiple allergies:stable  6. ?cardiomegaly/pulmonary edema. Per CTA. Echo as noted above. Chest xray with no cardiopulmonary process  Procedures:  echo  Consultations:  Dr Roda Shutters neuro  Discharge Exam: Vitals:   03/17/18 0400 03/17/18 0816  BP: 105/60 121/84  Pulse: 73 72  Resp: 18 18  Temp: 99 F (37.2 C) 97.6 F (36.4 C)  SpO2: 97% 95%    General: awake alert sitting up eating breakfast no acute distress Cardiovascular: RRR no mgr no LE edema Respiratory: normal effort BS clear bilaterally no wheeze  Discharge Instructions   Discharge Instructions    Ambulatory referral to Neurology   Complete by:  As directed    Follow up with stroke clinic NP (Jessica Vanschaick or Darrol Angel, if both not available, consider Manson Allan, or Ahern) at [facility]  in about 4 weeks. Thanks.   Call MD for:  difficulty breathing, headache or visual disturbances   Complete by:  As directed    Call MD for:  persistant dizziness or light-headedness   Complete by:  As directed    Diet - low sodium heart  healthy   Complete by:  As directed    Discharge instructions   Complete by:  As directed    Recommend rescheduling thyroid biopsy while on plavix and asa. Call clinic to update them to recent hospitalization and new medications.   Increase activity slowly   Complete by:  As directed      Allergies as of 03/17/2018      Reactions   Aspirin    Had hives due to bee sting while on asprin   Bee Venom Hives, Swelling   Horse-derived Products Other (See Comments)   Patient can't take because of allergy to bees   Lactose Intolerance (gi) Diarrhea   Penicillins Rash      Medication List    TAKE these medications   acetaminophen 500 MG tablet Commonly known as:  TYLENOL Take 500 mg by mouth every 4 (four) hours as needed (pain).   aspirin 81 MG EC tablet Take 1 tablet (81 mg total) by mouth daily.   atorvastatin 20 MG tablet Commonly known as:  LIPITOR Take 1 tablet (20 mg total) by mouth daily at 6 PM.   CALCIUM 500 + D PO Take 500 mg by mouth at bedtime.   clopidogrel 75 MG tablet Commonly known as:  PLAVIX Take 1 tablet (75 mg total) by mouth daily for 21 days.   diphenhydrAMINE 25 mg capsule Commonly known as:  BENADRYL Take 25 mg by mouth daily as needed (allergic reaction).   dutasteride 0.5 MG capsule Commonly known as:  AVODART Take 0.5 mg by mouth daily.   fish oil-omega-3 fatty acids 1000 MG capsule Take 1 g by mouth every other day.   fluticasone 50 MCG/ACT nasal spray Commonly known as:  FLONASE Place 1 spray into both nostrils daily as needed for allergies or rhinitis.   hydrocortisone 2.5 % rectal cream Commonly known as:  ANUSOL-HC Place 1 application rectally daily as needed (rectal swelling).   lidocaine-prilocaine cream Commonly known as:  EMLA Apply 1 application topically daily.   Lutein 20 MG Tabs Take 20 mg by mouth daily.   Magnesium 250 MG Tabs Take 250 mg by mouth daily.   meclizine 25 MG tablet Commonly known as:  ANTIVERT Take  25 mg by mouth 3 (three) times daily as needed for dizziness.   multivitamin with minerals Tabs tablet Take 1 tablet by mouth every other day.   OVER THE COUNTER MEDICATION Apply 1 application topically daily as needed (muscle pain). Muscle pain cream   Potassium Gluconate 595 MG Tbcr Take 595 mg by mouth 2 (two) times daily.   propranolol 10 MG tablet Commonly known as:  INDERAL Take 10 mg by mouth 3 (three) times daily.   senna-docusate 8.6-50 MG tablet Commonly known as:  Senokot-S Take 1 tablet by mouth at bedtime as needed for moderate constipation.   vitamin C with rose hips 500 MG tablet Take 500 mg by mouth 2 (two) times daily.   Zinc 50 MG Tabs Take 50 mg by mouth daily.      Allergies  Allergen Reactions  . Aspirin     Had hives due to bee sting while on asprin  . Bee Venom Hives and Swelling  .  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 Lactose Intolerance (Gi) Diarrhea  . Penicillins Rash   Follow-up Information    [facility]. Schedule an appointment as soon as possible for a visit in 4 week(s).   Specialty:  Neurology Contact information: [address] [place] [address] [phone]       [facility]-Home Follow up.   Specialty:  Home Health Services Why:  For home health PT. They will call you in the next 1-2 days to set up your first home visit.  Contact information: [address] [place] [address] [phone]            The results of significant diagnostics from this hospitalization (including imaging, microbiology, ancillary and laboratory) are listed below for reference.    Significant Diagnostic Studies: Ct Angio Head W Or Wo Contrast  Result Date: 03/16/2018 CLINICAL DATA:  Follow up stroke, LEFT extremity weakness. History of hypertension. EXAM: CT ANGIOGRAPHY HEAD AND NECK TECHNIQUE: Multidetector CT  imaging of the head and neck was performed using the standard protocol during bolus administration of intravenous contrast. Multiplanar CT image reconstructions and MIPs were obtained to evaluate the vascular anatomy. Carotid stenosis measurements (when applicable) are obtained utilizing NASCET criteria, using the distal internal carotid diameter as the denominator. CONTRAST:  50mL ISOVUE-370 IOPAMIDOL (ISOVUE-370) INJECTION 76% COMPARISON:  CT HEAD March 15, 2018 FINDINGS: CTA NECK FINDINGS: AORTIC ARCH: Normal appearance of the thoracic arch, normal branch pattern. Mild calcific atherosclerosis aortic arch. The origins of the innominate, left Common carotid artery and subclavian artery are widely patent. RIGHT CAROTID SYSTEM: Common carotid artery is patent. Trace calcific atherosclerosis of the carotid bifurcation without hemodynamically significant stenosis by NASCET criteria. Normal appearance of the internal carotid artery. LEFT CAROTID SYSTEM: Common carotid artery is patent. Normal appearance of the carotid bifurcation without hemodynamically significant stenosis by NASCET criteria. Normal appearance of the internal carotid artery. VERTEBRAL ARTERIES:RIGHT vertebral artery is dominant. Patent vertebral arteries with mild extrinsic compression due to degenerative cervical spine. SKELETON: No acute osseous process though bone windows have not been submitted. 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 Severe C3-4, C4-5, C5-6 neural foraminal narrowing. Moderate canal stenosis C4-5, C5-6. OTHER NECK: Soft tissues of the neck are nonacute though, not tailored for evaluation. 2 cm irregular RIGHT thyroid nodule. UPPER CHEST: Suspected cardiomegaly with bronchial wall thickening pulmonary vascular congestion. CTA HEAD FINDINGS: ANTERIOR CIRCULATION: Patent cervical internal carotid arteries, petrous, cavernous and supra clinoid internal carotid arteries. Patent anterior communicating artery, lobulated  appearance without discrete aneurysm. Patent anterior and middle cerebral arteries. Moderate stenosis RIGHT M3 origin. No large vessel occlusion, significant stenosis, contrast extravasation or aneurysm. POSTERIOR CIRCULATION: Patent vertebral arteries, vertebrobasilar junction and basilar artery, as well as main branch vessels. Mild luminal irregularity LEFT V4 segment most compatible with atherosclerosis. Patent posterior cerebral arteries, mild to moderate luminal irregularity compatible with atherosclerosis. No large vessel occlusion, significant stenosis, contrast extravasation or aneurysm. VENOUS SINUSES: Major dural venous sinuses are patent though not tailored for evaluation on this angiographic examination. ANATOMIC VARIANTS: None. DELAYED PHASE: No abnormal intracranial enhancement. MIP images reviewed. IMPRESSION: CTA NECK: 1. No hemodynamically significant stenosis ICA's. Patent vertebral arteries. 2. Suspected cardiomegaly and pulmonary edema, incompletely evaluated. Recommend chest radiograph. 3. 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 4. **An incidental finding of potential clinical significance has been found.  2 cm RIGHT thyroid nodule. Recommend NONEMERGENT thyroid ultrasound. This follows ACR consensus guidelines: Managing Incidental Thyroid Nodules Detected on Imaging: White Paper of the ACR Incidental Thyroid Findings Committee. J Am Coll Radiol 2015; 12:143-150.** CTA HEAD: 1. No emergent large vessel occlusion. Moderate stenosis RIGHT M3 origin. Aortic Atherosclerosis (ICD10-I70.0). Electronically Signed   By: Awilda Metro M.D.   On: 03/16/2018 13:34   Ct Angio Neck W Or Wo Contrast  Result Date: 03/16/2018 CLINICAL DATA:  Follow up stroke, LEFT extremity weakness. History of hypertension. EXAM: CT ANGIOGRAPHY HEAD AND NECK TECHNIQUE: Multidetector CT imaging of the head and neck was performed using the standard protocol during  bolus administration of intravenous contrast. Multiplanar CT image reconstructions and MIPs were obtained to evaluate the vascular anatomy. Carotid stenosis measurements (when applicable) are obtained utilizing NASCET criteria, using the distal internal carotid diameter as the denominator. CONTRAST:  50mL ISOVUE-370 IOPAMIDOL (ISOVUE-370) INJECTION 76% COMPARISON:  CT HEAD March 15, 2018 FINDINGS: CTA NECK FINDINGS: AORTIC ARCH: Normal appearance of the thoracic arch, normal branch pattern. Mild calcific atherosclerosis aortic arch. The origins of the innominate, left Common carotid artery and subclavian artery are widely patent. RIGHT CAROTID SYSTEM: Common carotid artery is patent. Trace calcific atherosclerosis of the carotid bifurcation without hemodynamically significant stenosis by NASCET criteria. Normal appearance of the internal carotid artery. LEFT CAROTID SYSTEM: Common carotid artery is patent. Normal appearance of the carotid bifurcation without hemodynamically significant stenosis by NASCET criteria. Normal appearance of the internal carotid artery. VERTEBRAL ARTERIES:RIGHT vertebral artery is dominant. Patent vertebral arteries with mild extrinsic compression due to degenerative cervical spine. SKELETON: No acute osseous process though bone windows have not been submitted. 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 Severe C3-4, C4-5, C5-6 neural foraminal narrowing. Moderate canal stenosis C4-5, C5-6. OTHER NECK: Soft tissues of the neck are nonacute though, not tailored for evaluation. 2 cm irregular RIGHT thyroid nodule. UPPER CHEST: Suspected cardiomegaly with bronchial wall thickening pulmonary vascular congestion. CTA HEAD FINDINGS: ANTERIOR CIRCULATION: Patent cervical internal carotid arteries, petrous, cavernous and supra clinoid internal carotid arteries. Patent anterior communicating artery, lobulated appearance without discrete aneurysm. Patent anterior and middle cerebral  arteries. Moderate stenosis RIGHT M3 origin. No large vessel occlusion, significant stenosis, contrast extravasation or aneurysm. POSTERIOR CIRCULATION: Patent vertebral arteries, vertebrobasilar junction and basilar artery, as well as main branch vessels. Mild luminal irregularity LEFT V4 segment most compatible with atherosclerosis. Patent posterior cerebral arteries, mild to moderate luminal irregularity compatible with atherosclerosis. No large vessel occlusion, significant stenosis, contrast extravasation or aneurysm. VENOUS SINUSES: Major dural venous sinuses are patent though not tailored for evaluation on this angiographic examination. ANATOMIC VARIANTS: None. DELAYED PHASE: No abnormal intracranial enhancement. MIP images reviewed. IMPRESSION: CTA NECK: 1. No hemodynamically significant stenosis ICA's. Patent vertebral arteries. 2. Suspected cardiomegaly and pulmonary edema, incompletely evaluated. Recommend chest radiograph. 3. 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 4. **An incidental finding of potential clinical significance has been found. 2 cm RIGHT thyroid nodule. Recommend NONEMERGENT thyroid ultrasound. This follows ACR consensus guidelines: Managing Incidental Thyroid Nodules Detected on Imaging: White Paper of the ACR Incidental Thyroid Findings Committee. J Am Coll Radiol 2015; 12:143-150.** CTA HEAD: 1. No emergent large vessel occlusion. Moderate stenosis RIGHT M3 origin. Aortic Atherosclerosis (ICD10-I70.0). Electronically Signed   By: Awilda Metro M.D.   On: 03/16/2018 13:34   Dg Chest Port 1 View  Result Date: 03/16/2018 CLINICAL DATA:  Pulmonary edema and chest tightness. EXAM:  PORTABLE CHEST 1 VIEW COMPARISON:  07/02/2017 FINDINGS: Size within normal limits. The lungs appear clear. No pleural effusion. Lag screw along the right glenoid with mild spurring of the right humeral head. IMPRESSION: No active cardiopulmonary  disease is radiographically apparent. Electronically Signed   By: Gaylyn RongWalter  Liebkemann M.D.   On: 03/16/2018 20:15   [place] Thyroid  Result Date: 02/15/2018 CLINICAL DATA:  Incidental on CT. 77 year old male with thyroid nodule seen on CT EXAM: THYROID ULTRASOUND TECHNIQUE: Ultrasound examination of the thyroid gland and adjacent soft tissues was performed. COMPARISON:  CT scan of the neck 01/22/2018 FINDINGS: Parenchymal Echotexture: Mildly heterogenous Isthmus: 0.7 cm Right lobe: 4.9 x 2.6 x 3.4 cm Left lobe: 4.5 x 2.1 x 1.8 cm _________________________________________________________ Estimated total number of nodules >/= 1 cm: 5 Number of spongiform nodules >/=  2 cm not described below (TR1): 0 Number of mixed cystic and solid nodules >/= 1.5 cm not described below (TR2): 0 _________________________________________________________ Nodule # 1: Sonographically benign spongiform nodule measuring up to 1.7 cm in the right aspect of the isthmus. No further follow-up required. ___________________________________________________________ Nodule # 2: Location: Right; Mid Maximum size: 3.4 cm; Other 2 dimensions: 2.9 x 2.8 cm Composition: solid/almost completely solid (2) Echogenicity: isoechoic (1) Shape: not taller-than-wide (0) Margins: ill-defined (0) Echogenic foci: none (0) ACR TI-RADS total points: 3. ACR TI-RADS risk category: TR3 (3 points). ACR TI-RADS recommendations: **Given size (>/= 2.5 cm) and appearance, fine needle aspiration of this mildly suspicious nodule should be considered based on TI-RADS criteria. _________________________________________________________ Nodule # 3: Location: Right; Inferior Maximum size: 2.2 cm; Other 2 dimensions: 2.1 x 2.1 cm Composition: solid/almost completely solid (2) Echogenicity: isoechoic (1) Shape: not taller-than-wide (0) Margins: ill-defined (0) Echogenic foci: none (0) ACR TI-RADS total points: 3. ACR TI-RADS risk category: TR3 (3 points). ACR TI-RADS recommendations:  *Given size (>/= 1.5 - 2.4 cm) and appearance, a follow-up ultrasound in 1 year should be considered based on TI-RADS criteria. _________________________________________________________ Nodule # 4: Location: Left; Inferior Maximum size: 1.5 cm; Other 2 dimensions: 1.3 x 1.2 cm Composition: solid/almost completely solid (2) Echogenicity: isoechoic (1) Shape: not taller-than-wide (0) Margins: ill-defined (0) Echogenic foci: none (0) ACR TI-RADS total points: 3. ACR TI-RADS risk category: TR3 (3 points). ACR TI-RADS recommendations: *Given size (>/= 1.5 - 2.4 cm) and appearance, a follow-up ultrasound in 1 year should be considered based on TI-RADS criteria. _________________________________________________________ Nodule # 5: Location: Left; Mid Maximum size: 1.0 cm; Other 2 dimensions: 0.9 x 0.8 cm Composition: solid/almost completely solid (2) Echogenicity: hypoechoic (2) Shape: not taller-than-wide (0) Margins: smooth (0) Echogenic foci: none (0) ACR TI-RADS total points: 4. ACR TI-RADS risk category: TR4 (4-6 points). ACR TI-RADS recommendations: *Given size (>/= 1 - 1.4 cm) and appearance, a follow-up ultrasound in 1 year should be considered based on TI-RADS criteria. ________________________________________________________ IMPRESSION: 1. 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 2. A 3.4 cm TI-RADS category 3 nodule (labeled # 2) in the right mid gland meets criteria for consideration of fine-needle aspiration biopsy. 3. A 1.0 cm TI-RADS category 4 nodule (labeled # 5) in the left mid gland meets criteria for follow-up ultrasound in 1 year. 4. A 2.2 cm TI-RADS category 3 nodule (labeled # 3) in the right inferior gland meets criteria for follow-up ultrasound in 1 year. 5. A 1.5 cm TI-RADS category 3 nodule (labeled # 4) in the left superior gland meets criteria for follow-up ultrasound in 1 year. The above is in keeping with  the ACR TI-RADS recommendations - J  Am Coll Radiol 2017;14:587-595. Electronically Signed   By: Malachy Moan M.D.   On: 02/15/2018 15:00   Ct Head Code Stroke Wo Contrast  Result Date: 03/15/2018 CLINICAL DATA:  Code stroke.  Left arm and leg weakness. EXAM: CT HEAD WITHOUT CONTRAST TECHNIQUE: Contiguous axial images were obtained from the base of the skull through the vertex without intravenous contrast. COMPARISON:  10/28/2015 FINDINGS: Brain: There is no evidence of acute infarct, intracranial hemorrhage, midline shift, or extra-axial fluid collection. A chronic left occipital infarct is again noted with ex vacuo dilatation of the left occipital horn. A 3.7 x 1.8 cm extra-axial CSF collection along the right aspect of the falx in the high posterior frontal region is unchanged and may represent an arachnoid cyst or localized hygroma with buckling of the underlying superior frontal gyrus. There is mild cerebral atrophy. Patchy bilateral cerebral white matter hypodensities are unchanged and nonspecific but compatible with moderate chronic small vessel ischemic disease. Vascular: Mild calcified atherosclerosis at the skull base. No hyperdense vessel. Skull: No fracture or focal osseous lesion. Sinuses/Orbits: Mild mucosal thickening in the ethmoid air cells bilaterally. Clear mastoid air cells. Bilateral cataract extraction. Other: None. ASPECTS [facility] Stroke Program Early CT Score) - Ganglionic level infarction (caudate, lentiform nuclei, internal capsule, insula, M1-M3 cortex): 7 - Supraganglionic infarction (M4-M6 cortex): 3 Total score (0-10 with 10 being normal): 10 IMPRESSION: 1. No evidence of acute intracranial abnormality. 2. ASPECTS is 10. 3. 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 These results were communicated to Dr. Roda Shutters at 2:30 pm on 03/15/2018 by text page via the [facility] messaging system. Electronically Signed   By: Sebastian Ache M.D.   On: 03/15/2018 14:36    Microbiology: No  results found for this or any previous visit (from the past 240 hour(s)).   Labs: Basic Metabolic Panel: Recent Labs  Lab 03/15/18 1410 03/15/18 1417 03/15/18 1746 03/16/18 0348  NA 143 144  --   --   K 3.8 3.8  --   --   CL 112* 109  --   --   CO2 26  --   --   --   GLUCOSE 100* 92  --   --   BUN 15 18  --   --   CREATININE 0.96 0.90 0.94  --   CALCIUM 9.4  --   --   --   MG  --   --   --  1.8   Liver Function Tests: Recent Labs  Lab 03/15/18 1410  AST 22  ALT 18  ALKPHOS 47  BILITOT 0.6  PROT 6.9  ALBUMIN 3.7   No results for input(s): LIPASE, AMYLASE in the last 168 hours. No results for input(s): AMMONIA in the last 168 hours. CBC: Recent Labs  Lab 03/15/18 1410 03/15/18 1417 03/15/18 1746 03/16/18 0936  WBC 8.0  --  8.6  --   NEUTROABS 4.0  --   --   --   HGB 14.8 15.6 14.0 14.4  HCT 47.0 46.0 43.3 44.9  MCV 99.4  --  97.7  --   PLT 238  --  245  --    Cardiac Enzymes: No results for input(s): CKTOTAL, CKMB, CKMBINDEX, TROPONINI in the last 168 hours. BNP: BNP (last 3 results) Recent Labs    03/16/18 0936  BNP 21.3    ProBNP (last 3 results) No results for input(s): PROBNP in the last 8760 hours.  CBG: Recent Labs  Lab 03/15/18 1408  GLUCAP 119*       Signed:  Gwenyth Bender NP.  [facility] 03/17/2018, 9:42 AM

## 2018-03-17 NOTE — Progress Notes (Signed)
Patient is discharging home with wife. Discharge paperwork went over with patient and wife. All questions and concerns addressed. Sent home with all personal belongings. Taken down in wheelchair. IV taken out.

## 2018-03-22 ENCOUNTER — Ambulatory Visit (HOSPITAL_COMMUNITY): Admission: RE | Admit: 2018-03-22 | Payer: Medicare Other | Source: Ambulatory Visit

## 2018-03-26 ENCOUNTER — Telehealth: Payer: Self-pay | Admitting: Adult Health

## 2018-03-26 NOTE — Telephone Encounter (Signed)
FYI Pt has just been scheduled, he has called back to inform RN that on the 15th of this month he is going off of clopidogrel (PLAVIX) 75 MG tablet because on 12-23 he will be in the hospital for a biopsy of his thyroid.  No call back requested.

## 2018-04-12 ENCOUNTER — Telehealth: Payer: Self-pay | Admitting: Neurology

## 2018-04-12 ENCOUNTER — Encounter: Payer: Self-pay | Admitting: Adult Health

## 2018-04-12 ENCOUNTER — Ambulatory Visit: Payer: Medicare Other | Admitting: Adult Health

## 2018-04-12 VITALS — BP 100/72 | HR 80 | Ht 75.0 in | Wt 259.5 lb

## 2018-04-12 DIAGNOSIS — G459 Transient cerebral ischemic attack, unspecified: Secondary | ICD-10-CM

## 2018-04-12 DIAGNOSIS — E785 Hyperlipidemia, unspecified: Secondary | ICD-10-CM | POA: Diagnosis not present

## 2018-04-12 DIAGNOSIS — I10 Essential (primary) hypertension: Secondary | ICD-10-CM | POA: Diagnosis not present

## 2018-04-12 DIAGNOSIS — G44211 Episodic tension-type headache, intractable: Secondary | ICD-10-CM

## 2018-04-12 NOTE — Patient Instructions (Signed)
Continue aspirin 81 mg daily  and lipitor  for secondary stroke prevention  You will not have to restart plavix after your procedure. You will continue on aspirin 81mg  alone  Please call PCP in regards to procedure that will take place on Monday for further instructions regarding aspirin 81mg  and not eating/drinking for how long prior to procedure  Continue to follow up with PCP regarding cholesterol and blood pressure management   Continue to monitor blood pressure at home  Maintain strict control of hypertension with blood pressure goal below 130/90, diabetes with hemoglobin A1c goal below 6.5% and cholesterol with LDL cholesterol (bad cholesterol) goal below 70 mg/dL. I also advised the patient to eat a healthy diet with plenty of whole grains, cereals, fruits and vegetables, exercise regularly and maintain ideal body weight.  Followup in the future with me in 6 months or call earlier if needed       [facility]. I hope we have been able to provide you high quality care today.  You may receive a patient satisfaction survey over the next few weeks. We would appreciate your feedback and comments so that we may continue to improve ourselves and the health of our patients.
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
## 2018-04-12 NOTE — Telephone Encounter (Signed)
Patient called on call physician concerning his antiplatelet treatment, he was taking both asa and plavix following TIA like episode on March 17, 2018,  Not a candidate for MRI,  CT angiogram showed no significant large vessel disease,  He is having thyroid biopsy potential thyroid surgery pending on April 15, 2018,  He has stopped Plavix for 6 days, wondering what to do with his aspirin daily, I failed to reach patient, left a message for him to stop aspirin 81 mg until after procedure, may restart aspirin 81 mg daily after his surgeon is okay with that

## 2018-04-15 ENCOUNTER — Encounter (HOSPITAL_COMMUNITY): Payer: Self-pay

## 2018-04-15 ENCOUNTER — Ambulatory Visit (HOSPITAL_COMMUNITY)
Admission: RE | Admit: 2018-04-15 | Discharge: 2018-04-15 | Disposition: A | Discharge status: Home / Self Care | Payer: Medicare Other | Source: Ambulatory Visit | Attending: Family Medicine | Admitting: Family Medicine

## 2018-04-15 ENCOUNTER — Other Ambulatory Visit: Payer: Self-pay

## 2018-04-15 DIAGNOSIS — E041 Nontoxic single thyroid nodule: Secondary | ICD-10-CM | POA: Diagnosis not present

## 2018-04-15 DIAGNOSIS — Z886 Allergy status to analgesic agent status: Secondary | ICD-10-CM | POA: Diagnosis not present

## 2018-04-15 DIAGNOSIS — Z888 Allergy status to other drugs, medicaments and biological substances status: Secondary | ICD-10-CM | POA: Insufficient documentation

## 2018-04-15 DIAGNOSIS — Z880 Allergy status to penicillin: Secondary | ICD-10-CM | POA: Insufficient documentation

## 2018-04-15 DIAGNOSIS — Z79899 Other long term (current) drug therapy: Secondary | ICD-10-CM | POA: Insufficient documentation

## 2018-04-15 DIAGNOSIS — Z7982 Long term (current) use of aspirin: Secondary | ICD-10-CM | POA: Insufficient documentation

## 2018-04-15 LAB — BASIC METABOLIC PANEL
Anion gap: 7 (ref 5–15)
BUN: 16 mg/dL (ref 8–23)
CO2: 24 mmol/L (ref 22–32)
Calcium: 9 mg/dL (ref 8.9–10.3)
Chloride: 109 mmol/L (ref 98–111)
Creatinine, Ser: 0.9 mg/dL (ref 0.61–1.24)
GFR calc Af Amer: >60 mL/min (ref >60)
GFR calc non Af Amer: >60 mL/min (ref >60)
Glucose, Bld: 102 mg/dL — ABNORMAL HIGH (ref 70–99)
Potassium: 3.8 mmol/L (ref 3.5–5.1)
Sodium: 140 mmol/L (ref 135–145)

## 2018-04-15 MED ORDER — SODIUM CHLORIDE 0.9 % IV SOLN
INTRAVENOUS | Status: AC | PRN
  Administered 2018-04-15: 10 mL/h via INTRAVENOUS

## 2018-04-15 MED ORDER — MIDAZOLAM HCL 2 MG/2ML IJ SOLN
INTRAMUSCULAR | Status: AC | PRN
  Administered 2018-04-15: 1 mg via INTRAVENOUS
  Administered 2018-04-15: 0.5 mg via INTRAVENOUS

## 2018-04-15 MED ORDER — LIDOCAINE HCL (PF) 1 % IJ SOLN
INTRAMUSCULAR | Status: AC
  Filled 2018-04-15: qty 30

## 2018-04-15 MED ORDER — FENTANYL CITRATE (PF) 100 MCG/2ML IJ SOLN
INTRAMUSCULAR | Status: AC | PRN
  Administered 2018-04-15 (×3): 25 ug via INTRAVENOUS

## 2018-04-15 MED ORDER — FENTANYL CITRATE (PF) 100 MCG/2ML IJ SOLN
INTRAMUSCULAR | Status: AC
  Filled 2018-04-15: qty 2

## 2018-04-15 MED ORDER — MIDAZOLAM HCL 2 MG/2ML IJ SOLN
INTRAMUSCULAR | Status: AC
  Filled 2018-04-15: qty 2
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
## 2018-04-15 NOTE — Procedures (Signed)
Right thyroid nodule FNA 25 g times four EBL 0 Comp 0

## 2018-04-15 NOTE — Discharge Instructions (Addendum)
Needle Biopsy, Care After °These instructions tell you how to care for yourself after your procedure. Your doctor may also give you more specific instructions. Call your doctor if you have any problems or questions. °What can I expect after the procedure? °After the procedure, it is common to have: °· Soreness. °· Bruising. °· Mild pain. °Follow these instructions at home: ° °· Return to your normal activities as told by your doctor. Ask your doctor what activities are safe for you. °· Take over-the-counter and prescription medicines only as told by your doctor. °· Wash your hands with soap and water before you change your bandage (dressing). If you cannot use soap and water, use hand sanitizer. °· Follow instructions from your doctor about: °? How to take care of your puncture site. °? When and how to change your bandage. °? When to remove your bandage. °· Check your puncture site every day for signs of infection. Watch for: °? Redness, swelling, or pain. °? Fluid or blood.  °? Pus or a bad smell. °? Warmth. °· Do not take baths, swim, or use a hot tub until your doctor approves. Ask your doctor if you may take showers. You may only be allowed to take sponge baths. °· Keep all follow-up visits as told by your doctor. This is important. °Contact a doctor if you have: °· A fever. °· Redness, swelling, or pain at the puncture site, and it lasts longer than a few days. °· Fluid, blood, or pus coming from the puncture site. °· Warmth coming from the puncture site. °Get help right away if: °· You have a lot of bleeding from the puncture site. °Summary °· After the procedure, it is common to have soreness, bruising, or mild pain at the puncture site. °· Check your puncture site every day for signs of infection, such as redness, swelling, or pain. °· Get help right away if you have severe bleeding from your puncture site. °This information is not intended to replace advice given to you by your health care provider. Make  sure you discuss any questions you have with your health care provider. °Document Released: 03/23/2008 Document Revised: 04/23/2017 Document Reviewed: 04/23/2017 °Elsevier Interactive Patient Education © 2019 [employer] ° °

## 2018-04-15 NOTE — Telephone Encounter (Signed)
error 

## 2018-04-28 NOTE — Progress Notes (Signed)
I agree with the above plan 

## 2018-09-24 ENCOUNTER — Emergency Department (HOSPITAL_COMMUNITY)
Admission: EM | Admit: 2018-09-24 | Discharge: 2018-09-25 | Disposition: A | Discharge status: Home / Self Care | Payer: Medicare Other | Attending: Emergency Medicine | Admitting: Emergency Medicine

## 2018-09-24 ENCOUNTER — Emergency Department (HOSPITAL_COMMUNITY): Payer: Medicare Other

## 2018-09-24 ENCOUNTER — Encounter (HOSPITAL_COMMUNITY): Payer: Self-pay

## 2018-09-24 ENCOUNTER — Other Ambulatory Visit: Payer: Self-pay

## 2018-09-24 DIAGNOSIS — R1012 Left upper quadrant pain: Secondary | ICD-10-CM | POA: Diagnosis not present

## 2018-09-24 DIAGNOSIS — Y999 Unspecified external cause status: Secondary | ICD-10-CM | POA: Insufficient documentation

## 2018-09-24 DIAGNOSIS — S20212A Contusion of left front wall of thorax, initial encounter: Secondary | ICD-10-CM

## 2018-09-24 DIAGNOSIS — I10 Essential (primary) hypertension: Secondary | ICD-10-CM | POA: Insufficient documentation

## 2018-09-24 DIAGNOSIS — E041 Nontoxic single thyroid nodule: Secondary | ICD-10-CM | POA: Diagnosis not present

## 2018-09-24 DIAGNOSIS — W010XXA Fall on same level from slipping, tripping and stumbling without subsequent striking against object, initial encounter: Secondary | ICD-10-CM | POA: Diagnosis not present

## 2018-09-24 DIAGNOSIS — Z79899 Other long term (current) drug therapy: Secondary | ICD-10-CM | POA: Insufficient documentation

## 2018-09-24 DIAGNOSIS — Y939 Activity, unspecified: Secondary | ICD-10-CM | POA: Insufficient documentation

## 2018-09-24 DIAGNOSIS — Y92009 Unspecified place in unspecified non-institutional (private) residence as the place of occurrence of the external cause: Secondary | ICD-10-CM | POA: Insufficient documentation

## 2018-09-24 DIAGNOSIS — Z7982 Long term (current) use of aspirin: Secondary | ICD-10-CM | POA: Insufficient documentation

## 2018-09-24 DIAGNOSIS — I700 Atherosclerosis of aorta: Secondary | ICD-10-CM | POA: Diagnosis not present

## 2018-09-24 DIAGNOSIS — S299XXA Unspecified injury of thorax, initial encounter: Secondary | ICD-10-CM | POA: Diagnosis present

## 2018-09-24 DIAGNOSIS — Z8673 Personal history of transient ischemic attack (TIA), and cerebral infarction without residual deficits: Secondary | ICD-10-CM | POA: Insufficient documentation

## 2018-09-24 DIAGNOSIS — W19XXXA Unspecified fall, initial encounter: Secondary | ICD-10-CM

## 2018-09-24 LAB — BASIC METABOLIC PANEL
Anion gap: 10 (ref 5–15)
BUN: 22 mg/dL (ref 8–23)
CO2: 23 mmol/L (ref 22–32)
Calcium: 9.3 mg/dL (ref 8.9–10.3)
Chloride: 107 mmol/L (ref 98–111)
Creatinine, Ser: 0.99 mg/dL (ref 0.61–1.24)
GFR calc Af Amer: >60 mL/min (ref >60)
GFR calc non Af Amer: >60 mL/min (ref >60)
Glucose, Bld: 119 mg/dL — ABNORMAL HIGH (ref 70–99)
Potassium: 4.2 mmol/L (ref 3.5–5.1)
Sodium: 140 mmol/L (ref 135–145)

## 2018-09-24 LAB — CK: Total CK: 195 U/L (ref 49–397)

## 2018-09-24 LAB — CBC WITH DIFFERENTIAL/PLATELET
Abs Immature Granulocytes: 0.03 10*3/uL (ref 0.00–0.07)
Basophils Absolute: 0.1 10*3/uL (ref 0.0–0.1)
Basophils Relative: 1 %
Eosinophils Absolute: 0.5 10*3/uL (ref 0.0–0.5)
Eosinophils Relative: 5 %
HCT: 47.8 % (ref 39.0–52.0)
Hemoglobin: 15.3 g/dL (ref 13.0–17.0)
Immature Granulocytes: 0 %
Lymphocytes Relative: 18 %
Lymphs Abs: 2 10*3/uL (ref 0.7–4.0)
MCH: 31.9 pg (ref 26.0–34.0)
MCHC: 32 g/dL (ref 30.0–36.0)
MCV: 99.6 fL (ref 80.0–100.0)
Monocytes Absolute: 1 10*3/uL (ref 0.1–1.0)
Monocytes Relative: 9 %
Neutro Abs: 7.4 10*3/uL (ref 1.7–7.7)
Neutrophils Relative %: 67 %
Platelets: 250 10*3/uL (ref 150–400)
RBC: 4.8 MIL/uL (ref 4.22–5.81)
RDW: 13.4 % (ref 11.5–15.5)
WBC: 11 10*3/uL — ABNORMAL HIGH (ref 4.0–10.5)
nRBC: 0 % (ref 0.0–0.2)

## 2018-09-24 MED ORDER — HYDROCODONE-ACETAMINOPHEN 5-325 MG PO TABS
2.0000 | ORAL_TABLET | Freq: Once | ORAL | Status: AC
  Administered 2018-09-24: 2 via ORAL
  Filled 2018-09-24: qty 2

## 2018-09-24 MED ORDER — MORPHINE SULFATE (PF) 4 MG/ML IV SOLN
4.0000 mg | Freq: Once | INTRAVENOUS | Status: AC
  Administered 2018-09-24: 4 mg via INTRAVENOUS
  Filled 2018-09-24: qty 1

## 2018-09-24 MED ORDER — IOHEXOL 300 MG/ML  SOLN
100.0000 mL | Freq: Once | INTRAMUSCULAR | Status: AC | PRN
  Administered 2018-09-24: 100 mL via INTRAVENOUS

## 2018-09-24 MED ORDER — MORPHINE SULFATE (PF) 2 MG/ML IV SOLN
2.0000 mg | Freq: Once | INTRAVENOUS | Status: AC
  Administered 2018-09-24: 2 mg via INTRAVENOUS
  Filled 2018-09-24: qty 1

## 2018-09-24 MED ORDER — SODIUM CHLORIDE 0.9 % IV BOLUS
500.0000 mL | Freq: Once | INTRAVENOUS | Status: AC
  Administered 2018-09-24: 500 mL via INTRAVENOUS

## 2018-09-24 NOTE — ED Notes (Signed)
[name] [phone]

## 2018-09-24 NOTE — Discharge Instructions (Addendum)
You have been diagnosed today with fall, contusion of the left ribs, thyroid nodule and aortic atherosclerosis.  At this time there does not appear to be the presence of an emergent medical condition, however there is always the potential for conditions to change. Please read and follow the below instructions.  Please return to the Emergency Department immediately for any new or worsening symptoms. Please be sure to follow up with your Primary Care Provider within one week regarding your visit today; please call their office to schedule an appointment even if you are feeling better for a follow-up visit. Please drink plenty water and get plenty of rest over the next few days to help with your symptoms.  You may use over the counter Tylenol as directed on the packaging. Your CT scan today showed a thyroid nodule, please discuss this with your primary care provider at your next visit as further evaluation will be needed.  Get help right away if you: Have difficulty breathing or shortness of breath. Develop a continual cough or you cough up thick or bloody sputum. Feel nauseous or you vomit. Have pain in your abdomen. Get help right away if: You have any of these: Shortness of breath. Pain in your abdomen. Nausea or vomiting. A fever or chills. A rapid heart rate. You cough up blood. You feel dizzy or weak. You faint. You have severe chest pain. This may come with other symptoms, such as: Dizziness. Shortness of breath. Pain in your neck, jaw, or back, or in one arm or both arms.  Please read the additional information packets attached to your discharge summary.  Do not take your medicine if  develop an itchy rash, swelling in your mouth or lips, or difficulty breathing.  Call 911/seek immediate emergency medical attention if this occurs.
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
## 2018-09-24 NOTE — ED Triage Notes (Signed)
Pt presents to ED following a fall. Pt complaining of left side rib pain. Pt denies LOC or hitting head. Pt takes ASA 81mg  daily.

## 2018-09-24 NOTE — ED Notes (Signed)
Spouse updated on plan of care.

## 2018-09-24 NOTE — ED Notes (Signed)
Pt refused to get up and walk due to pain EDPA notified and orders received.

## 2018-09-24 NOTE — ED Notes (Signed)
ED Provider at bedside. 

## 2018-09-25 ENCOUNTER — Other Ambulatory Visit: Payer: Self-pay

## 2018-09-25 ENCOUNTER — Encounter (HOSPITAL_COMMUNITY): Payer: Self-pay

## 2018-09-25 ENCOUNTER — Emergency Department (HOSPITAL_COMMUNITY)
Admission: EM | Admit: 2018-09-25 | Discharge: 2018-09-26 | Disposition: A | Discharge status: Home / Self Care | Payer: Medicare Other | Source: Home / Self Care | Attending: Emergency Medicine | Admitting: Emergency Medicine

## 2018-09-25 DIAGNOSIS — M79605 Pain in left leg: Secondary | ICD-10-CM | POA: Insufficient documentation

## 2018-09-25 DIAGNOSIS — Z79899 Other long term (current) drug therapy: Secondary | ICD-10-CM | POA: Insufficient documentation

## 2018-09-25 DIAGNOSIS — Z7982 Long term (current) use of aspirin: Secondary | ICD-10-CM | POA: Insufficient documentation

## 2018-09-25 DIAGNOSIS — W19XXXD Unspecified fall, subsequent encounter: Secondary | ICD-10-CM

## 2018-09-25 DIAGNOSIS — M7918 Myalgia, other site: Secondary | ICD-10-CM

## 2018-09-25 DIAGNOSIS — R109 Unspecified abdominal pain: Secondary | ICD-10-CM | POA: Insufficient documentation

## 2018-09-25 DIAGNOSIS — M549 Dorsalgia, unspecified: Secondary | ICD-10-CM | POA: Insufficient documentation

## 2018-09-25 DIAGNOSIS — I10 Essential (primary) hypertension: Secondary | ICD-10-CM | POA: Insufficient documentation

## 2018-09-25 DIAGNOSIS — Z8673 Personal history of transient ischemic attack (TIA), and cerebral infarction without residual deficits: Secondary | ICD-10-CM | POA: Insufficient documentation

## 2018-09-25 MED ORDER — METHOCARBAMOL 1000 MG/10ML IJ SOLN
500.0000 mg | Freq: Once | INTRAMUSCULAR | Status: AC
  Administered 2018-09-25: 500 mg via INTRAMUSCULAR
  Filled 2018-09-25: qty 5
  Filled 2018-09-25: qty 10

## 2018-09-25 MED ORDER — MORPHINE SULFATE 15 MG PO TABS
15.0000 mg | ORAL_TABLET | Freq: Once | ORAL | Status: AC
  Administered 2018-09-25: 15 mg via ORAL
  Filled 2018-09-25: qty 1

## 2018-09-25 MED ORDER — METHOCARBAMOL 1000 MG/10ML IJ SOLN: 1000.0000 mg | Freq: Once | INTRAMUSCULAR | Status: DC

## 2018-09-25 MED ORDER — KETOROLAC TROMETHAMINE 60 MG/2ML IM SOLN
15.0000 mg | Freq: Once | INTRAMUSCULAR | Status: AC
  Administered 2018-09-25: 15 mg via INTRAMUSCULAR
  Filled 2018-09-25: qty 2

## 2018-09-25 NOTE — ED Triage Notes (Signed)
Pt returns from left side rib pain due to fall. Seen yesterday for the same.

## 2018-09-25 NOTE — ED Triage Notes (Signed)
Thinks it is his sciatic nerve . Said scans yesterday were fine

## 2018-09-25 NOTE — ED Notes (Signed)
Patient states that he has not taken any of his meds today. Patient states that he is having chest pain on left side. Patient also states that he is having pain down left leg at this time.

## 2018-09-26 MED ORDER — MORPHINE SULFATE 15 MG PO TABS: 15.0000 mg | ORAL_TABLET | Freq: Four times a day (QID) | ORAL | 0 refills | Status: DC | PRN

## 2018-09-26 MED ORDER — TIZANIDINE HCL 2 MG PO TABS: 2.0000 mg | ORAL_TABLET | Freq: Three times a day (TID) | ORAL | 0 refills | Status: DC | PRN

## 2018-09-26 NOTE — Discharge Instructions (Signed)
Take your baby Asprin as prescribed.  Take Tylenol as needed for mild to moderate pain. Use the gel for your knees on your side to help with pain. Use tizanidine as needed for muscle spasm/pain. Use morphine as needed for vera breakthrough pain.  Have caution, this is a narcotic medicine.  Do not drive or operate heavy machinery while taking this medicine. Use ice packs or heating pads if this helps control your pain. You will likely have continued muscle stiffness and soreness over the next couple days.  Follow-up with primary care for further evaluation of your symptoms.  Return to the emergency room if you develop vision changes, vomiting, slurred speech, numbness, loss of bowel or bladder control, or any new or worsening symptoms.

## 2018-09-26 NOTE — ED Notes (Signed)
Pt verbally states he understands discharge information  

## 2018-09-26 NOTE — ED Notes (Signed)
Called patients wife. Wife states she will not be able to pick up her husband until the sun comes out because she cannot drive at night.
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
## 2018-10-09 ENCOUNTER — Telehealth: Payer: Self-pay

## 2018-10-09 NOTE — Telephone Encounter (Signed)
I called pt about his appt on Monday with Janett Billow NP. I explain it will be a mychart visit due to Edgar.19. Pt stated he fell a week ago and unable to walk has bruise ribs. Pt advise me to reschedule him in August or September when he will be better. He put his wife Thayer Headings on the phone. I tried to r/s with Thayer Headings and she did not know when they can come in. The wife stated she does not drive because of her vision and her husband is healing.I advise the wife to call our office back when they are ready to r/s. PT is seeing his primary for his injuries.

## 2018-10-11 ENCOUNTER — Inpatient Hospital Stay (HOSPITAL_COMMUNITY)
Admission: AD | Admit: 2018-10-11 | Discharge: 2018-10-16 | DRG: 176 | Disposition: A | Discharge status: Rehabilitation Facility | Payer: Medicare Other | Source: Other Acute Inpatient Hospital | Attending: Internal Medicine | Admitting: Internal Medicine

## 2018-10-11 DIAGNOSIS — R269 Unspecified abnormalities of gait and mobility: Secondary | ICD-10-CM | POA: Diagnosis present

## 2018-10-11 DIAGNOSIS — E669 Obesity, unspecified: Secondary | ICD-10-CM | POA: Diagnosis present

## 2018-10-11 DIAGNOSIS — N3289 Other specified disorders of bladder: Secondary | ICD-10-CM | POA: Diagnosis present

## 2018-10-11 DIAGNOSIS — I10 Essential (primary) hypertension: Secondary | ICD-10-CM | POA: Diagnosis present

## 2018-10-11 DIAGNOSIS — Z9079 Acquired absence of other genital organ(s): Secondary | ICD-10-CM | POA: Diagnosis not present

## 2018-10-11 DIAGNOSIS — G252 Other specified forms of tremor: Secondary | ICD-10-CM

## 2018-10-11 DIAGNOSIS — Z9841 Cataract extraction status, right eye: Secondary | ICD-10-CM | POA: Diagnosis not present

## 2018-10-11 DIAGNOSIS — E739 Lactose intolerance, unspecified: Secondary | ICD-10-CM | POA: Diagnosis present

## 2018-10-11 DIAGNOSIS — I82452 Acute embolism and thrombosis of left peroneal vein: Secondary | ICD-10-CM | POA: Diagnosis present

## 2018-10-11 DIAGNOSIS — R601 Generalized edema: Secondary | ICD-10-CM

## 2018-10-11 DIAGNOSIS — Z8249 Family history of ischemic heart disease and other diseases of the circulatory system: Secondary | ICD-10-CM | POA: Diagnosis not present

## 2018-10-11 DIAGNOSIS — J9811 Atelectasis: Secondary | ICD-10-CM | POA: Diagnosis present

## 2018-10-11 DIAGNOSIS — I2699 Other pulmonary embolism without acute cor pulmonale: Principal | ICD-10-CM | POA: Diagnosis present

## 2018-10-11 DIAGNOSIS — Z1159 Encounter for screening for other viral diseases: Secondary | ICD-10-CM | POA: Diagnosis not present

## 2018-10-11 DIAGNOSIS — K5901 Slow transit constipation: Secondary | ICD-10-CM

## 2018-10-11 DIAGNOSIS — M25561 Pain in right knee: Secondary | ICD-10-CM | POA: Diagnosis not present

## 2018-10-11 DIAGNOSIS — N401 Enlarged prostate with lower urinary tract symptoms: Secondary | ICD-10-CM | POA: Diagnosis present

## 2018-10-11 DIAGNOSIS — R259 Unspecified abnormal involuntary movements: Secondary | ICD-10-CM | POA: Diagnosis not present

## 2018-10-11 DIAGNOSIS — R310 Gross hematuria: Secondary | ICD-10-CM | POA: Diagnosis present

## 2018-10-11 DIAGNOSIS — Z9842 Cataract extraction status, left eye: Secondary | ICD-10-CM

## 2018-10-11 DIAGNOSIS — R319 Hematuria, unspecified: Secondary | ICD-10-CM | POA: Diagnosis not present

## 2018-10-11 DIAGNOSIS — Z8673 Personal history of transient ischemic attack (TIA), and cerebral infarction without residual deficits: Secondary | ICD-10-CM | POA: Diagnosis not present

## 2018-10-11 DIAGNOSIS — R5381 Other malaise: Secondary | ICD-10-CM | POA: Diagnosis not present

## 2018-10-11 DIAGNOSIS — R609 Edema, unspecified: Secondary | ICD-10-CM | POA: Diagnosis not present

## 2018-10-11 DIAGNOSIS — Z880 Allergy status to penicillin: Secondary | ICD-10-CM | POA: Diagnosis not present

## 2018-10-11 DIAGNOSIS — M1990 Unspecified osteoarthritis, unspecified site: Secondary | ICD-10-CM | POA: Diagnosis present

## 2018-10-11 DIAGNOSIS — Z7982 Long term (current) use of aspirin: Secondary | ICD-10-CM

## 2018-10-11 DIAGNOSIS — M170 Bilateral primary osteoarthritis of knee: Secondary | ICD-10-CM | POA: Diagnosis not present

## 2018-10-11 DIAGNOSIS — R740 Nonspecific elevation of levels of transaminase and lactic acid dehydrogenase [LDH]: Secondary | ICD-10-CM | POA: Diagnosis not present

## 2018-10-11 DIAGNOSIS — R531 Weakness: Secondary | ICD-10-CM | POA: Diagnosis present

## 2018-10-11 DIAGNOSIS — R251 Tremor, unspecified: Secondary | ICD-10-CM | POA: Diagnosis present

## 2018-10-11 DIAGNOSIS — K5900 Constipation, unspecified: Secondary | ICD-10-CM | POA: Diagnosis present

## 2018-10-11 DIAGNOSIS — Z79899 Other long term (current) drug therapy: Secondary | ICD-10-CM

## 2018-10-11 DIAGNOSIS — Z886 Allergy status to analgesic agent status: Secondary | ICD-10-CM

## 2018-10-11 DIAGNOSIS — Z6830 Body mass index (BMI) 30.0-30.9, adult: Secondary | ICD-10-CM | POA: Diagnosis not present

## 2018-10-11 DIAGNOSIS — Z961 Presence of intraocular lens: Secondary | ICD-10-CM | POA: Diagnosis present

## 2018-10-11 DIAGNOSIS — E871 Hypo-osmolality and hyponatremia: Secondary | ICD-10-CM | POA: Diagnosis present

## 2018-10-11 DIAGNOSIS — Z91030 Bee allergy status: Secondary | ICD-10-CM

## 2018-10-11 DIAGNOSIS — R338 Other retention of urine: Secondary | ICD-10-CM | POA: Diagnosis present

## 2018-10-11 DIAGNOSIS — D62 Acute posthemorrhagic anemia: Secondary | ICD-10-CM | POA: Diagnosis not present

## 2018-10-11 LAB — CBC WITH DIFFERENTIAL/PLATELET
Abs Immature Granulocytes: 0.03 10*3/uL (ref 0.00–0.07)
Basophils Absolute: 0 10*3/uL (ref 0.0–0.1)
Basophils Relative: 0 %
Eosinophils Absolute: 0.3 10*3/uL (ref 0.0–0.5)
Eosinophils Relative: 3 %
HCT: 43 % (ref 39.0–52.0)
Hemoglobin: 14.2 g/dL (ref 13.0–17.0)
Immature Granulocytes: 0 %
Lymphocytes Relative: 20 %
Lymphs Abs: 2.1 10*3/uL (ref 0.7–4.0)
MCH: 32 pg (ref 26.0–34.0)
MCHC: 33 g/dL (ref 30.0–36.0)
MCV: 96.8 fL (ref 80.0–100.0)
Monocytes Absolute: 1 10*3/uL (ref 0.1–1.0)
Monocytes Relative: 10 %
Neutro Abs: 6.9 10*3/uL (ref 1.7–7.7)
Neutrophils Relative %: 67 %
Platelets: 372 10*3/uL (ref 150–400)
RBC: 4.44 MIL/uL (ref 4.22–5.81)
RDW: 12.7 % (ref 11.5–15.5)
WBC: 10.4 10*3/uL (ref 4.0–10.5)
nRBC: 0 % (ref 0.0–0.2)

## 2018-10-11 LAB — BASIC METABOLIC PANEL
Anion gap: 11 (ref 5–15)
BUN: 19 mg/dL (ref 8–23)
CO2: 23 mmol/L (ref 22–32)
Calcium: 9.5 mg/dL (ref 8.9–10.3)
Chloride: 102 mmol/L (ref 98–111)
Creatinine, Ser: 1.01 mg/dL (ref 0.61–1.24)
GFR calc Af Amer: >60 mL/min (ref >60)
GFR calc non Af Amer: >60 mL/min (ref >60)
Glucose, Bld: 120 mg/dL — ABNORMAL HIGH (ref 70–99)
Potassium: 4 mmol/L (ref 3.5–5.1)
Sodium: 136 mmol/L (ref 135–145)

## 2018-10-11 LAB — SARS CORONAVIRUS 2: SARS Coronavirus 2: NOT DETECTED

## 2018-10-11 LAB — HEPARIN LEVEL (UNFRACTIONATED): Heparin Unfractionated: 0.2 IU/mL — ABNORMAL LOW (ref 0.30–0.70)

## 2018-10-11 MED ORDER — ONDANSETRON HCL 4 MG/2ML IJ SOLN: 4.0000 mg | Freq: Four times a day (QID) | INTRAMUSCULAR | Status: DC | PRN

## 2018-10-11 MED ORDER — HEPARIN (PORCINE) 25000 UT/250ML-% IV SOLN
1500.0000 [IU]/h | INTRAVENOUS | Status: AC
  Administered 2018-10-11: 1600 [IU]/h via INTRAVENOUS
  Administered 2018-10-12: 1800 [IU]/h via INTRAVENOUS
  Administered 2018-10-13 – 2018-10-15 (×4): 1500 [IU]/h via INTRAVENOUS
  Filled 2018-10-11 (×6): qty 250

## 2018-10-11 MED ORDER — ONDANSETRON HCL 4 MG PO TABS: 4.0000 mg | ORAL_TABLET | Freq: Four times a day (QID) | ORAL | Status: DC | PRN

## 2018-10-11 MED ORDER — SALINE SPRAY 0.65 % NA SOLN
1.0000 | NASAL | Status: DC | PRN
  Administered 2018-10-11: 1 via NASAL
  Filled 2018-10-11: qty 44

## 2018-10-11 MED ORDER — SODIUM CHLORIDE 0.45 % IV SOLN
INTRAVENOUS | Status: DC
  Administered 2018-10-11 – 2018-10-13 (×4): via INTRAVENOUS
  Administered 2018-10-14: 75 mL/h via INTRAVENOUS
  Administered 2018-10-15: 02:00:00 via INTRAVENOUS

## 2018-10-11 MED ORDER — ACETAMINOPHEN 325 MG PO TABS
650.0000 mg | ORAL_TABLET | Freq: Four times a day (QID) | ORAL | Status: DC | PRN
  Administered 2018-10-11 – 2018-10-13 (×4): 650 mg via ORAL
  Filled 2018-10-11 (×5): qty 2
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
## 2018-10-11 NOTE — Progress Notes (Signed)
[place] for Heparin Indication: pulmonary embolus  Allergies  Allergen Reactions  . Aspirin     Had hives due to bee sting while on asprin  . Bee Venom Hives and Swelling  . 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 Lactose Intolerance (Gi) Diarrhea  . Penicillins Rash    Patient Measurements: Weight: 247 lb (112 kg) Heparin dosing weight = 108 kg  Vital Signs: Temp: 97.8 F (36.6 C) (06/19 2157) Temp Source: Oral (06/19 2157) BP: 128/85 (06/19 2157) Pulse Rate: 107 (06/19 2157)  Labs: Recent Labs    10/11/18 2243 10/11/18 2249  HGB 14.2  --   HCT 43.0  --   PLT 372  --   HEPARINUNFRC  --  0.20*  CREATININE 1.01  --     Estimated Creatinine Clearance: 84 mL/min (by C-G formula based on SCr of 1.01 mg/dL).   Assessment: 78 year old male originally admitted to [facility] for hematuria found to have PE.  Started on IV heparin prior to transfer.  Heparin level is sub-therapeutic.  No issue with heparin infusion since patient arrived per RN.  RN also reports that patient has made minimal urine and it is tea color.  Goal of Therapy:  Heparin level 0.3-0.7 units/ml Monitor platelets by anticoagulation protocol: Yes   Plan:  Increase heparin gtt to 1800 units/hr Check 8 hr heparin level Continue to monitor hematuria  Reia Viernes D. Mina Marble, PharmD, BCPS, [place] 10/11/2018, 11:33 PM

## 2018-10-11 NOTE — Progress Notes (Signed)
ANTICOAGULATION CONSULT NOTE - Follow Up Consult  Pharmacy Consult for Heparin Indication: pulmonary embolus  Allergies  Allergen Reactions  . Aspirin     Had hives due to bee sting while on asprin  . Bee Venom Hives and Swelling  . 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 Lactose Intolerance (Gi) Diarrhea  . Penicillins Rash    Patient Measurements: Weight: 247 lb (112 kg)   Vital Signs: Temp: 97.8 F (36.6 C) (06/19 2157) Temp Source: Oral (06/19 2157) BP: 128/85 (06/19 2157) Pulse Rate: 107 (06/19 2157)  Labs: No results for input(s): HGB, HCT, PLT, APTT, LABPROT, INR, HEPARINUNFRC, HEPRLOWMOCWT, CREATININE, CKTOTAL, CKMB, TROPONINI in the last 72 hours.  Estimated Creatinine Clearance: 85.7 mL/min (by C-G formula based on SCr of 0.99 mg/dL).   Assessment: 78 year old male originally admitted to [facility] for hematuria found to have PE.  Started on IV heparin with 5000 unit bolus and drip at 1600 units / hr at 4 pm   Goal of Therapy:  Heparin level 0.3-0.7 units/ml Monitor platelets by anticoagulation protocol: Yes   Plan:  Continue heparin at 1600 units / hr Heparin level now Daily heparin level, CBC  Monitor for hematuria  Thank you Anette Guarneri, PharmD 10/11/2018,10:37 PM

## 2018-10-12 ENCOUNTER — Other Ambulatory Visit: Payer: Self-pay

## 2018-10-12 ENCOUNTER — Inpatient Hospital Stay (HOSPITAL_COMMUNITY): Payer: Medicare Other

## 2018-10-12 ENCOUNTER — Encounter (HOSPITAL_COMMUNITY): Payer: Self-pay

## 2018-10-12 DIAGNOSIS — I2699 Other pulmonary embolism without acute cor pulmonale: Secondary | ICD-10-CM

## 2018-10-12 DIAGNOSIS — R609 Edema, unspecified: Secondary | ICD-10-CM

## 2018-10-12 LAB — COMPREHENSIVE METABOLIC PANEL
ALT: 18 U/L (ref 0–44)
AST: 18 U/L (ref 15–41)
Albumin: 3 g/dL — ABNORMAL LOW (ref 3.5–5.0)
Alkaline Phosphatase: 87 U/L (ref 38–126)
Anion gap: 12 (ref 5–15)
BUN: 21 mg/dL (ref 8–23)
CO2: 21 mmol/L — ABNORMAL LOW (ref 22–32)
Calcium: 9.3 mg/dL (ref 8.9–10.3)
Chloride: 103 mmol/L (ref 98–111)
Creatinine, Ser: 0.91 mg/dL (ref 0.61–1.24)
GFR calc Af Amer: >60 mL/min (ref >60)
GFR calc non Af Amer: >60 mL/min (ref >60)
Glucose, Bld: 108 mg/dL — ABNORMAL HIGH (ref 70–99)
Potassium: 3.8 mmol/L (ref 3.5–5.1)
Sodium: 136 mmol/L (ref 135–145)
Total Bilirubin: 1.1 mg/dL (ref 0.3–1.2)
Total Protein: 7 g/dL (ref 6.5–8.1)

## 2018-10-12 LAB — URINALYSIS, ROUTINE W REFLEX MICROSCOPIC

## 2018-10-12 LAB — CBC
HCT: 42.5 % (ref 39.0–52.0)
Hemoglobin: 13.7 g/dL (ref 13.0–17.0)
MCH: 31.5 pg (ref 26.0–34.0)
MCHC: 32.2 g/dL (ref 30.0–36.0)
MCV: 97.7 fL (ref 80.0–100.0)
Platelets: 365 10*3/uL (ref 150–400)
RBC: 4.35 MIL/uL (ref 4.22–5.81)
RDW: 12.8 % (ref 11.5–15.5)
WBC: 10.9 10*3/uL — ABNORMAL HIGH (ref 4.0–10.5)
nRBC: 0 % (ref 0.0–0.2)

## 2018-10-12 LAB — URINALYSIS, MICROSCOPIC (REFLEX)
Bacteria, UA: NONE SEEN
RBC / HPF: >50 RBC/hpf (ref 0–5)

## 2018-10-12 LAB — HEPARIN LEVEL (UNFRACTIONATED)
Heparin Unfractionated: 0.96 IU/mL — ABNORMAL HIGH (ref 0.30–0.70)
Heparin Unfractionated: 1.01 IU/mL — ABNORMAL HIGH (ref 0.30–0.70)
Heparin Unfractionated: 0.45 IU/mL (ref 0.30–0.70)

## 2018-10-12 MED ORDER — MECLIZINE HCL 25 MG PO TABS: 25.0000 mg | ORAL_TABLET | Freq: Three times a day (TID) | ORAL | Status: DC | PRN

## 2018-10-12 MED ORDER — CALCIUM CARBONATE-VITAMIN D 500-200 MG-UNIT PO TABS
1.0000 | ORAL_TABLET | ORAL | Status: DC
  Administered 2018-10-12 – 2018-10-16 (×5): 1 via ORAL
  Filled 2018-10-12 (×5): qty 1

## 2018-10-12 MED ORDER — DUTASTERIDE 0.5 MG PO CAPS
0.5000 mg | ORAL_CAPSULE | Freq: Every day | ORAL | Status: DC
  Administered 2018-10-12 – 2018-10-16 (×5): 0.5 mg via ORAL
  Filled 2018-10-12 (×5): qty 1

## 2018-10-12 MED ORDER — FLUTICASONE PROPIONATE 50 MCG/ACT NA SUSP
1.0000 | Freq: Every day | NASAL | Status: DC | PRN
  Filled 2018-10-12: qty 16

## 2018-10-12 MED ORDER — TIZANIDINE HCL 4 MG PO TABS
2.0000 mg | ORAL_TABLET | Freq: Three times a day (TID) | ORAL | Status: DC | PRN
  Administered 2018-10-12 – 2018-10-13 (×5): 2 mg via ORAL
  Filled 2018-10-12 (×5): qty 1

## 2018-10-12 MED ORDER — ATORVASTATIN CALCIUM 10 MG PO TABS
20.0000 mg | ORAL_TABLET | Freq: Every day | ORAL | Status: DC
  Administered 2018-10-12 – 2018-10-15 (×4): 20 mg via ORAL
  Filled 2018-10-12 (×4): qty 2

## 2018-10-12 MED ORDER — POLYETHYLENE GLYCOL 3350 17 G PO PACK
17.0000 g | PACK | Freq: Every day | ORAL | Status: DC
  Administered 2018-10-12 – 2018-10-14 (×3): 17 g via ORAL
  Filled 2018-10-12 (×5): qty 1

## 2018-10-12 MED ORDER — PROPRANOLOL HCL 10 MG PO TABS
10.0000 mg | ORAL_TABLET | Freq: Three times a day (TID) | ORAL | Status: DC
  Administered 2018-10-12 – 2018-10-16 (×14): 10 mg via ORAL
  Filled 2018-10-12 (×16): qty 1

## 2018-10-12 MED ORDER — VITAMIN C 500 MG PO TABS
500.0000 mg | ORAL_TABLET | Freq: Two times a day (BID) | ORAL | Status: DC
  Administered 2018-10-12 – 2018-10-16 (×9): 500 mg via ORAL
  Filled 2018-10-12 (×9): qty 1

## 2018-10-12 MED ORDER — BISACODYL 10 MG RE SUPP
10.0000 mg | Freq: Every day | RECTAL | Status: DC
  Administered 2018-10-12 – 2018-10-14 (×2): 10 mg via RECTAL
  Filled 2018-10-12 (×3): qty 1

## 2018-10-12 MED ORDER — OMEGA-3-ACID ETHYL ESTERS 1 G PO CAPS
1.0000 g | ORAL_CAPSULE | ORAL | Status: DC
  Administered 2018-10-12 – 2018-10-16 (×3): 1 g via ORAL
  Filled 2018-10-12 (×3): qty 1

## 2018-10-12 MED ORDER — ACETAMINOPHEN 500 MG PO TABS: 500.0000 mg | ORAL_TABLET | ORAL | Status: DC | PRN

## 2018-10-12 NOTE — Progress Notes (Signed)
[place] for Heparin Indication: pulmonary embolus  Allergies  Allergen Reactions  . Aspirin     Had hives due to bee sting while on asprin  . Bee Venom Hives and Swelling  . 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 Lactose Intolerance (Gi) Diarrhea  . Penicillins Rash    Patient Measurements: Height: 6\' 3"  (190.5 cm) Weight: 247 lb (112 kg) IBW/kg (Calculated) : 84.5 Heparin dosing weight = 108 kg  Vital Signs: Temp: 98 F (36.7 C) (06/20 1134) Temp Source: Oral (06/20 1134) BP: 118/79 (06/20 1134) Pulse Rate: 82 (06/20 1134)  Labs: Recent Labs    10/11/18 2243 10/11/18 2249 10/12/18 0735 10/12/18 1004  HGB 14.2  --  13.7  --   HCT 43.0  --  42.5  --   PLT 372  --  365  --   HEPARINUNFRC  --  0.20* 0.96* 1.01*  CREATININE 1.01  --  0.91  --     Estimated Creatinine Clearance: 93.3 mL/min (by C-G formula based on SCr of 0.91 mg/dL).   Assessment: 78 year old male originally admitted to [facility] for hematuria found to have PE.  Started on IV heparin prior to transfer.  Heparin level is supratherapeutic at 1.01 on 1800 units/hr. Hgb stable, plts ok, Scr 0.91, no reports of bleeding per RN  Goal of Therapy:  Heparin level 0.3-0.7 units/ml Monitor platelets by anticoagulation protocol: Yes   Plan:  Hold heparin x1 hour Decrease heparin gtt to 1500 units/hr Check 8 hr heparin level Monitor daily heparin level, CBC, s/sx bleeding  Juanell Fairly, PharmD PGY1 Pharmacy Resident 10/12/2018, 11:49 AM

## 2018-10-12 NOTE — Progress Notes (Signed)
PROGRESS NOTE  [name] NAT:[id number] DOB: May 27, 1940 DOA: 10/11/2018 PCP: Lyman Bishop, DO   LOS: 1 day   Brief narrative: Patient is a 78 y.o. male with medical history significant of hypertension, BPH and possible prostate cancer, resting tremors, CVA, severe constipation lives at home with his wife.   I called his wife who helped with the details of his history. On 6/2, patient tripped and fell while walking on the yard and hit the left side of his body on the side rail.  He went to the ED.  Had a CT scan of chest abdomen pelvis which did not show any traumatic injury.  He has been taking extra strength Tylenol at home with inadequate pain control.  He has decreased mobility and progressive generalized weakness since then but not bedbound.   On 6/18, he had difficulty urinating and also passed some blood.   6/19, he went to [facility] with above complaints.  I reviewed the CT scan of abdomen and pelvis as well as CT angiogram of the chest obtained at [facility].  CT chest abdomen pelvis was obtained which showed irregular nodular thickening of the prostate causing mass-effect on the bladder base.  Floating material/debris within the bladder compatible with blood products and/or infectious debris's.  No obstructing ureteral calculi or hydronephrosis.  CT Angio of chest was positive for bilateral lower lobe segmental pulmonary embolism associated with atelectasis.   He was started on heparin drip and transferred to [place].  On arrival to our hospital, patient was hemodynamically stable.  Initial blood work with WBC count slightly elevated to 11,000. COVID-19 test negative.  Subjective: Patient was seen and examined this morning.  Pleasant elderly Caucasian male.  Mild to moderate distress because of persistent bladder spasms. Remains on heparin drip. No urine noted in Foley catheter or urine bag.  Bladder scan done.  1800 mL of retained urine noted.  Foley catheter inserted.   Cloudy urine with debris as well as some blood clots noted.   Assessment/Plan:  Principal Problem:   Pulmonary embolism and infarction [facility]) Active Problems:   Hypertension   Hyponatremia   Hematuria  Hematuria/urinary retention -Patient last urinated on 6/18.  1800 mL of urine noted on bladder scan.  Foley catheter inserted. -Urology Dr. Alyson Ingles consulted. -CT scan of abdomen with evidence of moderate prostatic enlargement.  There is also questionable history of prostatic cancer which the wife is not aware of. -No clinical evidence of urinary tract infection.  Urinalysis sent. -Continue Avodart  Bilateral lower lobe pulmonary embolism -CT Angio of chest was positive for bilateral lower lobe segmental pulmonary embolism associated with atelectasis.   - Patient had a fall on 6/2 hitting head chest, no rib fracture and CT scan.  He progressively became weak, less mobile and started getting short of breath.  -No history of DVT/PE in the past.  He apparently was on aspirin and Plavix in the past for TIA.  Never on an anticoagulant. -Obtain ultrasound duplex lower extremity venous to rule out DVT. -Currently on heparin drip.  Continue the same.  History of TIA -  -On aspirin statin.  Body mass index is 30.87 kg/m. Mobility: Encourage ambulation.  PT evaluation ordered. Diet: Regular diet DVT prophylaxis:  Heparin drip Code Status:   Code Status: Full Code  Family Communication:  Spoke with patient's wife over the phone Expected Discharge:  Inpatient for next 2 to 3 days.  Pending PT eval  Consultants:  Urology  Procedures:  None  Antimicrobials:  Anti-infectives (From admission, onward)   None      Infusions:  . sodium chloride 75 mL/hr at 10/11/18 2258  . heparin 1,800 Units/hr (10/12/18 1016)    Scheduled Meds: . atorvastatin  20 mg Oral q1800  . calcium-vitamin D  1 tablet Oral [id number]  . dutasteride  0.5 mg Oral Daily  . omega-3 acid ethyl esters  1 g Oral  QODAY  . polyethylene glycol  17 g Oral Daily  . propranolol  10 mg Oral TID  . vitamin C with rose hips  500 mg Oral BID    PRN meds: acetaminophen, fluticasone, meclizine, ondansetron **OR** ondansetron (ZOFRAN) IV, sodium chloride, tiZANidine   Objective: Vitals:   10/12/18 0341 10/12/18 0851  BP: 108/86 125/80  Pulse: 98 99  Resp: 17 17  Temp: 98 F (36.7 C) 98.2 F (36.8 C)  SpO2: 96% 96%    Intake/Output Summary (Last 24 hours) at 10/12/2018 1030 Last data filed at 10/11/2018 2335 Gross per 24 hour  Intake 56.12 ml  Output 15 ml  Net 41.12 ml   Filed Weights   10/11/18 2225  Weight: 112 kg   Weight change:  Body mass index is 30.87 kg/m.   Physical Exam: General exam: Mild to moderate distress because of persistent bladder spasm, relieved after Foley catheter insertion Skin: No rashes, lesions or ulcers. HEENT: Atraumatic, normocephalic, supple neck, no obvious bleeding Lungs: Clear to auscultation bilaterally, CVS: Regular rate and rhythm, no murmur GI/Abd soft, nontender, nondistended, bowel sound present CNS: Alert, awake, oriented x3 Psychiatry: Mood appropriate Extremities: No pedal edema, no calf tenderness  Data Review: I have personally reviewed the laboratory data and studies available.  Recent Labs  Lab 10/11/18 2243 10/12/18 0735  WBC 10.4 10.9*  NEUTROABS 6.9  --   HGB 14.2 13.7  HCT 43.0 42.5  MCV 96.8 97.7  PLT 372 365   Recent Labs  Lab 10/11/18 2243 10/12/18 0735  NA 136 136  K 4.0 3.8  CL 102 103  CO2 23 21*  GLUCOSE 120* 108*  BUN 19 21  CREATININE 1.01 0.91  CALCIUM 9.5 9.3    Lorin GlassBinaya Aster Screws, MD  [facility] 10/12/2018

## 2018-10-12 NOTE — Plan of Care (Signed)
POC in place and progressing 

## 2018-10-12 NOTE — Progress Notes (Signed)
[place] for Heparin Indication: pulmonary embolus  Allergies  Allergen Reactions  . Aspirin     Had hives due to bee sting while on asprin  . Bee Venom Hives and Swelling  . 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 Lactose Intolerance (Gi) Diarrhea  . Penicillins Rash    Patient Measurements: Height: 6\' 3"  (190.5 cm) Weight: 247 lb (112 kg) IBW/kg (Calculated) : 84.5 Heparin dosing weight = 108 kg  Vital Signs: Temp: 97.7 F (36.5 C) (06/20 1930) Temp Source: Oral (06/20 1930) BP: 103/80 (06/20 1930) Pulse Rate: 85 (06/20 1930)  Labs: Recent Labs    10/11/18 2243  10/12/18 0735 10/12/18 1004 10/12/18 1952  HGB 14.2  --  13.7  --   --   HCT 43.0  --  42.5  --   --   PLT 372  --  365  --   --   HEPARINUNFRC  --    < > 0.96* 1.01* 0.45  CREATININE 1.01  --  0.91  --   --    < > = values in this interval not displayed.    Estimated Creatinine Clearance: 93.3 mL/min (by C-G formula based on SCr of 0.91 mg/dL).   Assessment: 78 year old male originally admitted to [facility] for hematuria found to have PE.  Started on IV heparin prior to transfer.  Heparin level came back therapeutic at 0.45, on 1500 units/hr. No s/sx of bleeding. No infusion issues per nursing.   Goal of Therapy:  Heparin level 0.3-0.7 units/ml Monitor platelets by anticoagulation protocol: Yes   Plan:  Continue heparin infusion at 1500 units/hr Check heparin level in 8 hours with AM labs Monitor daily heparin level, CBC, s/sx bleeding  Antonietta Jewel, PharmD, BCCCP Clinical Pharmacist  Pager: [phone] Phone: [phone] 10/12/2018, 8:38 PM

## 2018-10-12 NOTE — Progress Notes (Signed)
Bladder scanned patient, showed 1800 plus. Spoke to Dr. Pietro Cassis and notified of bladder scan results and he advised to put in Foley Cath.  Placed Foley and got 2500 urine return. Will continue to monitor

## 2018-10-12 NOTE — Progress Notes (Signed)
Pt arrived to 4E19 via [facility]. VSS. CHG wipe down done. Tele placed and verified x2. Rapid COVID test done and is negative. Pt denies SOB. Only complaint is spasm-like bladder(?) pain that comes and goes, MD aware and up to see pt. Pt and wife updated on plan of care, oriented to room and cal light. Will continue to monitor.  Jaymes Graff, RN
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
## 2018-10-12 NOTE — Progress Notes (Signed)
VASCULAR LAB PRELIMINARY  PRELIMINARY  PRELIMINARY  PRELIMINARY  Bilateral lower extremity venous duplex completed.    Preliminary report:  See CV proc for preliminary results.   Gave results to patient's RN  Jaquari Reckner, RVT 10/12/2018, 1:08 PM

## 2018-10-13 LAB — CBC
HCT: 37.6 % — ABNORMAL LOW (ref 39.0–52.0)
Hemoglobin: 12.2 g/dL — ABNORMAL LOW (ref 13.0–17.0)
MCH: 31.4 pg (ref 26.0–34.0)
MCHC: 32.4 g/dL (ref 30.0–36.0)
MCV: 96.9 fL (ref 80.0–100.0)
Platelets: 351 10*3/uL (ref 150–400)
RBC: 3.88 MIL/uL — ABNORMAL LOW (ref 4.22–5.81)
RDW: 12.7 % (ref 11.5–15.5)
WBC: 11.2 10*3/uL — ABNORMAL HIGH (ref 4.0–10.5)
nRBC: 0 % (ref 0.0–0.2)

## 2018-10-13 LAB — HEPARIN LEVEL (UNFRACTIONATED): Heparin Unfractionated: 0.51 IU/mL (ref 0.30–0.70)

## 2018-10-13 MED ORDER — LIDOCAINE 5 % EX PTCH
1.0000 | MEDICATED_PATCH | CUTANEOUS | Status: DC
  Administered 2018-10-16: 1 via TRANSDERMAL
  Filled 2018-10-13 (×2): qty 1

## 2018-10-13 MED ORDER — PHENOL 1.4 % MT LIQD
1.0000 | OROMUCOSAL | Status: DC | PRN
  Administered 2018-10-13: 1 via OROMUCOSAL
  Filled 2018-10-13: qty 177
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
## 2018-10-13 NOTE — Progress Notes (Signed)
[place] for Heparin Indication: pulmonary embolus  Allergies  Allergen Reactions  . Aspirin     Had hives due to bee sting while on asprin  . Bee Venom Hives and Swelling  . 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 Lactose Intolerance (Gi) Diarrhea  . Penicillins Rash    Patient Measurements: Height: 6\' 3"  (190.5 cm) Weight: 247 lb (112 kg) IBW/kg (Calculated) : 84.5 Heparin dosing weight = 108 kg  Vital Signs: Temp: 97.7 F (36.5 C) (06/21 0350) Temp Source: Oral (06/21 0350) BP: 103/67 (06/21 0350) Pulse Rate: 93 (06/21 0350)  Labs: Recent Labs    10/11/18 2243  10/12/18 0735 10/12/18 1004 10/12/18 1952 10/13/18 0435  HGB 14.2  --  13.7  --   --  12.2*  HCT 43.0  --  42.5  --   --  37.6*  PLT 372  --  365  --   --  351  HEPARINUNFRC  --    < > 0.96* 1.01* 0.45 0.51  CREATININE 1.01  --  0.91  --   --   --    < > = values in this interval not displayed.    Estimated Creatinine Clearance: 93.3 mL/min (by C-G formula based on SCr of 0.91 mg/dL).   Assessment: 78 year old male originally admitted to [facility] for hematuria found to have PE.  Started on IV heparin prior to transfer.  Heparin level remains therapeutic this AM at 0.51, on 1500 units/hr. Hgb down to 12.2, plts ok, no s/sx of bleeding per RN.  Goal of Therapy:  Heparin level 0.3-0.7 units/ml Monitor platelets by anticoagulation protocol: Yes   Plan:  Continue heparin infusion at 1500 units/hr Monitor daily heparin level, CBC, s/sx bleeding   Juanell Fairly, PharmD PGY1 Pharmacy Resident 10/13/2018, 8:16 AM

## 2018-10-13 NOTE — Progress Notes (Signed)
PROGRESS NOTE  [name] EAV:[id number] DOB: 02-May-1940 DOA: 10/11/2018 PCP: Koren ShiverMasneri, Shannon M, DO   LOS: 2 days   Brief narrative: Patientis a 78 y.o.malewith medical history significant ofhypertension, BPH s/p TURP, resting tremors, CVA, severe constipation lives at home with his wife.   History was obtained from patient as well as his wife over the phone. On 6/2, patient tripped and fell while walking on the yard and hit the left side of his body on the side rail.  He went to the ED.  Had a CT scan of chest abdomen pelvis which did not show any traumatic injury.  He has been taking extra strength Tylenol at home with inadequate pain control.  He has decreased mobility and progressive generalized weakness since then but not bedbound.   On 6/18, he had difficulty urinating and also passed some blood.   6/19, [facility] above complaints.  I reviewed the CT scan of abdomen and pelvis as well as CT [facility].  CT chest abdomen pelvis was obtained which showed irregular nodular thickening of the prostate causing mass-effect on the bladder base.  Floating material/debris within the bladder compatible with blood products and/or infectious debris's.  No obstructing ureteral calculi or hydronephrosis.  CT Angio of chest was positive for bilateral lower lobe segmental pulmonary embolism associated with atelectasis.   He was started on heparin drip and transferred to us.  On arrival to our hospital, patient was hemodynamically stable.  Initial blood work with WBC count slightly elevated to 11,000. COVID-19 test negative. Bladder scan noted more than 1800 urine.  Foley catheter was placed, 2 L of urine drained, also noted bloody urine towards the end.  Subjective: Patient was seen and examined this morning.  Feels better after Foley catheter placement yesterday.  Bladder spasm have become less frequent now. Chart reviewed. No fever,  blood pressure and low normal range.  WBC count 11.2, hemoglobin 12.2.  Assessment/Plan:  Principal Problem:   Pulmonary embolism and infarction [facility](HCC) Active Problems:   Hypertension   Hyponatremia   Hematuria  Hematuria/urinary retention -Bladder scan noted more than 1800 urine.  Foley catheter was placed, 2 L of urine drained, also noted bloody urine towards the end. -Urology consult obtained with Dr. Ronne BinningMcKenzie.  History of BPH s/p TURP.  On Avodart. -No evidence of UTI. -Recommend to maintain Foley catheter at discharge and follow-up with Dr. Mena GoesEskridge for voiding trial in 1 week. -Per urologist, gross pneumaturia is likely related to rapid decompression of his bladder.  Continue to monitor for bleeding/clots.  Bilateral lower lobe pulmonary embolism -CT Angio of chest was positive for bilateral lower lobe segmental pulmonary embolism associated with atelectasis.   -Ultrasound duplex showed left peroneal vein DVT. -Patient had a fall on 6/2 hitting head chest, no rib fracture and CT scan.  He progressively became weak, less mobile and started getting short of breath.  DVT/PE is probably related to decreased mobility. -No history of DVT/PE in the past.  He apparently was on aspirin and Plavix in the past for TIA.  Never on an anticoagulant. -Currently on heparin drip.  Continue the same.  Monitor for any worsening of hematuria.  If stable, will switch to oral anticoagulation prior to discharge.  Chest wall pain/bilateral atelectasis -Patient has been having pain and secondary hypoventilation after a fall on 6/2.  He wants to avoid any form of narcotics and has been relying only on extra strength  Tylenol for pain control.  I would add lidocaine patch, incentive spirometry.  No clinical evidence of pneumonia.  History of TIA -  -On aspirin statin.  Because of the need to be on anticoagulation and high risk of bleeding, I would stop aspirin.  Mobility: Encourage ambulation.  PT  evaluation ordered. Diet: Regular diet DVT prophylaxis: Heparin drip Code Status:  Code Status: Full Code  Family Communication: Spoke with patient's wife over the phone Expected Discharge: Inpatient for next 2 to 3 days.  Pending PT eval  Consultants:  Urology  Procedures:  None  Antimicrobials:  Anti-infectives (From admission, onward)   None      Infusions:  . sodium chloride 75 mL/hr at 10/13/18 0314  . heparin 1,500 Units/hr (10/13/18 0515)    Scheduled Meds: . atorvastatin  20 mg Oral q1800  . bisacodyl  10 mg Rectal Daily  . calcium-vitamin D  1 tablet Oral BH-q7a  . dutasteride  0.5 mg Oral Daily  . omega-3 acid ethyl esters  1 g Oral QODAY  . polyethylene glycol  17 g Oral Daily  . propranolol  10 mg Oral TID  . vitamin C with rose hips  500 mg Oral BID    PRN meds: acetaminophen, fluticasone, meclizine, ondansetron **OR** ondansetron (ZOFRAN) IV, sodium chloride, tiZANidine   Objective: Vitals:   10/13/18 0350 10/13/18 0850  BP: 103/67 98/77  Pulse: 93 78  Resp: 18 17  Temp: 97.7 F (36.5 C) 98.2 F (36.8 C)  SpO2: 97% 96%    Intake/Output Summary (Last 24 hours) at 10/13/2018 1022 Last data filed at 10/13/2018 0659 Gross per 24 hour  Intake 1821.29 ml  Output 1575 ml  Net 246.29 ml   Filed Weights   10/11/18 2225  Weight: 112 kg   Weight change:  Body mass index is 30.87 kg/m.   Physical Exam: General exam: Appears calm and comfortable.  Skin: No rashes, lesions or ulcers. HEENT: Atraumatic, normocephalic, supple neck, no obvious bleeding Lungs: Clear to auscultation bilaterally, tenderness on left lateral chest wall CVS: Regular rate and rhythm, no murmur GI/Abd soft, nontender, nondistended, bowel sound present CNS: Alert, awake, oriented x3 Psychiatry: Mood appropriate Extremities: No pedal edema, no calf tenderness  Data Review: I have personally reviewed the laboratory data and studies available.  Recent Labs  Lab  10/11/18 2243 10/12/18 0735 10/13/18 0435  WBC 10.4 10.9* 11.2*  NEUTROABS 6.9  --   --   HGB 14.2 13.7 12.2*  HCT 43.0 42.5 37.6*  MCV 96.8 97.7 96.9  PLT 372 365 351   Recent Labs  Lab 10/11/18 2243 10/12/18 0735  NA 136 136  K 4.0 3.8  CL 102 103  CO2 23 21*  GLUCOSE 120* 108*  BUN 19 21  CREATININE 1.01 0.91  CALCIUM 9.5 9.3    Terrilee Croak, MD  [facility] 10/13/2018

## 2018-10-13 NOTE — Progress Notes (Signed)
Physical Therapy Note  SATURATION QUALIFICATIONS: (This note is used to comply with regulatory documentation for home oxygen)  Patient Saturations on Room Air at Rest = 92%  Patient Saturations on Room Air while Ambulating = 87%  Patient Saturations on 2 Liters of oxygen while Ambulating = 94%  Please briefly explain why patient needs home oxygen: Patient requires supplemental oxygen to maintain oxygen saturations at acceptable, safe levels with physical activity.  See also PT eval of this date for other details;   Roney Marion, [place] Pager [phone] Office [phone]

## 2018-10-14 ENCOUNTER — Ambulatory Visit: Payer: Medicare Other | Admitting: Adult Health

## 2018-10-14 LAB — BASIC METABOLIC PANEL
Anion gap: 7 (ref 5–15)
BUN: 11 mg/dL (ref 8–23)
CO2: 23 mmol/L (ref 22–32)
Calcium: 8.8 mg/dL — ABNORMAL LOW (ref 8.9–10.3)
Chloride: 108 mmol/L (ref 98–111)
Creatinine, Ser: 0.81 mg/dL (ref 0.61–1.24)
GFR calc Af Amer: >60 mL/min (ref >60)
GFR calc non Af Amer: >60 mL/min (ref >60)
Glucose, Bld: 114 mg/dL — ABNORMAL HIGH (ref 70–99)
Potassium: 3.6 mmol/L (ref 3.5–5.1)
Sodium: 138 mmol/L (ref 135–145)

## 2018-10-14 LAB — CBC
HCT: 37.7 % — ABNORMAL LOW (ref 39.0–52.0)
Hemoglobin: 12.3 g/dL — ABNORMAL LOW (ref 13.0–17.0)
MCH: 31.7 pg (ref 26.0–34.0)
MCHC: 32.6 g/dL (ref 30.0–36.0)
MCV: 97.2 fL (ref 80.0–100.0)
Platelets: 342 10*3/uL (ref 150–400)
RBC: 3.88 MIL/uL — ABNORMAL LOW (ref 4.22–5.81)
RDW: 12.7 % (ref 11.5–15.5)
WBC: 10 10*3/uL (ref 4.0–10.5)
nRBC: 0 % (ref 0.0–0.2)

## 2018-10-14 LAB — HEPARIN LEVEL (UNFRACTIONATED): Heparin Unfractionated: 0.35 IU/mL (ref 0.30–0.70)

## 2018-10-14 MED ORDER — TAMSULOSIN HCL 0.4 MG PO CAPS
0.4000 mg | ORAL_CAPSULE | Freq: Every day | ORAL | Status: DC
  Administered 2018-10-14 – 2018-10-15 (×2): 0.4 mg via ORAL
  Filled 2018-10-14 (×2): qty 1

## 2018-10-14 MED ORDER — LIDOCAINE-PRILOCAINE 2.5-2.5 % EX CREA
TOPICAL_CREAM | Freq: Every day | CUTANEOUS | Status: DC
  Administered 2018-10-14 – 2018-10-15 (×2): via TOPICAL
  Administered 2018-10-16: 1 via TOPICAL
  Filled 2018-10-14: qty 5

## 2018-10-14 MED FILL — Heparin Sod (Porcine) in NaCl IV Soln 25000 Unit/250ML-0.45%: INTRAVENOUS | Qty: 250 | Status: AC

## 2018-10-14 NOTE — Progress Notes (Signed)
PROGRESS NOTE  [name] ATF:[id number] DOB: 04/28/1940 DOA: 10/11/2018 PCP: Lyman Bishop, DO   LOS: 3 days   Brief narrative: Patientis a 78 y.o.malewith medical history significant ofhypertension, BPH s/p TURP,restingtremors, CVA, severe constipation lives at home with his wife.  History was obtained from patient as well as his wife over the phone. On 6/2, patient trippedand fellwhile walking on the yard andhit the left side of his body on the side rail. He went to the ED. Had a CT scan of chest abdomen pelvis which did not show any traumatic injury. He has been taking extra strength Tylenol at home with inadequate pain control. He has decreased mobility and progressive generalized weakness since then but not bedbound.  On 6/18, he had difficulty urinating and also passed some blood.  [facility] above complaints.  I reviewed the CT scan of abdomen and pelvis as well as CT angiogram of the chest obtained at [facility]. CT chest abdomen pelvis was obtained which showed irregular nodular thickening of the prostate causing mass-effect on the bladder base. Floating material/debris within the bladder compatible with blood products and/or infectious debris's. No obstructing ureteral calculi or hydronephrosis.  CT Angioof chest was positive for bilateral lower lobe segmental pulmonary embolism associated with atelectasis.  He was started on heparin drip and transferred to [place].  On arrival to our hospital, patient was hemodynamically stable. Initial blood work with WBC count slightly elevated to 11,000. COVID-19 test negative. Bladder scan noted more than 1800 urine.  Foley catheter was placed, 2 L of urine drained, also noted bloody urine towards the end.  Subjective: Patient was seen and examined this morning.  Pleasant elderly Caucasian male.  Not in distress.  Feels better.  Not having any bladder spasms.  Remains on heparin drip.  On 2  L oxygen by nasal cannula.  Assessment/Plan:  Principal Problem:   Pulmonary embolism and infarction [facility]) Active Problems:   Hypertension   Hyponatremia   Hematuria  Hematuria/urinary retention -Bladder scan noted more than 1800 urine.  Foley catheter was placed, 2 L of urine drained, also noted bloody urine towards the end. - History of BPH s/p TURP.  On Avodart.  -No evidence of UTI. -Patient also complaining of bladder spasms which have improved after decompression and bowel movement. Urology consult obtained. Recommendation is to maintain Foley catheter at discharge and follow-up with Dr. Junious Silk for voiding trial in 1 week. -Per urologist, gross pneumaturia is likely related to rapid decompression of his bladder.  Continue to monitor for bleeding/clots.  Added Flomax.  Bilateral lower lobe pulmonary embolism -CT Angioof chest was positive for bilateral lower lobe segmental pulmonary embolism associated with atelectasis. -Ultrasound duplex lower extremities showed left peroneal vein DVT. -Patient had a fall on 6/2 hitting head chest,no rib fracture and CT scan. He progressively became weak, less mobile and started getting short of breath.  DVT/PE is probably related to decreased mobility. -No history of DVT/PE in the past. He apparently was on aspirin and Plavix in the past for TIA. Never on an anticoagulant. -Currently on heparin drip. Continue the same.  Monitor for any worsening of hematuria.  If stable, will switch to oral anticoagulation prior to discharge.  -Required 2 L oxygen by nasal cannula on ambulation.  Chest wall pain/bilateral atelectasis -Patient has been having pain and secondary hypoventilation after a fall on 6/2.  He wants to avoid any form of narcotics and has been relying only on extra strength Tylenol for  pain control.  I would add lidocaine patch, incentive spirometry.  No clinical evidence of pneumonia.  History ofTIA -  -On aspirin and statin  at home. Because of the need to be on anticoagulation and associated high risk of bleeding, I stopped aspirin.  Impaired mobility -Seen by physical therapy.  Recommended SNF.  Patient prefers to go to inpatient rehab.  Does not want to go to to a nursing home.  Case management to follow.  Mobility:Encourage ambulation. PT evaluation ordered. Diet:Regular diet DVT prophylaxis:Heparin drip Code Status:Code Status: Full Code Family Communication:Spoke with patient's wife over the phone Expected Discharge:SNF versus rehab, anticipate to be medically ready in 1 to 2 days.  Consultants:  Urology  Procedures:  None  Antimicrobials:  Anti-infectives (From admission, onward)   None      Infusions:  . sodium chloride 75 mL/hr (10/14/18 1219)  . heparin 1,500 Units/hr (10/14/18 0339)    Scheduled Meds: . atorvastatin  20 mg Oral q1800  . bisacodyl  10 mg Rectal Daily  . calcium-vitamin D  1 tablet Oral BH-q7a  . dutasteride  0.5 mg Oral Daily  . lidocaine  1 patch Transdermal Q24H  . lidocaine-prilocaine   Topical Daily  . omega-3 acid ethyl esters  1 g Oral QODAY  . polyethylene glycol  17 g Oral Daily  . propranolol  10 mg Oral TID  . tamsulosin  0.4 mg Oral QPC supper  . vitamin C with rose hips  500 mg Oral BID    PRN meds: acetaminophen, fluticasone, meclizine, ondansetron **OR** ondansetron (ZOFRAN) IV, phenol, sodium chloride, tiZANidine   Objective: Vitals:   10/14/18 0700 10/14/18 0900  BP:    Pulse: 80 92  Resp: 18 17  Temp:    SpO2: 95% 97%    Intake/Output Summary (Last 24 hours) at 10/14/2018 1243 Last data filed at 10/14/2018 0900 Gross per 24 hour  Intake 1918.81 ml  Output 2001 ml  Net -82.19 ml   Filed Weights   10/11/18 2225  Weight: 112 kg   Weight change:  Body mass index is 30.87 kg/m.   Physical Exam: General exam: Appears calm and comfortable.  Skin: No rashes, lesions or ulcers. HEENT: Atraumatic, normocephalic,  supple neck, no obvious bleeding Lungs: Clear to auscultation bilaterally CVS: Regular rate and rhythm, no murmur GI/Abd soft, nontender, nondistended, bowel sound present CNS: Alert, awake, oriented x3 Psychiatry: Mood appropriate Extremities: No pedal edema, no calf tenderness  Data Review: I have personally reviewed the laboratory data and studies available.  Recent Labs  Lab 10/11/18 2243 10/12/18 0735 10/13/18 0435 10/14/18 0429  WBC 10.4 10.9* 11.2* 10.0  NEUTROABS 6.9  --   --   --   HGB 14.2 13.7 12.2* 12.3*  HCT 43.0 42.5 37.6* 37.7*  MCV 96.8 97.7 96.9 97.2  PLT 372 365 351 342   Recent Labs  Lab 10/11/18 2243 10/12/18 0735 10/14/18 0429  NA 136 136 138  K 4.0 3.8 3.6  CL 102 103 108  CO2 23 21* 23  GLUCOSE 120* 108* 114*  BUN 19 21 11   CREATININE 1.01 0.91 0.81  CALCIUM 9.5 9.3 8.8*    Lorin GlassBinaya Ermon Sagan, MD  [facility] 10/14/2018

## 2018-10-14 NOTE — Care Management Important Message (Signed)
Important Message  Patient Details  Name: KAYEN GRABEL MRN: [id number] Date of Birth: 1940/08/12   Medicare Important Message Given:  Yes     Shelda Altes 10/14/2018, 1:03 PM

## 2018-10-14 NOTE — Progress Notes (Signed)
[place] for Heparin Indication: pulmonary embolus  Allergies  Allergen Reactions  . Aspirin     Had hives due to bee sting while on asprin  . Bee Venom Hives and Swelling  . 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 Lactose Intolerance (Gi) Diarrhea  . Penicillins Rash    Patient Measurements: Height: 6\' 3"  (190.5 cm) Weight: 247 lb (112 kg) IBW/kg (Calculated) : 84.5 Heparin dosing weight = 108 kg  Vital Signs: Temp: 97.8 F (36.6 C) (06/22 0454) Temp Source: Oral (06/22 0454) BP: 98/67 (06/22 0454) Pulse Rate: 82 (06/22 0454)  Labs: Recent Labs    10/11/18 2243  10/12/18 0735  10/12/18 1952 10/13/18 0435 10/14/18 0429  HGB 14.2  --  13.7  --   --  12.2* 12.3*  HCT 43.0  --  42.5  --   --  37.6* 37.7*  PLT 372  --  365  --   --  351 342  HEPARINUNFRC  --    < > 0.96*   < > 0.45 0.51 0.35  CREATININE 1.01  --  0.91  --   --   --  0.81   < > = values in this interval not displayed.    Estimated Creatinine Clearance: 104.8 mL/min (by C-G formula based on SCr of 0.81 mg/dL).   Assessment: 78 year old male originally admitted to [facility] for hematuria found to have PE.  Started on IV heparin prior to transfer.  Heparin level remains therapeutic this AM at 35, on 1500 units/hr. Hgb down to 12.3, plts ok, no s/sx of bleeding per RN.  Goal of Therapy:  Heparin level 0.3-0.7 units/ml Monitor platelets by anticoagulation protocol: Yes   Plan:  Continue heparin infusion at 1500 units/hr Monitor daily heparin level, CBC, s/sx bleeding  Thank you Anette Guarneri, PharmD [phone]  10/14/2018, 8:50 AM

## 2018-10-14 NOTE — Care Management Important Message (Signed)
Important Message  Patient Details  Name: LAWERENCE DERY MRN: [id number] Date of Birth: 1940-12-31   Medicare Important Message Given:  Yes     Shelda Altes 10/14/2018, 1:06 PM

## 2018-10-14 NOTE — Progress Notes (Signed)
Subjective: Patient reports He remains very weak but feeling better.  He is glad to be having a bowel movement.  Objective: Vital signs in last 24 hours: Temp:  [97.6 F (36.4 C)-97.9 F (36.6 C)] 97.8 F (36.6 C) (06/22 0454) Pulse Rate:  [73-94] 92 (06/22 0900) Resp:  [13-19] 17 (06/22 0900) BP: (95-111)/(59-83) 98/67 (06/22 0500) SpO2:  [89 %-97 %] 97 % (06/22 0900)  Intake/Output from previous day: 06/21 0701 - 06/22 0700 In: 1778.8 [P.O.:220; I.V.:1558.8] Out: 800 [Urine:800] Intake/Output this shift: Total I/O In: 360 [P.O.:360] Out: 1201 [Urine:1200; Stool:1]  Physical Exam:  No acute distress Sitting on the edge of the bed Foley in place with clear urine I helped the nurse assistant and the patient stand and transfer to the bedside commode.  Lab Results: Recent Labs    10/12/18 0735 10/13/18 0435 10/14/18 0429  HGB 13.7 12.2* 12.3*  HCT 42.5 37.6* 37.7*   BMET Recent Labs    10/12/18 0735 10/14/18 0429  NA 136 138  K 3.8 3.6  CL 103 108  CO2 21* 23  GLUCOSE 108* 114*  BUN 21 11  CREATININE 0.91 0.81  CALCIUM 9.3 8.8*   No results for input(s): LABPT, INR in the last 72 hours. No results for input(s): LABURIN in the last 72 hours. Results for orders placed or performed during the hospital encounter of 10/11/18  SARS Coronavirus 2     Status: None   Collection Time: 10/11/18 10:38 PM  Result Value Ref Range Status   SARS Coronavirus 2 NOT DETECTED NOT DETECTED Final    Comment: (NOTE) SARS-CoV-2 target nucleic acids are NOT DETECTED. The SARS-CoV-2 RNA is generally detectable in upper and lower respiratory specimens during the acute phase of infection.  Negative  results do not preclude SARS-CoV-2 infection, do not rule out co-infections with other pathogens, and should not be used as the sole basis for treatment or other patient management decisions.  Negative results must be combined with clinical observations, patient history, and  epidemiological information. The expected result is Not Detected. Fact Sheet for Patients: http://www.biofiredefense.com/wp-content/uploads/2020/03/BIOFIRE-COVID -19-patients.pdf Fact Sheet for Healthcare Providers: http://www.biofiredefense.com/wp-content/uploads/2020/03/BIOFIRE-COVID -19-hcp.pdf This test is not yet approved or cleared by the [place] FDA and  has been authorized for detection and/or diagnosis of SARS-CoV-2 by FDA under an Emergency Use Authorization (EUA).  This EUA will remain in effec t (meaning this test can be used) for the duration of  the COVID-19 declaration under Section 564(b)(1) of the Act, 21 U.S.C. section 360bbb-3(b)(1), unless the authorization is terminated or revoked sooner. Performed at [facility], [address] [place], [place] [address]     Studies/Results: Vas Koreas Lower Extremity Venous (dvt)  Result Date: 10/12/2018  Lower Venous Study Indications: Edema, and pulmonary embolism.  Risk Factors: Confirmed PE. Limitations: Edema. Comparison Study: Prior negative study of the LLE 01/06/15 is available Performing Technologist: Sherren Kernsandace Kanady RVS  Examination Guidelines: A complete evaluation includes B-mode imaging, spectral Doppler, color Doppler, and power Doppler as needed of all accessible portions of each vessel. Bilateral testing is considered an integral part of a complete examination. Limited examinations for reoccurring indications may be performed as noted.  +---------+---------------+---------+-----------+----------+----------+ RIGHT    CompressibilityPhasicitySpontaneityPropertiesSummary    +---------+---------------+---------+-----------+----------+----------+ CFV      Full           Yes      Yes                             +---------+---------------+---------+-----------+----------+----------+  SFJ      Full                                                     +---------+---------------+---------+-----------+----------+----------+ FV Prox  Full                                                    +---------+---------------+---------+-----------+----------+----------+ FV Mid   Full                                                    +---------+---------------+---------+-----------+----------+----------+ FV DistalFull                                                    +---------+---------------+---------+-----------+----------+----------+ PFV      Full                                                    +---------+---------------+---------+-----------+----------+----------+ POP                     Yes      Yes                             +---------+---------------+---------+-----------+----------+----------+ PTV                                                   visualized +---------+---------------+---------+-----------+----------+----------+ PERO                                                  visualized +---------+---------------+---------+-----------+----------+----------+   +---------+---------------+---------+-----------+----------+----------+ LEFT     CompressibilityPhasicitySpontaneityPropertiesSummary    +---------+---------------+---------+-----------+----------+----------+ CFV      Full           Yes      Yes                             +---------+---------------+---------+-----------+----------+----------+ SFJ      Full                                                    +---------+---------------+---------+-----------+----------+----------+ FV Prox  Full                                                    +---------+---------------+---------+-----------+----------+----------+  FV Mid   Full                                                    +---------+---------------+---------+-----------+----------+----------+ FV DistalFull                                                     +---------+---------------+---------+-----------+----------+----------+ PFV      Full                                                    +---------+---------------+---------+-----------+----------+----------+ POP      Full                                                    +---------+---------------+---------+-----------+----------+----------+ PTV                                                   visualized +---------+---------------+---------+-----------+----------+----------+ PERO     None                                         Acute      +---------+---------------+---------+-----------+----------+----------+     Summary: Right: There is no evidence of deep vein thrombosis in the lower extremity. However, portions of this examination were limited- see technologist comments above. Left: Findings consistent with acute deep vein thrombosis involving the left peroneal veins.  *See table(s) above for measurements and observations. Electronically signed by Monica Martinez MD on 10/12/2018 at 1:34:25 PM.    Final     Assessment/Plan: Gross hematuria-likely from BPH and/or bladder distention.  Urine is clear.  He had a CT scan abdomen and pelvis September 24, 2018 which showed no upper tract lesions or renal masses.  No stones. I reviewed all the images. He will need to follow up for cystoscopy.  Urinary retention-continue Foley.  Patient is weak and at increased risk of recurrent retention.  I will arrange a follow-up in 7-10 days for voiding trial.  BPH-he should continue dutasteride, but we discussed he may take finasteride in hospital.  Also I went over the nature risks and benefits of an alpha blocker and we will start tamsulosin.  I will sign off, but please page GU with any questions, concerns or change in patient status.    LOS: 3 days   Festus Aloe 10/14/2018, 10:21 AM

## 2018-10-15 LAB — CBC
HCT: 35.5 % — ABNORMAL LOW (ref 39.0–52.0)
Hemoglobin: 11.7 g/dL — ABNORMAL LOW (ref 13.0–17.0)
MCH: 32.1 pg (ref 26.0–34.0)
MCHC: 33 g/dL (ref 30.0–36.0)
MCV: 97.3 fL (ref 80.0–100.0)
Platelets: 348 10*3/uL (ref 150–400)
RBC: 3.65 MIL/uL — ABNORMAL LOW (ref 4.22–5.81)
RDW: 12.7 % (ref 11.5–15.5)
WBC: 10 10*3/uL (ref 4.0–10.5)
nRBC: 0 % (ref 0.0–0.2)

## 2018-10-15 LAB — BASIC METABOLIC PANEL
Anion gap: 7 (ref 5–15)
BUN: 9 mg/dL (ref 8–23)
CO2: 23 mmol/L (ref 22–32)
Calcium: 8.9 mg/dL (ref 8.9–10.3)
Chloride: 109 mmol/L (ref 98–111)
Creatinine, Ser: 0.88 mg/dL (ref 0.61–1.24)
GFR calc Af Amer: >60 mL/min (ref >60)
GFR calc non Af Amer: >60 mL/min (ref >60)
Glucose, Bld: 121 mg/dL — ABNORMAL HIGH (ref 70–99)
Potassium: 3.7 mmol/L (ref 3.5–5.1)
Sodium: 139 mmol/L (ref 135–145)

## 2018-10-15 LAB — HEPARIN LEVEL (UNFRACTIONATED): Heparin Unfractionated: 0.4 IU/mL (ref 0.30–0.70)

## 2018-10-15 MED ORDER — APIXABAN 5 MG PO TABS
10.0000 mg | ORAL_TABLET | Freq: Two times a day (BID) | ORAL | Status: DC
  Administered 2018-10-15 – 2018-10-16 (×2): 10 mg via ORAL
  Filled 2018-10-15 (×2): qty 2

## 2018-10-15 MED ORDER — APIXABAN 5 MG PO TABS: 5.0000 mg | ORAL_TABLET | Freq: Two times a day (BID) | ORAL | Status: DC

## 2018-10-15 NOTE — TOC Initial Note (Addendum)
Transition of Care [facility]) - Initial/Assessment Note    Patient Details  Name: [name] MRN: [id number] Date of Birth: May 26, 1940  Transition [facility]) CM/SW Contact:    [name], [place] Phone Number: 10/15/2018, 4:30 PM  Clinical Narrative:                 CSW visited with the patient at bedside. Patient states he lives at home with his wife. Patient states he is the caregiver for his wife and expressed he wants to get better so he can get back home. Patient states he has two children but requested CSW talk with his friend [name]  [phone] or [phone] for making decisions and assisting with his care. Patient states his support group is 10 of his retired friends, they help to support each other.    Patient expressed he was willing to go to SNF and was considering [facility] but was told they have COVID. Patient really concern about going to SNF and possibly spreading the virus to his wife.  Patient states he will work hard and do whatever was necessary to get better in rehab. Patient requested to be evaluated by inpatient rehab.   Patient was thankful for CSW assistance and expressed hope for CIR placement. Patient states no questions or concerns at this time. CSW will continue to follow and assist with discharge planning.   Thurmond Butts, MSW, [facility] Clinical Social Worker [phone]      Expected Discharge Plan: IP Rehab Facility Barriers to Discharge: Insurance Authorization   Patient Goals and CMS Choice Patient states their goals for this hospitalization and ongoing recovery are:: get better so I can get back home, I take care of my wife      Expected Discharge Plan and Services Expected Discharge Plan: [place]       Living arrangements for the past 2 months: [name]                                      Prior Living Arrangements/Services Living arrangements for the past 2 months: Single Family  Home Lives with:: Self, Spouse Patient language and need for interpreter reviewed:: No(none needed)        Need for Family Participation in Patient Care: No (Comment) Care giver support system in place?: Yes (comment)   Criminal Activity/Legal Involvement Pertinent to Current Situation/Hospitalization: No - Comment as needed  Activities of Daily Living Home Assistive Devices/Equipment: Gilford Rile (specify type) ADL Screening (condition at time of admission) Patient's cognitive ability adequate to safely complete daily activities?: Yes Is the patient deaf or have difficulty hearing?: No Does the patient have difficulty seeing, even when wearing glasses/contacts?: No Does the patient have difficulty concentrating, remembering, or making decisions?: No Patient able to express need for assistance with ADLs?: Yes Does the patient have difficulty dressing or bathing?: No Independently performs ADLs?: Yes (appropriate for developmental age) Does the patient have difficulty walking or climbing stairs?: Yes Weakness of Legs: Both Weakness of Arms/Hands: Both  Permission Sought/Granted Permission sought to share information with : Facility Sport and exercise psychologist, Family Supports Permission granted to share information with : Yes, Verbal Permission Granted  Share Information with NAME: [name]  Permission granted to share info w AGENCY: SNFs  Permission granted to share info w Relationship: Friend  Permission granted to share info w Contact Information: [phone] or [phone]  [phone]  Emotional Assessment Appearance:: Appears stated age Attitude/Demeanor/Rapport: Engaged Affect (typically observed): Accepting, Hopeful Orientation: : Oriented to Self, Oriented to Place, Oriented to  Time, Oriented to Situation Alcohol / Substance Use: Not Applicable Psych Involvement: No (comment)  Admission diagnosis:  Pulmonary embolism (HCC) [I26.99] Patient Active Problem List   Diagnosis Date  Noted  . 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 Hyponatremia 10/11/2018  . Hematuria 10/11/2018  . Hypertension   . Tremors of nervous system   . Vertigo   . Thyroid nodule   . Arrhythmia   . TIA (transient ischemic attack) 03/15/2018  . Onychomycosis 12/05/2016  . Skin ulcer of second toe of right foot, limited to breakdown of skin (HCC) 12/05/2016   PCP:  Koren ShiverMasneri, Shannon M, DO Pharmacy:   [facility] [phone][id number] - [place], [place] - [address] [place] [address] Phone: [phone] Fax: [phone]     Social Determinants of Health (SDOH) Interventions    Readmission Risk Interventions No flowsheet data found.

## 2018-10-15 NOTE — Progress Notes (Addendum)
Inpatient Rehabilitation Admissions Coordinator  Inpatient Rehab Consult received. I met with patient at the bedside for rehabilitation assessment. We discussed goals and expectations of an inpatient rehab admission.  Patient prefers an inpt rehab admit but had been working on admit to [facility] prior to admit due to functional decline at home. He asked me to contact his friend Halina Maidens who is assisting with his home management. I contacted Elta Guadeloupe and will begin insurance authorization with [facility] Medicare for a possible inpt rehab admit pending insurance approval. I will follow up tomorrow.  Danne Baxter, RN, MSN Rehab Admissions Coordinator [phone] 10/15/2018 2:32 PM

## 2018-10-15 NOTE — Evaluation (Signed)
Occupational Therapy Evaluation Patient Details Name: [name] MRN: [id number] DOB: May 20, 1940 Today's Date: 10/15/2018    History of Present Illness [name] is a 78 y.o. male with medical history significant of hypertension, BPH and possible prostate cancer, rest tremors, CVA, severe constipation who went to the ER complaining of hematuria and generalized weakness.  Patient has had decreased mobility and uses a walker to move around.  He has had recent fall with injury to his left ribs.  He has been mildly debilitated although not bedbound.  As part of his work-up patient was found to have bilateral pulmonary embolism. Recovery complicated by urinary retention; Urology following   Clinical Impression   Pt admitted with above. He demonstrates the below listed deficits and will benefit from continued OT to maximize safety and independence with BADLs.  Pt presents to OT with generalized weakness, impaired balance, pain, decreased activity tolerance.  He currently requires min - max A for ADLs and min - mod A for functional transfers.  He lives with his wife, who has cognitive impairment, and to whom he provides supervision.   He does endorse a history of falls.  He will benefit from post acute rehab and would benefit from the intensity of CIR to allow him to maximize his independence in order to return home at mod I - supervision level, as well as reduce risk of falls and reduce risk of readmission.   Will follow acutely.       Follow Up Recommendations  CIR;Supervision/Assistance - 24 hour    Equipment Recommendations  Hospital bed(may benefit from hospital bed )    Recommendations for Other Services Rehab consult     Precautions / Restrictions Precautions Precautions: Fall Precaution Comments: Very sore L side      Mobility Bed Mobility Overal bed mobility: Needs Assistance Bed Mobility: Supine to Sit;Sit to Supine     Supine to sit: Min guard;HOB elevated Sit to  supine: Min guard;HOB elevated   General bed mobility comments: increased time and heavy reliance on bedrails   Transfers Overall transfer level: Needs assistance Equipment used: Rolling walker (2 wheeled) Transfers: Sit to/from [facility] Sit to Stand: Min assist;From elevated surface Stand pivot transfers: Mod assist       General transfer comment: assist to power up into standing and assist for balance     Balance Overall balance assessment: Needs assistance Sitting-balance support: Single extremity supported Sitting balance-Leahy Scale: Fair Sitting balance - Comments: able to maintain static sitting    Standing balance support: Bilateral upper extremity supported Standing balance-Leahy Scale: Poor Standing balance comment: reliant on bil. UE support                            ADL either performed or assessed with clinical judgement   ADL Overall ADL's : Needs assistance/impaired Eating/Feeding: Independent   Grooming: Wash/dry hands;Wash/dry face;Oral care;Brushing hair;Set up;Sitting   Upper Body Bathing: Moderate assistance;Sitting   Lower Body Bathing: Sit to/from stand;Moderate assistance   Upper Body Dressing : Minimal assistance;Sitting   Lower Body Dressing: Maximal assistance Lower Body Dressing Details (indicate cue type and reason): difficulty accessing feet due to fear of falling forward  Toilet Transfer: Moderate assistance;Stand-pivot;BSC   Toileting- Clothing Manipulation and Hygiene: Maximal assistance;Sit to/from stand       Functional mobility during ADLs: Moderate assistance       Vision         Perception  Praxis      Pertinent Vitals/Pain Pain Assessment: Faces Faces Pain Scale: Hurts little more Pain Location: L flank/ribs with movement Pain Descriptors / Indicators: Aching;Grimacing;Guarding Pain Intervention(s): Monitored during session     Hand Dominance Right   Extremity/Trunk Assessment  Upper Extremity Assessment Upper Extremity Assessment: Generalized weakness LUE Deficits / Details: pt with intention tremor    Lower Extremity Assessment Lower Extremity Assessment: Defer to PT evaluation   Cervical / Trunk Assessment Cervical / Trunk Assessment: Other exceptions Cervical / Trunk Exceptions: limited isolated movement due to rib pain    Communication Communication Communication: No difficulties   Cognition Arousal/Alertness: Awake/alert Behavior During Therapy: WFL for tasks assessed/performed Overall Cognitive Status: Within Functional Limits for tasks assessed                                 General Comments: Pt is very verbose and requires frequent redirection back to task.  He scored 14 on the BIMs which indicates normal cognition    General Comments  Pt very fixated on the need to find a craftmatic bed.  Explained the differences between adjustable beds and hospital beds     Exercises     Shoulder Instructions      Home Living Family/patient expects to be discharged to:: Private residence Living Arrangements: Spouse/significant other Available Help at Discharge: Family;Available 24 hours/day Type of Home: House Home Access: Stairs to enter Entergy CorporationEntrance Stairs-Number of Steps: 2 Entrance Stairs-Rails: None Home Layout: Two level Alternate Level Stairs-Number of Steps: chair lift to basement   Bathroom Shower/Tub: Tub/shower unit;Walk-in shower   Bathroom Toilet: Standard     Home Equipment: Environmental consultantWalker - 4 wheels;Cane - single point;Shower seat   Additional Comments: Per pt. 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   SHe is able to physically care for herself, but requires supervision, and assist for IADLs       Prior Functioning/Environment Level of Independence: Independent with assistive device(s)        Comments: Pt reports h/o frequent falls.  pt uses a cane "most of the time" and a RW if  ambulating long distances; tells me he needs a new RW; hasn't walked much since his fall resulting in L sided flank pain        OT Problem List: Decreased strength;Decreased activity tolerance;Impaired balance (sitting and/or standing);Decreased safety awareness;Decreased knowledge of use of DME or AE;Pain      OT Treatment/Interventions: Self-care/ADL training;Therapeutic exercise;DME and/or AE instruction;Therapeutic activities;Patient/family education;Balance training    OT Goals(Current goals can be found in the care plan section) Acute Rehab OT Goals Patient Stated Goal: to be able to regain independence  OT Goal Formulation: With patient Time For Goal Achievement: 10/29/18 Potential to Achieve Goals: Good ADL Goals Pt Will Perform Grooming: with min guard assist;standing Pt Will Perform Lower Body Bathing: with min guard assist;sit to/from stand;with adaptive equipment Pt Will Perform Lower Body Dressing: with min guard assist;with adaptive equipment;sit to/from stand Pt Will Transfer to Toilet: with min guard assist;ambulating;regular height toilet;bedside commode;grab bars Pt Will Perform Toileting - Clothing Manipulation and hygiene: with min guard assist;sit to/from stand  OT Frequency: Min 2X/week   Barriers to D/C: Decreased caregiver support  wife unable to assist her        Co-evaluation              AM-PAC OT "6  Clicks" Daily Activity     Outcome Measure Help from another person eating meals?: None Help from another person taking care of personal grooming?: A Little Help from another person toileting, which includes using toliet, bedpan, or urinal?: A Lot Help from another person bathing (including washing, rinsing, drying)?: A Lot Help from another person to put on and taking off regular upper body clothing?: A Little Help from another person to put on and taking off regular lower body clothing?: A Lot 6 Click Score: 16   End of Session Equipment Utilized  During Treatment: Gait belt;Rolling walker Nurse Communication: Mobility status  Activity Tolerance: Patient tolerated treatment well Patient left: in bed;with call bell/phone within reach;with bed alarm set  OT Visit Diagnosis: Unsteadiness on feet (R26.81);Pain Pain - Right/Left: Right Pain - part of body: Knee                Time: 1535-1610 OT Time Calculation (min): 35 min Charges:  OT General Charges $OT Visit: 1 Visit OT Evaluation $OT Eval Moderate Complexity: 1 Mod OT Treatments $Therapeutic Activity: 8-22 mins  Jeani HawkingWendi Neeya Prigmore, OTR/L Acute Rehabilitation Services Pager [phone] Office [phone]   Jeani HawkingConarpe, Ladean Steinmeyer M 10/15/2018, 4:58 PM

## 2018-10-15 NOTE — Progress Notes (Signed)
PROGRESS NOTE  [name] ION:[id number] DOB: [birth year] DOA: 10/11/2018 PCP: Lyman Bishop, DO   LOS: 4 days   Brief narrative: Patientis a 78 y.o.malewith medical history significant ofhypertension, BPHs/p TURP,restingtremors, CVA, severe constipation lives at home with his wife. History was obtained from patient as well as his wife over the phone. On 6/2, patient trippedand fellwhile walking on the yard andhit the left side of his body on the side rail. He went to the ED. Had a CT scan of chest abdomen pelvis which did not show any traumatic injury. He has been taking extra strength Tylenol at home with inadequate pain control. He has decreased mobility and progressive generalized weakness since then but not bedbound.  On 6/18, he had difficulty urinating and also passed some blood.  [facility].  I reviewed the CT scan of abdomen and pelvis as well as CT angiogram of the chest obtained at [facility]. CT chest abdomen pelvis was obtained which showed irregular nodular thickening of the prostate causing mass-effect on the bladder base. Floating material/debris within the bladder compatible with blood products and/or infectious debris's. No obstructing ureteral calculi or hydronephrosis.  CT Angioof chest was positive for bilateral lower lobe segmental pulmonary embolism associated with atelectasis.  He was started on heparin drip and transferred to [place].  On arrival to our hospital, patient was hemodynamically stable. Initial blood work with WBC count slightly elevated to 11,000. COVID-19 test negative. Bladder scan noted more than 1800 urine. Foley catheter was placed, 2 L of urine drained,also noted bloody urine towards the end. Ultrasound duplex of lower extremities showed left peroneal vein DVT.  Subjective: Patient was seen and examined this morning.  Pleasant elderly male.  Sitting up in chair.  Not in  distress.  Feels much better.  Anticipating to get accepted at [facility].  Assessment/Plan:  Principal Problem:   Pulmonary embolism and infarction [facility]) Active Problems:   Hypertension   Hyponatremia   Hematuria  Hematuria/urinary retention -Bladder scan noted more than 1800 urine. Foley catheter was placed, 2 L of urine drained,also noted bloody urine towards the end. -History of BPH s/p TURP.On Avodart.  -No evidence ofUTI. -Patient also complaining of bladder spasms which have improved after decompression and bowel movement. Urologyconsult obtained. Recommendation is to maintain Foley catheter at discharge and follow-up with Dr.Eskridge forvoiding trial in 1 week. -Per urologist, gross hematuria is likely related to rapiddecompression of his bladder. Continue to monitor for bleeding/clots.  Added Flomax.  Bilateral lower lobe pulmonary embolism -CT Angioof chest was positive for bilateral lower lobe segmental pulmonary embolism associated with atelectasis. -Ultrasound duplex lower extremities showed left peroneal vein DVT. -Patient had a fall on 6/2 hitting head chest,no rib fracture and CT scan. He progressively became weak, less mobile and started getting short of breath.DVT/PE is probably related to decreased mobility. -No history of DVT/PE in the past. He apparently was on aspirin and Plavix in the past for TIA. Never on an anticoagulant. -Currently on heparin drip. No worsening of bleeding on heparin drip.  Switch to Eliquis tonight.   -Requiring 2 L oxygen by nasal cannula.  Gradually wean down. -Stop IV fluid today.  Chest wall pain/bilateral atelectasis -Patient has been having pain and secondary hypoventilation after a fall on 6/2. He wants to avoid any form of narcotics and has been relying only on extra strength Tylenol for pain control. Added lidocaine patch, incentive spirometry. No clinical evidence of pneumonia.  History ofTIA -  -  On aspirin  and statin at home.Because of the need to be on anticoagulation and associated high risk of bleeding, I stopped aspirin.  Impaired mobility -Seen by physical therapy.  Recommended SNF.  Patient prefers to go to inpatient rehab.   Rehab consult placed.  Mobility:Encourage ambulation. PT evaluation ordered. Diet:Regular diet DVT prophylaxis:Heparin drip Code Status:Code Status: Full Code Family Communication:Spoke with patient's wife over the phone Expected Discharge:SNF versus rehab,  medically stable for discharge  Consultants:  Urology  Antimicrobials:  Anti-infectives (From admission, onward)   None      Infusions:  . heparin 1,500 Units/hr (10/15/18 0650)    Scheduled Meds: . apixaban  10 mg Oral BID   Followed by  . [START ON 10/22/2018] apixaban  5 mg Oral BID  . atorvastatin  20 mg Oral q1800  . bisacodyl  10 mg Rectal Daily  . calcium-vitamin D  1 tablet Oral BH-q7a  . dutasteride  0.5 mg Oral Daily  . lidocaine  1 patch Transdermal Q24H  . lidocaine-prilocaine   Topical Daily  . omega-3 acid ethyl esters  1 g Oral QODAY  . polyethylene glycol  17 g Oral Daily  . propranolol  10 mg Oral TID  . tamsulosin  0.4 mg Oral QPC supper  . vitamin C with rose hips  500 mg Oral BID    PRN meds: acetaminophen, fluticasone, meclizine, ondansetron **OR** ondansetron (ZOFRAN) IV, phenol, sodium chloride, tiZANidine   Objective: Vitals:   10/15/18 0553 10/15/18 1015  BP: 98/76 106/71  Pulse: 91 92  Resp: 15 15  Temp: 98.2 F (36.8 C) 98 F (36.7 C)  SpO2: 98% 94%    Intake/Output Summary (Last 24 hours) at 10/15/2018 1409 Last data filed at 10/15/2018 0650 Gross per 24 hour  Intake 1080 ml  Output 2126 ml  Net -1046 ml   Filed Weights   10/11/18 2225  Weight: 112 kg   Weight change:  Body mass index is 30.87 kg/m.   Physical Exam: General exam: Appears calm and comfortable.  Skin: No rashes, lesions or ulcers. HEENT: Atraumatic,  normocephalic, supple neck, no obvious bleeding Lungs: Clear to auscultation bilaterally CVS: Regular rate and rhythm, no murmur GI/Abd soft, nontender, nondistended, bowel sound present CNS: Alert, awake, oriented x3 Psychiatry: Mood appropriate Extremities: No pedal edema, no calf tenderness  Data Review: I have personally reviewed the laboratory data and studies available.  Recent Labs  Lab 10/11/18 2243 10/12/18 0735 10/13/18 0435 10/14/18 0429 10/15/18 0414  WBC 10.4 10.9* 11.2* 10.0 10.0  NEUTROABS 6.9  --   --   --   --   HGB 14.2 13.7 12.2* 12.3* 11.7*  HCT 43.0 42.5 37.6* 37.7* 35.5*  MCV 96.8 97.7 96.9 97.2 97.3  PLT 372 365 351 342 348   Recent Labs  Lab 10/11/18 2243 10/12/18 0735 10/14/18 0429 10/15/18 0414  NA 136 136 138 139  K 4.0 3.8 3.6 3.7  CL 102 103 108 109  CO2 23 21* 23 23  GLUCOSE 120* 108* 114* 121*  BUN 19 21 11 9   CREATININE 1.01 0.91 0.81 0.88  CALCIUM 9.5 9.3 8.8* 8.9    Tanner GlassBinaya Mikiah Demond, MD  [facility] 10/15/2018

## 2018-10-15 NOTE — Progress Notes (Signed)
Physical Therapy Treatment Patient Details Name: [name] MRN: [id number] DOB: [birth year] Today's Date: 10/15/2018    History of Present Illness [name] is a 78 y.o. male with medical history significant of hypertension, BPH and possible prostate cancer, rest tremors, CVA, severe constipation who went to the ER complaining of hematuria and generalized weakness.  Patient has had decreased mobility and uses a walker to move around.  He has had recent fall with injury to his left ribs.  He has been mildly debilitated although not bedbound.  As part of his work-up patient was found to have bilateral pulmonary embolism. Recovery complicated by urinary retention; Urology following    PT Comments    Pt did 3 sets of standing from tall bed and marching in place (only 6 steps each time).  Pt relies heavily on UEs and RW for support.  Pt limited by pain in knees and left ribcage.  Pt will benefit from intensive therapy to help him get his mobilty back to his baseline   Follow Up Recommendations  SNF;Supervision/Assistance - 24 hour;[facility]     Equipment Recommendations  Rolling walker with 5" wheels;3in1 (PT)    Recommendations for Other Services       Precautions / Restrictions Precautions Precautions: Fall Precaution Comments: Very sore L side Restrictions Weight Bearing Restrictions: No    Mobility  Bed Mobility Overal bed mobility: Needs Assistance       Supine to sit: Min assist;HOB elevated     General bed mobility comments: pt given min assist to get weight up into sitting.  Pt able to scoot forward today to get feet closer to floor  Transfers Overall transfer level: Needs assistance Equipment used: Rolling walker (2 wheeled) Transfers: Sit to/from Stand Sit to Stand: Min assist         General transfer comment: Min assist mostly to steady RW; stood from elevated bed; cues for hand placement and safety.  pt stood with very flexed posture - cues to stand more  upright and to squeeze gluts  Ambulation/Gait Ambulation/Gait assistance: Min assist Gait Distance (Feet): 2 Feet Assistive device: Rolling walker (2 wheeled) Gait Pattern/deviations: Shuffle;Trunk flexed     General Gait Details: Pt stood from bed and marched in place (approx 6 steps each time) before legs tired and needing to sit down.  Pt needed cues to stand upright.  pt relied heavily on UEs for staying standing.  Pt did pregait activities - weight shifting etc.   Stairs             Wheelchair Mobility    Modified Rankin (Stroke Patients Only)       Balance                                            Cognition Arousal/Alertness: Awake/alert Behavior During Therapy: WFL for tasks assessed/performed(talkative) Overall Cognitive Status: Within Functional Limits for tasks assessed                                        Exercises Total Joint Exercises Long Arc Quad: Both;10 reps;Seated Other Exercises Other Exercises: standing glut squeezes to help get into upright posture    General Comments General comments (skin integrity, edema, etc.): pt with good sitting balance.  hard to stand  erect as has flexed knees - relies heavily on UEs.  pt reminded to deep breath - O2 sats 86-94% thoughtout session      Pertinent Vitals/Pain Pain Assessment: 0-10 Pain Score: 4  Pain Location: L flank/ribs with movement Pain Descriptors / Indicators: Aching;Grimacing;Guarding Pain Intervention(s): Monitored during session;Repositioned    Home Living                      Prior Function            PT Goals (current goals can now be found in the care plan section) Progress towards PT goals: Progressing toward goals    Frequency    Min 3X/week      PT Plan Current plan remains appropriate    Co-evaluation              AM-PAC PT "6 Clicks" Mobility   Outcome Measure  Help needed turning from your back to your side  while in a flat bed without using bedrails?: A Little Help needed moving from lying on your back to sitting on the side of a flat bed without using bedrails?: A Little Help needed moving to and from a bed to a chair (including a wheelchair)?: A Little Help needed standing up from a chair using your arms (e.g., wheelchair or bedside chair)?: A Lot Help needed to walk in hospital room?: A Lot Help needed climbing 3-5 steps with a railing? : Total 6 Click Score: 14    End of Session Equipment Utilized During Treatment: Gait belt Activity Tolerance: Patient limited by pain;Patient limited by fatigue;Patient tolerated treatment well Patient left: in chair;with call bell/phone within reach Nurse Communication: Mobility status;Need for lift equipment PT Visit Diagnosis: Unsteadiness on feet (R26.81);Other abnormalities of gait and mobility (R26.89);Muscle weakness (generalized) (M62.81);History of falling (Z91.81);Pain Pain - Right/Left: Left     Time: 6045-4098 PT Time Calculation (min) (ACUTE ONLY): 25 min  Charges:  $Therapeutic Activity: 23-37 mins                     10/15/2018   Rande Lawman, PT    Loyal Buba 10/15/2018, 10:22 AM

## 2018-10-15 NOTE — Progress Notes (Signed)
[place] for Heparin Indication: pulmonary embolus  Allergies  Allergen Reactions  . Aspirin     Had hives due to bee sting while on asprin  . Bee Venom Hives and Swelling  . 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 Lactose Intolerance (Gi) Diarrhea  . Penicillins Rash    Patient Measurements: Height: 6\' 3"  (190.5 cm) Weight: 247 lb (112 kg) IBW/kg (Calculated) : 84.5 Heparin dosing weight = 108 kg  Vital Signs: Temp: 98.2 F (36.8 C) (06/23 0553) Temp Source: Oral (06/23 0553) BP: 98/76 (06/23 0553) Pulse Rate: 91 (06/23 0553)  Labs: Recent Labs    10/13/18 0435 10/14/18 0429 10/15/18 0414  HGB 12.2* 12.3* 11.7*  HCT 37.6* 37.7* 35.5*  PLT 351 342 348  HEPARINUNFRC 0.51 0.35 0.40  CREATININE  --  0.81 0.88    Estimated Creatinine Clearance: 96.5 mL/min (by C-G formula based on SCr of 0.88 mg/dL).   Assessment: 78 year old male originally admitted to [facility] for hematuria found to have PE.  Started on IV heparin prior to transfer.  Heparin level remains therapeutic this AM, on 1500 units/hr. Hgb 11.7, plts ok  Goal of Therapy:  Heparin level 0.3-0.7 units/ml Monitor platelets by anticoagulation protocol: Yes   Plan:  Continue heparin infusion at 1500 units/hr Monitor daily heparin level, CBC, s/sx bleeding  Thank you Anette Guarneri, PharmD [phone]  10/15/2018, 9:19 AM

## 2018-10-16 ENCOUNTER — Inpatient Hospital Stay (HOSPITAL_COMMUNITY)
Admission: RE | Admit: 2018-10-16 | Discharge: 2018-11-02 | DRG: 945 | Disposition: A | Discharge status: Home Health | Payer: Medicare Other | Source: Intra-hospital | Attending: Physical Medicine & Rehabilitation | Admitting: Physical Medicine & Rehabilitation

## 2018-10-16 ENCOUNTER — Encounter (HOSPITAL_COMMUNITY): Payer: Self-pay

## 2018-10-16 ENCOUNTER — Other Ambulatory Visit: Payer: Self-pay

## 2018-10-16 DIAGNOSIS — Z8249 Family history of ischemic heart disease and other diseases of the circulatory system: Secondary | ICD-10-CM

## 2018-10-16 DIAGNOSIS — I82452 Acute embolism and thrombosis of left peroneal vein: Secondary | ICD-10-CM | POA: Diagnosis present

## 2018-10-16 DIAGNOSIS — Z8673 Personal history of transient ischemic attack (TIA), and cerebral infarction without residual deficits: Secondary | ICD-10-CM

## 2018-10-16 DIAGNOSIS — R740 Nonspecific elevation of levels of transaminase and lactic acid dehydrogenase [LDH]: Secondary | ICD-10-CM | POA: Diagnosis present

## 2018-10-16 DIAGNOSIS — R296 Repeated falls: Secondary | ICD-10-CM | POA: Diagnosis present

## 2018-10-16 DIAGNOSIS — K5901 Slow transit constipation: Secondary | ICD-10-CM | POA: Diagnosis not present

## 2018-10-16 DIAGNOSIS — Z7982 Long term (current) use of aspirin: Secondary | ICD-10-CM | POA: Diagnosis not present

## 2018-10-16 DIAGNOSIS — Z91030 Bee allergy status: Secondary | ICD-10-CM | POA: Diagnosis not present

## 2018-10-16 DIAGNOSIS — Z79899 Other long term (current) drug therapy: Secondary | ICD-10-CM

## 2018-10-16 DIAGNOSIS — R251 Tremor, unspecified: Secondary | ICD-10-CM | POA: Diagnosis present

## 2018-10-16 DIAGNOSIS — M25561 Pain in right knee: Secondary | ICD-10-CM

## 2018-10-16 DIAGNOSIS — R310 Gross hematuria: Secondary | ICD-10-CM | POA: Diagnosis present

## 2018-10-16 DIAGNOSIS — I10 Essential (primary) hypertension: Secondary | ICD-10-CM | POA: Diagnosis present

## 2018-10-16 DIAGNOSIS — Z7951 Long term (current) use of inhaled steroids: Secondary | ICD-10-CM | POA: Diagnosis not present

## 2018-10-16 DIAGNOSIS — N400 Enlarged prostate without lower urinary tract symptoms: Secondary | ICD-10-CM | POA: Diagnosis present

## 2018-10-16 DIAGNOSIS — D62 Acute posthemorrhagic anemia: Secondary | ICD-10-CM | POA: Diagnosis not present

## 2018-10-16 DIAGNOSIS — R5381 Other malaise: Principal | ICD-10-CM | POA: Diagnosis present

## 2018-10-16 DIAGNOSIS — I2699 Other pulmonary embolism without acute cor pulmonale: Secondary | ICD-10-CM | POA: Diagnosis present

## 2018-10-16 DIAGNOSIS — N401 Enlarged prostate with lower urinary tract symptoms: Secondary | ICD-10-CM | POA: Diagnosis present

## 2018-10-16 DIAGNOSIS — Z20828 Contact with and (suspected) exposure to other viral communicable diseases: Secondary | ICD-10-CM | POA: Diagnosis present

## 2018-10-16 DIAGNOSIS — Z9079 Acquired absence of other genital organ(s): Secondary | ICD-10-CM

## 2018-10-16 DIAGNOSIS — Z880 Allergy status to penicillin: Secondary | ICD-10-CM | POA: Diagnosis not present

## 2018-10-16 DIAGNOSIS — M170 Bilateral primary osteoarthritis of knee: Secondary | ICD-10-CM | POA: Diagnosis not present

## 2018-10-16 DIAGNOSIS — R259 Unspecified abnormal involuntary movements: Secondary | ICD-10-CM

## 2018-10-16 DIAGNOSIS — K5900 Constipation, unspecified: Secondary | ICD-10-CM | POA: Diagnosis present

## 2018-10-16 DIAGNOSIS — Z886 Allergy status to analgesic agent status: Secondary | ICD-10-CM | POA: Diagnosis not present

## 2018-10-16 DIAGNOSIS — R338 Other retention of urine: Secondary | ICD-10-CM

## 2018-10-16 DIAGNOSIS — R319 Hematuria, unspecified: Secondary | ICD-10-CM

## 2018-10-16 DIAGNOSIS — R601 Generalized edema: Secondary | ICD-10-CM

## 2018-10-16 DIAGNOSIS — Z91011 Allergy to milk products: Secondary | ICD-10-CM | POA: Diagnosis not present

## 2018-10-16 DIAGNOSIS — G252 Other specified forms of tremor: Secondary | ICD-10-CM

## 2018-10-16 DIAGNOSIS — R7401 Elevation of levels of liver transaminase levels: Secondary | ICD-10-CM

## 2018-10-16 LAB — BASIC METABOLIC PANEL
Anion gap: 10 (ref 5–15)
BUN: 12 mg/dL (ref 8–23)
CO2: 21 mmol/L — ABNORMAL LOW (ref 22–32)
Calcium: 9 mg/dL (ref 8.9–10.3)
Chloride: 109 mmol/L (ref 98–111)
Creatinine, Ser: 0.92 mg/dL (ref 0.61–1.24)
GFR calc Af Amer: >60 mL/min (ref >60)
GFR calc non Af Amer: >60 mL/min (ref >60)
Glucose, Bld: 119 mg/dL — ABNORMAL HIGH (ref 70–99)
Potassium: 3.8 mmol/L (ref 3.5–5.1)
Sodium: 140 mmol/L (ref 135–145)

## 2018-10-16 LAB — CBC
HCT: 36.1 % — ABNORMAL LOW (ref 39.0–52.0)
Hemoglobin: 11.6 g/dL — ABNORMAL LOW (ref 13.0–17.0)
MCH: 31.6 pg (ref 26.0–34.0)
MCHC: 32.1 g/dL (ref 30.0–36.0)
MCV: 98.4 fL (ref 80.0–100.0)
Platelets: 337 10*3/uL (ref 150–400)
RBC: 3.67 MIL/uL — ABNORMAL LOW (ref 4.22–5.81)
RDW: 12.9 % (ref 11.5–15.5)
WBC: 8.5 10*3/uL (ref 4.0–10.5)
nRBC: 0 % (ref 0.0–0.2)

## 2018-10-16 MED ORDER — TAMSULOSIN HCL 0.4 MG PO CAPS: 0.4000 mg | ORAL_CAPSULE | Freq: Every day | ORAL | Status: DC

## 2018-10-16 MED ORDER — DUTASTERIDE 0.5 MG PO CAPS
0.5000 mg | ORAL_CAPSULE | Freq: Every day | ORAL | Status: DC
  Administered 2018-10-17 – 2018-11-02 (×17): 0.5 mg via ORAL
  Filled 2018-10-16 (×17): qty 1

## 2018-10-16 MED ORDER — TIZANIDINE HCL 2 MG PO TABS
2.0000 mg | ORAL_TABLET | Freq: Three times a day (TID) | ORAL | Status: DC | PRN
  Administered 2018-10-17 – 2018-11-01 (×20): 2 mg via ORAL
  Filled 2018-10-16 (×22): qty 1

## 2018-10-16 MED ORDER — ONDANSETRON HCL 4 MG PO TABS: 4.0000 mg | ORAL_TABLET | Freq: Four times a day (QID) | ORAL | Status: DC | PRN

## 2018-10-16 MED ORDER — CALCIUM CARBONATE-VITAMIN D 500-200 MG-UNIT PO TABS
1.0000 | ORAL_TABLET | Freq: Every day | ORAL | Status: DC
  Administered 2018-10-17 – 2018-11-02 (×17): 1 via ORAL
  Filled 2018-10-16 (×17): qty 1

## 2018-10-16 MED ORDER — SORBITOL 70 % SOLN
30.0000 mL | Freq: Every day | Status: DC | PRN
  Administered 2018-10-17: 30 mL via ORAL
  Filled 2018-10-16: qty 30

## 2018-10-16 MED ORDER — FLUTICASONE PROPIONATE 50 MCG/ACT NA SUSP
1.0000 | Freq: Every day | NASAL | Status: DC | PRN
  Filled 2018-10-16: qty 16

## 2018-10-16 MED ORDER — MECLIZINE HCL 25 MG PO TABS: 25.0000 mg | ORAL_TABLET | Freq: Three times a day (TID) | ORAL | Status: DC | PRN

## 2018-10-16 MED ORDER — LIDOCAINE 5 % EX PTCH: 1.0000 | MEDICATED_PATCH | CUTANEOUS | 0 refills | Status: DC

## 2018-10-16 MED ORDER — APIXABAN 5 MG PO TABS: 10.0000 mg | ORAL_TABLET | Freq: Two times a day (BID) | ORAL | Status: DC

## 2018-10-16 MED ORDER — PROPRANOLOL HCL 10 MG PO TABS
10.0000 mg | ORAL_TABLET | Freq: Three times a day (TID) | ORAL | Status: DC
  Administered 2018-10-16 – 2018-11-02 (×49): 10 mg via ORAL
  Filled 2018-10-16 (×49): qty 1

## 2018-10-16 MED ORDER — LIDOCAINE 5 % EX PTCH
1.0000 | MEDICATED_PATCH | CUTANEOUS | Status: DC
  Administered 2018-10-17 – 2018-11-02 (×17): 1 via TRANSDERMAL
  Filled 2018-10-16 (×17): qty 1

## 2018-10-16 MED ORDER — VITAMIN C 500 MG PO TABS
500.0000 mg | ORAL_TABLET | Freq: Two times a day (BID) | ORAL | Status: DC
  Administered 2018-10-16 – 2018-11-02 (×34): 500 mg via ORAL
  Filled 2018-10-16 (×35): qty 1

## 2018-10-16 MED ORDER — SALINE SPRAY 0.65 % NA SOLN
1.0000 | NASAL | Status: DC | PRN
  Filled 2018-10-16: qty 44

## 2018-10-16 MED ORDER — APIXABAN 5 MG PO TABS
5.0000 mg | ORAL_TABLET | Freq: Two times a day (BID) | ORAL | Status: DC
  Administered 2018-10-22 – 2018-11-02 (×22): 5 mg via ORAL
  Filled 2018-10-16 (×22): qty 1

## 2018-10-16 MED ORDER — ATORVASTATIN CALCIUM 10 MG PO TABS
20.0000 mg | ORAL_TABLET | Freq: Every day | ORAL | Status: DC
  Administered 2018-10-16 – 2018-11-01 (×17): 20 mg via ORAL
  Filled 2018-10-16 (×18): qty 2

## 2018-10-16 MED ORDER — APIXABAN 5 MG PO TABS: 5.0000 mg | ORAL_TABLET | Freq: Two times a day (BID) | ORAL | Status: DC

## 2018-10-16 MED ORDER — ONDANSETRON HCL 4 MG/2ML IJ SOLN: 4.0000 mg | Freq: Four times a day (QID) | INTRAMUSCULAR | Status: DC | PRN

## 2018-10-16 MED ORDER — APIXABAN 5 MG PO TABS
10.0000 mg | ORAL_TABLET | Freq: Two times a day (BID) | ORAL | Status: AC
  Administered 2018-10-16 – 2018-10-22 (×12): 10 mg via ORAL
  Filled 2018-10-16 (×12): qty 2

## 2018-10-16 MED ORDER — POLYETHYLENE GLYCOL 3350 17 G PO PACK
17.0000 g | PACK | Freq: Every day | ORAL | Status: DC
  Administered 2018-10-17 – 2018-11-01 (×14): 17 g via ORAL
  Filled 2018-10-16 (×15): qty 1

## 2018-10-16 MED ORDER — ACETAMINOPHEN 325 MG PO TABS
650.0000 mg | ORAL_TABLET | Freq: Four times a day (QID) | ORAL | Status: DC | PRN
  Administered 2018-10-16 – 2018-11-01 (×21): 650 mg via ORAL
  Filled 2018-10-16 (×24): qty 2

## 2018-10-16 MED ORDER — OMEGA-3-ACID ETHYL ESTERS 1 G PO CAPS
1.0000 g | ORAL_CAPSULE | ORAL | Status: DC
  Administered 2018-10-18 – 2018-11-01 (×8): 1 g via ORAL
  Filled 2018-10-16 (×8): qty 1

## 2018-10-16 MED ORDER — BISACODYL 10 MG RE SUPP
10.0000 mg | Freq: Every day | RECTAL | Status: DC
  Administered 2018-10-17 – 2018-10-29 (×6): 10 mg via RECTAL
  Filled 2018-10-16 (×9): qty 1

## 2018-10-16 MED ORDER — TAMSULOSIN HCL 0.4 MG PO CAPS
0.4000 mg | ORAL_CAPSULE | Freq: Every day | ORAL | Status: DC
  Administered 2018-10-16 – 2018-11-01 (×17): 0.4 mg via ORAL
  Filled 2018-10-16 (×17): qty 1
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
## 2018-10-16 NOTE — Progress Notes (Signed)
Jamse Arn, MD  Physician  Physical Medicine and Rehabilitation  PMR Pre-admission  Addendum  Date of Service:  10/16/2018 1:46 PM      Related encounter: Admission (Discharged) from 10/11/2018 in [facility] 4E CV SURGICAL PROGRESSIVE CARE         Show:Clear all '[x]'$ Manual'[x]'$ Template'[x]'$ Copied  Added by: '[x]'$ Cristina Gong, RN'[x]'$ Jamse Arn, MD  '[]'$ Hover for details PMR Admission Coordinator Pre-Admission Assessment  Patient: [name] is an 78 y.o., male MRN: [id number] DOB: [birth year] Height: '6\' 3"'$  (190.5 cm) Weight: 112 kg  Insurance Information HMO:     PPO: yes     PCP:      IPA:      80/20:      OTHER: medicare advantage  PRIMARY: [employer] medicare      Policy#: [id number]      Subscriber: pt CM Name: Tillie Rung      Fax#: [phone] Pre-Cert#: [id number]  F/u Dorthula Nettles in 7 days    Employer:  Benefits:  Phone #: [phone]     Name:  Eff. Date: 04/24/2018     Deduct: none      Out of Pocket Max: $4000      Life Max: none CIR: $160 co pay per day days 1 until 10      SNF: no co pay days 1 until 20; $50 co pay per day days 21 until 100 Outpatient: $20 co pay per visit     Co-Pay: visits per medical necessity Home Health: 100%      Co-Pay: visits per medical neccesity DME: 80%     Co-Pay: 20% Providers: in network  SECONDARY: none       Medicaid Application Date:       Case Manager:  Disability Application Date:       Case Worker:   The "Data Collection Information Summary" for patients in Inpatient Rehabilitation Facilities with attached "[place] Records" was provided and verbally reviewed with: Patient  Emergency Contact Information         Contact Information    Name Relation Home Work Peck [phone]  [phone]      Current Medical History  Patient Admitting Diagnosis: debility  History of Present Illness:78 year old right-handed male with history of hypertension, BPHstatus post  TURP, resting tremors, 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 [facility] bouts of hematuria, constipationand generalized weakness as well as shortness of breath. Recent fall noted per family and debilitatedwith CT of the chest 09/24/2018 showing no definitive evidence of traumatic injury. As part of initial work-up found to have bilateral pulmonary embolisms. He was transferred to [facility] for further evaluation.Review of CT Angioof chest showed bilateral lower lobe segmental pulmonary embolism associated with atelectasis. He was placed on heparin drip. COVID negative. Bladder scan noted more than 1800 cc of urine. Ultrasound duplex of lower extremity showed left peroneal vein DVT. Urology consulted Dr. Alyson Ingles in regards to hematuria felt to be related to rapid decompression of his bladder. Patient was placed on Flomax and would follow-up with urology services versus voiding trial while in the hospital. Patient was transitioned from heparin to Eliquis. Close monitoring of any bleeding episodes. Latest hemoglobin 11.6 and stable.    Patient's medical record from [facility] has been reviewed by the rehabilitation admission coordinator and physician.  Past Medical History      Past Medical History:  Diagnosis Date  . Arrhythmia   . Arthritis   .  Hypertension   . Stroke [facility])    no deficits  . Thyroid nodule   . Tremors of nervous system   . Vertigo     Family History   family history includes Hypertension in his father and mother.  Prior Rehab/Hospitalizations Has the patient had prior rehab or hospitalizations prior to admission? Yes  Has the patient had major surgery during 100 days prior to admission? No              Current Medications  Current Facility-Administered Medications:  .  acetaminophen (TYLENOL) tablet 650 mg, 650 mg, Oral, Q6H PRN, Elwyn Reach, MD, 650 mg at 10/13/18 2022 .  apixaban  (ELIQUIS) tablet 10 mg, 10 mg, Oral, BID, 10 mg at 10/16/18 1045 **FOLLOWED BY** [START ON 10/22/2018] apixaban (ELIQUIS) tablet 5 mg, 5 mg, Oral, BID, Dahal, Binaya, MD .  atorvastatin (LIPITOR) tablet 20 mg, 20 mg, Oral, q1800, Gala Romney L, MD, 20 mg at 10/15/18 1732 .  bisacodyl (DULCOLAX) suppository 10 mg, 10 mg, Rectal, Daily, Dahal, Binaya, MD, 10 mg at 10/14/18 0933 .  calcium-vitamin D (OSCAL WITH D) 500-200 MG-UNIT per tablet 1 tablet, 1 tablet, Oral, BH-q7a, Elwyn Reach, MD, 1 tablet at 10/16/18 0839 .  dutasteride (AVODART) capsule 0.5 mg, 0.5 mg, Oral, Daily, Jonelle Sidle, Mohammad L, MD, 0.5 mg at 10/16/18 1047 .  fluticasone (FLONASE) 50 MCG/ACT nasal spray 1 spray, 1 spray, Each Nare, Daily PRN, Garba, Mohammad L, MD .  lidocaine (LIDODERM) 5 % 1 patch, 1 patch, Transdermal, Q24H, Dahal, Binaya, MD, 1 patch at 10/16/18 1050 .  lidocaine-prilocaine (EMLA) cream, , Topical, Daily, Dahal, Binaya, MD, 1 application at 14/78/29 1048 .  meclizine (ANTIVERT) tablet 25 mg, 25 mg, Oral, TID PRN, Jonelle Sidle, Mohammad L, MD .  omega-3 acid ethyl esters (LOVAZA) capsule 1 g, 1 g, Oral, QODAY, Garba, Mohammad L, MD, 1 g at 10/16/18 1044 .  ondansetron (ZOFRAN) tablet 4 mg, 4 mg, Oral, Q6H PRN **OR** ondansetron (ZOFRAN) injection 4 mg, 4 mg, Intravenous, Q6H PRN, Jonelle Sidle, Mohammad L, MD .  phenol (CHLORASEPTIC) mouth spray 1 spray, 1 spray, Mouth/Throat, PRN, Dahal, Binaya, MD, 1 spray at 10/13/18 2349 .  polyethylene glycol (MIRALAX / GLYCOLAX) packet 17 g, 17 g, Oral, Daily, Dahal, Binaya, MD, 17 g at 10/14/18 0932 .  propranolol (INDERAL) tablet 10 mg, 10 mg, Oral, TID, Jonelle Sidle, Mohammad L, MD, 10 mg at 10/16/18 1048 .  sodium chloride (OCEAN) 0.65 % nasal spray 1 spray, 1 spray, Each Nare, PRN, Elwyn Reach, MD, 1 spray at 10/11/18 2259 .  tamsulosin (FLOMAX) capsule 0.4 mg, 0.4 mg, Oral, QPC supper, Festus Aloe, MD, 0.4 mg at 10/15/18 1733 .  tiZANidine (ZANAFLEX) tablet 2 mg, 2 mg,  Oral, Q8H PRN, Elwyn Reach, MD, 2 mg at 10/13/18 2021 .  vitamin C (ASCORBIC ACID) tablet 500 mg, 500 mg, Oral, BID, Jonelle Sidle, Mohammad L, MD, 500 mg at 10/16/18 1044  Patients Current Diet:     Diet Order                  Diet regular Room service appropriate? Yes with Assist; Fluid consistency: Thin  Diet effective now               Precautions / Restrictions Precautions Precautions: Fall Precaution Comments: Very sore L side Restrictions Weight Bearing Restrictions: No   Has the patient had 2 or more falls or a fall with injury in the past year? Yes  Prior  Activity Level Limited Community (1-2x/wk): Mod I with RW and Cane short distances  Prior Functional Level Self Care: Did the patient need help bathing, dressing, using the toilet or eating? Independent  Indoor Mobility: Did the patient need assistance with walking from room to room (with or without device)? Independent  Stairs: Did the patient need assistance with internal or external stairs (with or without device)? Independent  Functional Cognition: Did the patient need help planning regular tasks such as shopping or remembering to take medications? Independent  Home Assistive Devices / Equipment Home Assistive Devices/Equipment: Environmental consultant (specify type) Home Equipment: Walker - 4 wheels, Cane - single point, Shower seat  Prior Device Use: Indicate devices/aids used by the patient prior to current illness, exacerbation or injury? Walker  Current Functional Level Cognition  Overall Cognitive Status: Within Functional Limits for tasks assessed Orientation Level: Oriented X4 General Comments: Pt is very verbose and requires frequent redirection back to task.  He scored 14 on the BIMs which indicates normal cognition     Extremity Assessment (includes Sensation/Coordination)  Upper Extremity Assessment: Generalized weakness LUE Deficits / Details: pt with intention tremor  LUE: Unable to  fully assess due to pain  Lower Extremity Assessment: Defer to PT evaluation    ADLs  Overall ADL's : Needs assistance/impaired Eating/Feeding: Independent Grooming: Wash/dry hands, Wash/dry face, Oral care, Brushing hair, Set up, Sitting Upper Body Bathing: Moderate assistance, Sitting Lower Body Bathing: Sit to/from stand, Moderate assistance Upper Body Dressing : Minimal assistance, Sitting Lower Body Dressing: Maximal assistance Lower Body Dressing Details (indicate cue type and reason): difficulty accessing feet due to fear of falling forward  Toilet Transfer: Moderate assistance, Stand-pivot, BSC Toileting- Clothing Manipulation and Hygiene: Maximal assistance, Sit to/from stand Functional mobility during ADLs: Moderate assistance    Mobility  Overal bed mobility: Needs Assistance Bed Mobility: Supine to Sit, Sit to Supine Supine to sit: Min guard, HOB elevated Sit to supine: Min guard, HOB elevated General bed mobility comments: increased time and heavy reliance on bedrails     Transfers  Overall transfer level: Needs assistance Equipment used: Rolling walker (2 wheeled) Transfers: Sit to/from Stand, [employer] Transfers Sit to Stand: Min assist, From elevated surface Stand pivot transfers: Mod assist General transfer comment: assist to power up into standing and assist for balance     Ambulation / Gait / Stairs / Wheelchair Mobility  Ambulation/Gait Ambulation/Gait assistance: Min Web designer (Feet): 2 Feet Assistive device: Rolling walker (2 wheeled) Gait Pattern/deviations: Shuffle, Trunk flexed General Gait Details: Pt stood from bed and marched in place (approx 6 steps each time) before legs tired and needing to sit down.  Pt needed cues to stand upright.  pt relied heavily on UEs for staying standing.  Pt did pregait activities - weight shifting etc.    Posture / Balance Dynamic Sitting Balance Sitting balance - Comments: able to maintain static  sitting  Balance Overall balance assessment: Needs assistance Sitting-balance support: Single extremity supported Sitting balance-Leahy Scale: Fair Sitting balance - Comments: able to maintain static sitting  Standing balance support: Bilateral upper extremity supported Standing balance-Leahy Scale: Poor Standing balance comment: reliant on bil. UE support     Special needs/care consideration BiPAP/CPAP  N/a CPM  N/a Continuous Drip IV  N/a Dialysis n/a Life Vest  N/a Oxygen  N/a Special Bed  N/a Trach Size  N/a Wound Vac n/a Skin intact Bowel mgmt:  Incontinent LBM 6/23 Bladder mgmt:  #16 indwelling catheter 6/20 Diabetic mgmt: n/a  Behavioral consideration  N/a Chemo/radiation  N/a   Previous Home Environment  Living Arrangements: Spouse/significant other  Lives With: Spouse Available Help at Discharge: Family, Available 24 hours/day Type of Home: House Home Layout: Two level Alternate Level Stairs-Number of Steps: chair lift to basement Home Access: Stairs to enter Entrance Stairs-Rails: None Entrance Stairs-Number of Steps: 2 Bathroom Shower/Tub: Public librarian, Multimedia programmer: Standard Bathroom Accessibility: Yes How Accessible: Accessible via walker [place]: No Additional Comments: Per pt. 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   SHe is able to physically care for herself, but requires supervision, and assist for IADLs   Discharge Living Setting Plans for Discharge Living Setting: Patient's home, Lives with (comment)(wife) Type of Home at Discharge: House Discharge Home Layout: Two level, Able to live on main level with bedroom/bathroom Alternate Level Stairs-Rails: (chair lift to basement) Discharge Home Access: Stairs to enter Entrance Stairs-Rails: None Entrance Stairs-Number of Steps: 2 Discharge Bathroom Shower/Tub: Tub/shower unit, Horticulturist, commercial:  Standard Discharge Bathroom Accessibility: Yes How Accessible: Accessible via walker Does the patient have any problems obtaining your medications?: No  Social/Family/Support Systems Patient Roles: Spouse, Parent(children and step children are not involved; friend, Doctor, general practice) Contact Information: Halina Maidens, friend and soon to be POA; Wife with cognition issues Anticipated Caregiver: wife can drive and is ophysically able  65 years old Anticipated Caregiver's Contact Information: see above Ability/Limitations of Caregiver: wife cognition issues Caregiver Availability: 24/7 Discharge Plan Discussed with Primary Caregiver: Yes Is Caregiver In Agreement with Plan?: Yes Does Caregiver/Family have Issues with Lodging/Transportation while Pt is in Rehab?: No  Goals/Additional Needs Patient/Family Goal for Rehab: superivison PT, superivsion OT Expected length of stay: ELOS 10 to 14 days Special Service Needs: Elta [place], friend asissts in making arrnagemtns for pt Pt/Family Agrees to Admission and willing to participate: Yes Program Orientation Provided & Reviewed with Pt/Caregiver Including Roles  & Responsibilities: Yes  Decrease burden of Care through IP rehab admission:   Possible need for SNF placement upon discharge: None anticipated.  Patient Condition: I have reviewed medical records from [facility] , spoken with CM, and patient and family member. I met with patient at the bedside for inpatient rehabilitation assessment.  Patient will benefit from ongoing PT and OT, can actively participate in 3 hours of therapy a day 5 days of the week, and can make measurable gains during the admission.  Patient will also benefit from the coordinated team approach during an Inpatient Acute Rehabilitation admission.  The patient will receive intensive therapy as well as Rehabilitation physician, nursing, social worker, and care management interventions.  Due to bladder management, bowel management,  safety, skin/wound care, disease management, medication administration, pain management and patient education the patient requires 24 hour a day rehabilitation nursing.  The patient is currently mod assist with mobility and basic ADLs.  Discharge setting and therapy post discharge at home with home health is anticipated.  Patient has agreed to participate in the Acute Inpatient Rehabilitation Program and will admit today.  Preadmission Screen Completed By:  Cleatrice Burke, 10/16/2018 1:46 PM ______________________________________________________________________   Discussed status with Dr. Posey Pronto  on  10/16/2018 at  1355 and received approval for admission today.  Admission Coordinator:  Cleatrice Burke, RN, time  9024 Date  10/16/2018   Assessment/Plan: Diagnosis: debility  1. Does the need for close, 24 hr/day Medical supervision in concert with the patient's rehab needs make  it unreasonable for this patient to be served in a less intensive setting? Potentially  2. 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 3. Due to bladder management, safety, skin/wound care, disease management and patient education, does the patient require 24 hr/day rehab nursing? Yes 4. Does the patient require coordinated care of a physician, rehab nurse, PT (1-2 hrs/day, 5 days/week) and OT (1-2 hrs/day, 5 days/week) to address physical and functional deficits in the context of the above medical diagnosis(es)? Yes Addressing deficits in the following areas: balance, endurance, locomotion, strength, transferring, bathing, dressing, toileting and psychosocial support 5. Can the patient actively participate in an intensive therapy program of at least 3 hrs of therapy 5 days a week? Potentially 6. The potential for patient to make measurable gains while on inpatient rehab is good 7. Anticipated functional outcomes  upon discharge from inpatients are: supervision and min assist PT, supervision and min assist OT, n/a SLP 8. Estimated rehab length of stay to reach the above functional goals is: 17-20 days. 9. Anticipated D/C setting: TBD 10. Anticipated post D/C treatments: HH therapy and Home excercise program 11. Overall Rehab/Functional Prognosis: good  MD Signature: Delice Lesch, MD, ABPMR    Revision History

## 2018-10-16 NOTE — Discharge Summary (Signed)
Physician Discharge Summary  Tanner CornDudley I [name] NWG:[id number] DOB: 19-Nov-1940 DOA: 10/11/2018  PCP: Koren ShiverMasneri, Shannon M, DO  Admit date: 10/11/2018 Discharge date: 10/16/2018  Admitted From: Home Discharge disposition: CIR   Code Status: Full Code   Recommendations for Outpatient Follow-Up:   1. Follow-up with urology as an outpatient in 1 week.  If patient remains in CIR, can call urology consult for voiding trial.  Discharge Diagnosis:   Principal Problem:   Pulmonary embolism and infarction [facility](HCC) Active Problems:   Hypertension   Hyponatremia   Hematuria  History of Present Illness / Brief narrative:  Patientis a 77 y.o.malewith medical history significant ofhypertension, BPHs/p TURP,restingtremors, CVA, severe constipation lives at home with his wife. History was obtained from patient as well as his wife over the phone. On 6/2, patient trippedand fellwhile walking on the yard andhit the left side of his body on the side rail. He went to the ED. Had a CT scan of chest abdomen pelvis which did not show any traumatic injury. He has been taking extra strength Tylenol at home with inadequate pain control. He has decreased mobility and progressive generalized weakness since then but not bedbound.  On 6/18, he had difficulty urinating and also passed some blood.  [facility].  I reviewed the CT scan of abdomen and pelvis as well [facility] chest abdomen pelvis was obtained which showed irregular nodular thickening of the prostate causing mass-effect on the bladder base. Floating material/debris within the bladder compatible with blood products and/or infectious debris's. No obstructing ureteral calculi or hydronephrosis.  CT Angioof chest was positive for bilateral lower lobe segmental pulmonary embolism associated with atelectasis.  He was started on heparin drip and transferred  to us.  On arrival to our hospital, patient was hemodynamically stable. Initial blood work with WBC count slightly elevated to 11,000. COVID-19 test negative. Bladder scan noted more than 1800 urine. Foley catheter was placed, 2 L of urine drained,also noted bloody urine towards the end. Ultrasound duplex of lower extremities showed left peroneal vein DVT.  Hospital Course:  Hematuria/urinary retention -Presented with hematuria and bladder spasms.  Bladder scan noted more than 1800 urine. Foley catheter was placed, >2 L of urine drained,also noted bloody urine towards the end.  Bladder spasms resolved with decompression. -History of BPH s/p TURP. Currently on Avodart and Flomax. - No evidence ofUTI. -Urologyconsult obtained.Recommendation isto maintain Foley catheter at discharge and follow-up with Dr.Eskridge forvoiding trial in 1 week. -Per urologist, gross hematuria is likely related to rapiddecompression of his bladder.  His urine is cleared now.  Bilateral lower lobe pulmonary embolism -CT Angioof chest was positive for bilateral lower lobe segmental pulmonary embolism associated with atelectasis. -Ultrasound duplexlower extremitiesshowed left peroneal vein DVT. -Patient had a fall on 6/2 hitting head chest,no rib fracture and CT scan. He progressively became weak, less mobile and started getting short of breath.DVT/PE is probably related to decreased mobility. -No history of DVT/PE in the past. He apparently was on aspirin and Plavix in the past for TIA. Never on an anticoagulant. -Initially started on heparin drip.  He has been now switched to Eliquis.  10 mg twice daily for first week followed by 5 mg twice daily.  -Requiring 2 L oxygen by nasal cannula.  Gradually wean down as tolerated..  Chest wall pain/bilateral atelectasis -Patient has been having pain and secondary hypoventilation after a fall on 6/2. He wants to avoid  any form of narcotics and has  been relying only on extra strength Tylenol for pain control. Added lidocaine patch, incentive spirometry. No clinical evidence of pneumonia.  History ofTIA -  -On aspirinandstatinat home.Because of the need to be on anticoagulation andassociatedhigh risk of bleeding, I stoppedaspirin.  -Patient has been on Inderal 10 mg 3 times daily since prior to admission. ?  Indication.  It was continued on hospital.  Heart rate is remained in 80s and blood pressure has remained in normal to low normal range.  Continue the same.  Impaired mobility  -Seen by physical therapy. Recommended CIR.  [facility]   Subjective:  Patient was seen and examined this morning.  Pleasant.  Sitting up in chair.  Feels much better.  Discharge Exam:   Vitals:   10/16/18 0023 10/16/18 0414 10/16/18 0750 10/16/18 1117  BP: 108/74 101/73 108/86 115/77  Pulse: 87 74 79 (!) 102  Resp: 18 11 16 18   Temp: 98.2 F (36.8 C) 98.2 F (36.8 C) 97.7 F (36.5 C) 98 F (36.7 C)  TempSrc: Oral Oral Oral Oral  SpO2: 93% 95% 92% 94%  Weight:      Height:        Body mass index is 30.87 kg/m.  General exam: Appears calm and comfortable.  Skin: No rashes, lesions or ulcers. HEENT: Atraumatic, normocephalic, supple neck, no obvious bleeding Lungs: Clear to auscultation bilaterally CVS: Regular rate and rhythm, no murmur GI/Abd soft, nontender, nondistended, bowel sound present CNS: Alert, awake, oriented x3 Psychiatry: Mood appropriate Extremities: No pedal edema, no calf tenderness  Discharge Instructions:  Wound care: None Discharge Instructions    Diet - low sodium heart healthy   Complete by: As directed    Increase activity slowly   Complete by: As directed      Follow-up Information    Jerilee FieldEskridge, Matthew, MD Follow up.   Specialty: Urology Why: Office with call with appointment  Contact information: 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   apixaban 5 MG Tabs tablet Commonly known as: ELIQUIS Take 2 tablets (10 mg total) by mouth 2 (two) times daily.   apixaban 5 MG Tabs tablet Commonly known as: ELIQUIS Take 1 tablet (5 mg total) by mouth 2 (two) times daily. Start taking on: October 22, 2018   atorvastatin 20 MG tablet Commonly known as: LIPITOR Take 1 tablet (20 mg total) by mouth daily at 6 PM.  CALCIUM 500 + D PO Take 500 mg by mouth every morning.   diclofenac sodium 1 % Gel Commonly known as: VOLTAREN Apply 2 g topically 4 (four) times daily.   dutasteride 0.5 MG capsule Commonly known as: AVODART Take 0.5 mg by mouth daily.   fluticasone 50 MCG/ACT nasal spray Commonly known as: FLONASE Place 1 spray into both nostrils daily as needed for allergies or rhinitis.   hydrocortisone 2.5 % rectal cream Commonly known as: ANUSOL-HC Place 1 application rectally daily as needed (rectal swelling).   lidocaine 5 % Commonly known as: LIDODERM Place 1 patch onto the skin daily. Remove & Discard patch within 12 hours or as directed by MD Start taking on: October 17, 2018   lidocaine-prilocaine  cream Commonly known as: EMLA Apply 1 application topically daily.   Lutein 20 MG Tabs Take 20 mg by mouth daily.   multivitamin with minerals Tabs tablet Take 1 tablet by mouth every other day.   propranolol 10 MG tablet Commonly known as: INDERAL Take 10 mg by mouth 3 (three) times daily.   senna-docusate 8.6-50 MG tablet Commonly known as: Senokot-S Take 1 tablet by mouth at bedtime as needed for moderate constipation.   tamsulosin 0.4 MG Caps capsule Commonly known as: FLOMAX Take 1 capsule (0.4 mg total) by mouth daily after supper.   tiZANidine 2 MG tablet Commonly known as: ZANAFLEX Take 1 tablet (2 mg total) by mouth every 8 (eight) hours as needed for muscle spasms.   vitamin C with rose hips 500 MG tablet Take 500 mg by mouth 2 (two) times daily.   Zinc 50 MG Tabs Take 50 mg by mouth daily.       Time coordinating discharge: 45 minutes  The results of significant diagnostics from this hospitalization (including imaging, microbiology, ancillary and laboratory) are listed below for reference.    Procedures and Diagnostic Studies:   Vas [place] Lower Extremity Venous (dvt)  Result Date: 10/12/2018  Lower Venous Study Indications: Edema, and pulmonary embolism.  Risk Factors: Confirmed PE. Limitations: Edema. Comparison Study: Prior negative study of the LLE 01/06/15 is available Performing Technologist: Sherren Kerns RVS  Examination Guidelines: A complete evaluation includes B-mode imaging, spectral Doppler, color Doppler, and power Doppler as needed of all accessible portions of each vessel. Bilateral testing is considered an integral part of a complete examination. Limited examinations for reoccurring indications may be performed as noted.  +---------+---------------+---------+-----------+----------+----------+ RIGHT    CompressibilityPhasicitySpontaneityPropertiesSummary    +---------+---------------+---------+-----------+----------+----------+ CFV      Full            Yes      Yes                             +---------+---------------+---------+-----------+----------+----------+ SFJ      Full                                                    +---------+---------------+---------+-----------+----------+----------+ FV Prox  Full                                                    +---------+---------------+---------+-----------+----------+----------+ FV Mid   Full                                                    +---------+---------------+---------+-----------+----------+----------+  FV DistalFull                                                    +---------+---------------+---------+-----------+----------+----------+ PFV      Full                                                    +---------+---------------+---------+-----------+----------+----------+ POP                     Yes      Yes                             +---------+---------------+---------+-----------+----------+----------+ PTV                                                   visualized +---------+---------------+---------+-----------+----------+----------+ PERO                                                  visualized +---------+---------------+---------+-----------+----------+----------+   +---------+---------------+---------+-----------+----------+----------+ LEFT     CompressibilityPhasicitySpontaneityPropertiesSummary    +---------+---------------+---------+-----------+----------+----------+ CFV      Full           Yes      Yes                             +---------+---------------+---------+-----------+----------+----------+ SFJ      Full                                                    +---------+---------------+---------+-----------+----------+----------+ FV Prox  Full                                                    +---------+---------------+---------+-----------+----------+----------+ FV Mid   Full                                                     +---------+---------------+---------+-----------+----------+----------+ FV DistalFull                                                    +---------+---------------+---------+-----------+----------+----------+ PFV      Full                                                    +---------+---------------+---------+-----------+----------+----------+  POP      Full                                                    +---------+---------------+---------+-----------+----------+----------+ PTV                                                   visualized +---------+---------------+---------+-----------+----------+----------+ PERO     None                                         Acute      +---------+---------------+---------+-----------+----------+----------+     Summary: Right: There is no evidence of deep vein thrombosis in the lower extremity. However, portions of this examination were limited- see technologist comments above. Left: Findings consistent with acute deep vein thrombosis involving the left peroneal veins.  *See table(s) above for measurements and observations. Electronically signed by Sherald Hesshristopher Clark MD on 10/12/2018 at 1:34:25 PM.    Final      Labs:   Basic Metabolic Panel: Recent Labs  Lab 10/11/18 2243 10/12/18 0735 10/14/18 0429 10/15/18 0414 10/16/18 0430  NA 136 136 138 139 140  K 4.0 3.8 3.6 3.7 3.8  CL 102 103 108 109 109  CO2 23 21* 23 23 21*  GLUCOSE 120* 108* 114* 121* 119*  BUN 19 21 11 9 12   CREATININE 1.01 0.91 0.81 0.88 0.92  CALCIUM 9.5 9.3 8.8* 8.9 9.0   GFR Estimated Creatinine Clearance: 92.3 mL/min (by C-G formula based on SCr of 0.92 mg/dL). Liver Function Tests: Recent Labs  Lab 10/12/18 0735  AST 18  ALT 18  ALKPHOS 87  BILITOT 1.1  PROT 7.0  ALBUMIN 3.0*   No results for input(s): LIPASE, AMYLASE in the last 168 hours. No results for input(s): AMMONIA in the last 168  hours. Coagulation profile No results for input(s): INR, PROTIME in the last 168 hours.  CBC: Recent Labs  Lab 10/11/18 2243 10/12/18 0735 10/13/18 0435 10/14/18 0429 10/15/18 0414 10/16/18 0430  WBC 10.4 10.9* 11.2* 10.0 10.0 8.5  NEUTROABS 6.9  --   --   --   --   --   HGB 14.2 13.7 12.2* 12.3* 11.7* 11.6*  HCT 43.0 42.5 37.6* 37.7* 35.5* 36.1*  MCV 96.8 97.7 96.9 97.2 97.3 98.4  PLT 372 365 351 342 348 337   Cardiac Enzymes: No results for input(s): CKTOTAL, CKMB, CKMBINDEX, TROPONINI in the last 168 hours. BNP: Invalid input(s): POCBNP CBG: No results for input(s): GLUCAP in the last 168 hours. D-Dimer No results for input(s): DDIMER in the last 72 hours. Hgb A1c No results for input(s): HGBA1C in the last 72 hours. Lipid Profile No results for input(s): CHOL, HDL, LDLCALC, TRIG, CHOLHDL, LDLDIRECT in the last 72 hours. Thyroid function studies No results for input(s): TSH, T4TOTAL, T3FREE, THYROIDAB in the last 72 hours.  Invalid input(s): FREET3 Anemia work up No results for input(s): VITAMINB12, FOLATE, FERRITIN, TIBC, IRON, RETICCTPCT in the last 72 hours. Microbiology Recent Results (from the past 240 hour(s))  SARS Coronavirus 2     Status: None   Collection Time:  10/11/18 10:38 PM  Result Value Ref Range Status   SARS Coronavirus 2 NOT DETECTED NOT DETECTED Final    Comment: (NOTE) SARS-CoV-2 target nucleic acids are NOT DETECTED. The SARS-CoV-2 RNA is generally detectable in upper and lower respiratory specimens during the acute phase of infection.  Negative  results do not preclude SARS-CoV-2 infection, do not rule out co-infections with other pathogens, and should not be used as the sole basis for treatment or other patient management decisions.  Negative results must be combined with clinical observations, patient history, and epidemiological information. The expected result is Not Detected. Fact Sheet for  Patients: http://www.biofiredefense.com/wp-content/uploads/2020/03/BIOFIRE-COVID -19-patients.pdf Fact Sheet for Healthcare Providers: http://www.biofiredefense.com/wp-content/uploads/2020/03/BIOFIRE-COVID -19-hcp.pdf This test is not yet approved or cleared by the [place] and  has been authorized for detection and/or diagnosis of SARS-CoV-2 by FDA under an Emergency Use Authorization (EUA).  This EUA will remain in effec t (meaning this test can be used) for the duration of  the COVID-19 declaration under Section 564(b)(1) of the Act, 21 U.S.C. section 360bbb-3(b)(1), unless the authorization is terminated or revoked sooner. Performed at [facility], [place] [address] [place], [place] [address]     Signed: Terrilee Croak  [facility] 10/16/2018, 3:16 PM

## 2018-10-16 NOTE — Progress Notes (Signed)
Patient transferred to Rehab 4W24 telemetry discontinued, report given.  Patient is comfortable with no pain,  No shortness  Of breathe on room air.  Alert and orientedX 4,  Vital signs stable upon discharge.

## 2018-10-16 NOTE — Progress Notes (Signed)
Patient ID: [name], male   DOB: April 21, 1941, 78 y.o.   MRN: [id number]  Admit to unit.  Oriented to rehab routine. Reviewed medicines, therapy schedule and plan of care. .me

## 2018-10-16 NOTE — PMR Pre-admission (Addendum)
PMR Admission Coordinator Pre-Admission Assessment  Patient: [name] is an 78 y.o., male MRN: [id number] DOB: 11/13/1940 Height: '6\' 3"'$  (190.5 cm) Weight: 112 kg  Insurance Information HMO:     PPO: yes     PCP:      IPA:      80/20:      OTHER: medicare advantage  PRIMARY: [employer] medicare      Policy#: [id number]      Subscriber: pt CM Name: Tillie Rung      Fax#: [phone] Pre-Cert#: [id number]  F/u Dorthula Nettles in 7 days    Employer:  Benefits:  Phone #: [phone]     Name:  Eff. Date: 04/24/2018     Deduct: none      Out of Pocket Max: $4000      Life Max: none CIR: $160 co pay per day days 1 until 10      SNF: no co pay days 1 until 20; $50 co pay per day days 21 until 100 Outpatient: $20 co pay per visit     Co-Pay: visits per medical necessity Home Health: 100%      Co-Pay: visits per medical neccesity DME: 80%     Co-Pay: 20% Providers: in network  SECONDARY: none       Medicaid Application Date:       Case Manager:  Disability Application Date:       Case Worker:   The "Data Collection Information Summary" for patients in Inpatient Rehabilitation Facilities with attached "Privacy Act [place] Records" was provided and verbally reviewed with: Patient  Emergency Contact Information Contact Information    Name Relation Home Work Anderson [phone]  [phone]      Current Medical History  Patient Admitting Diagnosis: debility  History of Present Illness:78 year old right-handed male with history of hypertension, BPH status post TURP, resting tremors, 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   Presented 10/10/2020 [facility] emergency room with bouts of hematuria, constipation and generalized weakness as well as shortness of breath.  Recent fall noted per family and debilitated with CT of the chest 09/24/2018 showing no definitive evidence of traumatic injury.  As part of initial work-up found to have bilateral pulmonary embolisms.  He was transferred to  [facility] for further evaluation.  Review of CT Angio of chest showed bilateral lower lobe segmental pulmonary embolism associated with atelectasis.  He was placed on heparin drip.  COVID negative.  Bladder scan noted more than 1800 cc of urine.  Ultrasound duplex of lower extremity showed left peroneal vein DVT.  Urology consulted Dr. Alyson Ingles in regards to hematuria felt to be related to rapid decompression of his bladder.  Patient was placed on Flomax and would follow-up with urology services versus voiding trial while in the hospital.  Patient was transitioned from heparin to Eliquis.  Close monitoring of any bleeding episodes.  Latest hemoglobin 11.6 and stable.    Patient's medical record from [facility] has been reviewed by the rehabilitation admission coordinator and physician.  Past Medical History  Past Medical History:  Diagnosis Date  . Arrhythmia   . Arthritis   . Hypertension   . Stroke [facility])    no deficits  . Thyroid nodule   . Tremors of nervous system   . Vertigo     Family History   family history includes Hypertension in his father and mother.  Prior Rehab/Hospitalizations Has the patient had prior rehab or hospitalizations prior to admission? Yes  Has the patient had major surgery during 100 days prior to admission? No   Current Medications  Current Facility-Administered Medications:  .  acetaminophen (TYLENOL) tablet 650 mg, 650 mg, Oral, Q6H PRN, Elwyn Reach, MD, 650 mg at 10/13/18 2022 .  apixaban (ELIQUIS) tablet 10 mg, 10 mg, Oral, BID, 10 mg at 10/16/18 1045 **FOLLOWED BY** [START ON 10/22/2018] apixaban (ELIQUIS) tablet 5 mg, 5 mg, Oral, BID, Dahal, Binaya, MD .  atorvastatin (LIPITOR) tablet 20 mg, 20 mg, Oral, q1800, Gala Romney L, MD, 20 mg at 10/15/18 1732 .  bisacodyl (DULCOLAX) suppository 10 mg, 10 mg, Rectal, Daily, Dahal, Binaya, MD, 10 mg at 10/14/18 0933 .  calcium-vitamin D (OSCAL WITH D) 500-200 MG-UNIT per tablet 1  tablet, 1 tablet, Oral, BH-q7a, Elwyn Reach, MD, 1 tablet at 10/16/18 0839 .  dutasteride (AVODART) capsule 0.5 mg, 0.5 mg, Oral, Daily, Jonelle Sidle, Mohammad L, MD, 0.5 mg at 10/16/18 1047 .  fluticasone (FLONASE) 50 MCG/ACT nasal spray 1 spray, 1 spray, Each Nare, Daily PRN, Garba, Mohammad L, MD .  lidocaine (LIDODERM) 5 % 1 patch, 1 patch, Transdermal, Q24H, Dahal, Binaya, MD, 1 patch at 10/16/18 1050 .  lidocaine-prilocaine (EMLA) cream, , Topical, Daily, Dahal, Binaya, MD, 1 application at 58/52/77 1048 .  meclizine (ANTIVERT) tablet 25 mg, 25 mg, Oral, TID PRN, Jonelle Sidle, Mohammad L, MD .  omega-3 acid ethyl esters (LOVAZA) capsule 1 g, 1 g, Oral, QODAY, Garba, Mohammad L, MD, 1 g at 10/16/18 1044 .  ondansetron (ZOFRAN) tablet 4 mg, 4 mg, Oral, Q6H PRN **OR** ondansetron (ZOFRAN) injection 4 mg, 4 mg, Intravenous, Q6H PRN, Jonelle Sidle, Mohammad L, MD .  phenol (CHLORASEPTIC) mouth spray 1 spray, 1 spray, Mouth/Throat, PRN, Dahal, Binaya, MD, 1 spray at 10/13/18 2349 .  polyethylene glycol (MIRALAX / GLYCOLAX) packet 17 g, 17 g, Oral, Daily, Dahal, Binaya, MD, 17 g at 10/14/18 0932 .  propranolol (INDERAL) tablet 10 mg, 10 mg, Oral, TID, Jonelle Sidle, Mohammad L, MD, 10 mg at 10/16/18 1048 .  sodium chloride (OCEAN) 0.65 % nasal spray 1 spray, 1 spray, Each Nare, PRN, Elwyn Reach, MD, 1 spray at 10/11/18 2259 .  tamsulosin (FLOMAX) capsule 0.4 mg, 0.4 mg, Oral, QPC supper, Festus Aloe, MD, 0.4 mg at 10/15/18 1733 .  tiZANidine (ZANAFLEX) tablet 2 mg, 2 mg, Oral, Q8H PRN, Elwyn Reach, MD, 2 mg at 10/13/18 2021 .  vitamin C (ASCORBIC ACID) tablet 500 mg, 500 mg, Oral, BID, Jonelle Sidle, Mohammad L, MD, 500 mg at 10/16/18 1044  Patients Current Diet:  Diet Order            Diet regular Room service appropriate? Yes with Assist; Fluid consistency: Thin  Diet effective now              Precautions / Restrictions Precautions Precautions: Fall Precaution Comments: Very sore L  side Restrictions Weight Bearing Restrictions: No   Has the patient had 2 or more falls or a fall with injury in the past year? Yes  Prior Activity Level Limited Community (1-2x/wk): Mod I with RW and Cane short distances  Prior Functional Level Self Care: Did the patient need help bathing, dressing, using the toilet or eating? Independent  Indoor Mobility: Did the patient need assistance with walking from room to room (with or without device)? Independent  Stairs: Did the patient need assistance with internal or external stairs (with or without device)? Independent  Functional Cognition: Did the patient need help planning regular tasks such  as shopping or remembering to take medications? Independent  Home Assistive Devices / Equipment Home Assistive Devices/Equipment: Environmental consultant (specify type) Home Equipment: Walker - 4 wheels, Cane - single point, Shower seat  Prior Device Use: Indicate devices/aids used by the patient prior to current illness, exacerbation or injury? Walker  Current Functional Level Cognition  Overall Cognitive Status: Within Functional Limits for tasks assessed Orientation Level: Oriented X4 General Comments: Pt is very verbose and requires frequent redirection back to task.  He scored 14 on the BIMs which indicates normal cognition     Extremity Assessment (includes Sensation/Coordination)  Upper Extremity Assessment: Generalized weakness LUE Deficits / Details: pt with intention tremor  LUE: Unable to fully assess due to pain  Lower Extremity Assessment: Defer to PT evaluation    ADLs  Overall ADL's : Needs assistance/impaired Eating/Feeding: Independent Grooming: Wash/dry hands, Wash/dry face, Oral care, Brushing hair, Set up, Sitting Upper Body Bathing: Moderate assistance, Sitting Lower Body Bathing: Sit to/from stand, Moderate assistance Upper Body Dressing : Minimal assistance, Sitting Lower Body Dressing: Maximal assistance Lower Body Dressing  Details (indicate cue type and reason): difficulty accessing feet due to fear of falling forward  Toilet Transfer: Moderate assistance, Stand-pivot, BSC Toileting- Clothing Manipulation and Hygiene: Maximal assistance, Sit to/from stand Functional mobility during ADLs: Moderate assistance    Mobility  Overal bed mobility: Needs Assistance Bed Mobility: Supine to Sit, Sit to Supine Supine to sit: Min guard, HOB elevated Sit to supine: Min guard, HOB elevated General bed mobility comments: increased time and heavy reliance on bedrails     Transfers  Overall transfer level: Needs assistance Equipment used: Rolling walker (2 wheeled) Transfers: Sit to/from Stand, [employer] Transfers Sit to Stand: Min assist, From elevated surface Stand pivot transfers: Mod assist General transfer comment: assist to power up into standing and assist for balance     Ambulation / Gait / Stairs / Wheelchair Mobility  Ambulation/Gait Ambulation/Gait assistance: Min Web designer (Feet): 2 Feet Assistive device: Rolling walker (2 wheeled) Gait Pattern/deviations: Shuffle, Trunk flexed General Gait Details: Pt stood from bed and marched in place (approx 6 steps each time) before legs tired and needing to sit down.  Pt needed cues to stand upright.  pt relied heavily on UEs for staying standing.  Pt did pregait activities - weight shifting etc.    Posture / Balance Dynamic Sitting Balance Sitting balance - Comments: able to maintain static sitting  Balance Overall balance assessment: Needs assistance Sitting-balance support: Single extremity supported Sitting balance-Leahy Scale: Fair Sitting balance - Comments: able to maintain static sitting  Standing balance support: Bilateral upper extremity supported Standing balance-Leahy Scale: Poor Standing balance comment: reliant on bil. UE support     Special needs/care consideration BiPAP/CPAP  N/a CPM  N/a Continuous Drip IV  N/a Dialysis n/a Life  Vest  N/a Oxygen  N/a Special Bed  N/a Trach Size  N/a Wound Vac n/a Skin intact Bowel mgmt:  Incontinent LBM 6/23 Bladder mgmt:  #16 indwelling catheter 6/20 Diabetic mgmt: n/a Behavioral consideration  N/a Chemo/radiation  N/a   Previous Home Environment  Living Arrangements: Spouse/significant other  Lives With: Spouse Available Help at Discharge: Family, Available 24 hours/day Type of Home: House Home Layout: Two level Alternate Level Stairs-Number of Steps: chair lift to basement Home Access: Stairs to enter Entrance Stairs-Rails: None Entrance Stairs-Number of Steps: 2 Bathroom Shower/Tub: Tub/shower unit, Multimedia programmer: Standard Bathroom Accessibility: Yes How Accessible: Accessible via walker [place]: No  Additional Comments: Per pt. 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   SHe is able to physically care for herself, but requires supervision, and assist for IADLs   Discharge Living Setting Plans for Discharge Living Setting: Patient's home, Lives with (comment)(wife) Type of Home at Discharge: House Discharge Home Layout: Two level, Able to live on main level with bedroom/bathroom Alternate Level Stairs-Rails: (chair lift to basement) Discharge Home Access: Stairs to enter Entrance Stairs-Rails: None Entrance Stairs-Number of Steps: 2 Discharge Bathroom Shower/Tub: Tub/shower unit, Horticulturist, commercial: Standard Discharge Bathroom Accessibility: Yes How Accessible: Accessible via walker Does the patient have any problems obtaining your medications?: No  Social/Family/Support Systems Patient Roles: Spouse, Parent(children and step children are not involved; friend, Doctor, general practice) Contact Information: Halina Maidens, friend and soon to be POA; Wife with cognition issues Anticipated Caregiver: wife can drive and is ophysically able  40 years old Anticipated Caregiver's Contact  Information: see above Ability/Limitations of Caregiver: wife cognition issues Caregiver Availability: 24/7 Discharge Plan Discussed with Primary Caregiver: Yes Is Caregiver In Agreement with Plan?: Yes Does Caregiver/Family have Issues with Lodging/Transportation while Pt is in Rehab?: No  Goals/Additional Needs Patient/Family Goal for Rehab: superivison PT, superivsion OT Expected length of stay: ELOS 10 to 14 days Special Service Needs: Elta [place], friend asissts in making arrnagemtns for pt Pt/Family Agrees to Admission and willing to participate: Yes Program Orientation Provided & Reviewed with Pt/Caregiver Including Roles  & Responsibilities: Yes  Decrease burden of Care through IP rehab admission:   Possible need for SNF placement upon discharge: None anticipated.  Patient Condition: I have reviewed medical records from [facility] , spoken with CM, and patient and family member. I met with patient at the bedside for inpatient rehabilitation assessment.  Patient will benefit from ongoing PT and OT, can actively participate in 3 hours of therapy a day 5 days of the week, and can make measurable gains during the admission.  Patient will also benefit from the coordinated team approach during an Inpatient Acute Rehabilitation admission.  The patient will receive intensive therapy as well as Rehabilitation physician, nursing, social worker, and care management interventions.  Due to bladder management, bowel management, safety, skin/wound care, disease management, medication administration, pain management and patient education the patient requires 24 hour a day rehabilitation nursing.  The patient is currently mod assist with mobility and basic ADLs.  Discharge setting and therapy post discharge at home with home health is anticipated.  Patient has agreed to participate in the Acute Inpatient Rehabilitation Program and will admit today.  Preadmission Screen Completed By:  Cleatrice Burke, 10/16/2018 1:46 PM ______________________________________________________________________   Discussed status with Dr. Posey Pronto  on  10/16/2018 at  1355 and received approval for admission today.  Admission Coordinator:  Cleatrice Burke, RN, time  6294 Date  10/16/2018   Assessment/Plan: Diagnosis: debility  1. Does the need for close, 24 hr/day Medical supervision in concert with the patient's rehab needs make it unreasonable for this patient to be served in a less intensive setting? Potentially  2. 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 3. Due to bladder management, safety, skin/wound care, disease management and patient education, does the patient require 24 hr/day rehab nursing? Yes 4. Does the patient require coordinated care of a physician, rehab nurse, PT (1-2 hrs/day, 5 days/week) and OT (1-2 hrs/day, 5 days/week) to address physical and  functional deficits in the context of the above medical diagnosis(es)? Yes Addressing deficits in the following areas: balance, endurance, locomotion, strength, transferring, bathing, dressing, toileting and psychosocial support 5. Can the patient actively participate in an intensive therapy program of at least 3 hrs of therapy 5 days a week? Potentially 6. The potential for patient to make measurable gains while on inpatient rehab is good 7. Anticipated functional outcomes upon discharge from inpatients are: supervision and min assist PT, supervision and min assist OT, n/a SLP 8. Estimated rehab length of stay to reach the above functional goals is: 17-20 days. 9. Anticipated D/C setting: TBD 10. Anticipated post D/C treatments: HH therapy and Home excercise program 11. Overall Rehab/Functional Prognosis: good  MD Signature: Delice Lesch, MD, ABPMR

## 2018-10-16 NOTE — Discharge Instructions (Signed)
Indwelling Urinary Catheter Care, Adult An indwelling urinary catheter is a thin tube that is put into your bladder. The tube helps to drain pee (urine) out of your body. The tube goes in through your urethra. Your urethra is where pee comes out of your body. Your pee will come out through the catheter, then it will go into a bag (drainage bag). Take good care of your catheter so it will work well. How to wear your catheter and bag Supplies needed  Sticky tape (adhesive tape) or a leg strap.  Alcohol wipe or soap and water (if you use tape).  A clean towel (if you use tape).  Large overnight bag.  Smaller bag (leg bag). Wearing your catheter Attach your catheter to your leg with tape or a leg strap.  Make sure the catheter is not pulled tight.  If a leg strap gets wet, take it off and put on a dry strap.  If you use tape to hold the bag on your leg: 1. Use an alcohol wipe or soap and water to wash your skin where the tape made it sticky before. 2. Use a clean towel to pat-dry that skin. 3. Use new tape to make the bag stay on your leg. Wearing your bags You should have been given a large overnight bag.  You may wear the overnight bag in the day or night.  Always have the overnight bag lower than your bladder.  Do not let the bag touch the floor.  Before you go to sleep, put a clean plastic bag in a wastebasket. Then hang the overnight bag inside the wastebasket. You should also have a smaller leg bag that fits under your clothes.  Always wear the leg bag below your knee.  Do not wear your leg bag at night. How to care for your skin and catheter Supplies needed  A clean washcloth.  Water and mild soap.  A clean towel. Caring for your skin and catheter      Clean the skin around your catheter every day: ? Wash your hands with soap and water. ? Wet a clean washcloth in warm water and mild soap. ? Clean the skin around your urethra. ? If you are male: ? Gently  spread the folds of skin around your vagina (labia). ? With the washcloth in your other hand, wipe the inner side of your labia on each side. Wipe from front to back. ? If you are male: ? Pull back any skin that covers the end of your penis (foreskin). ? With the washcloth in your other hand, wipe your penis in small circles. Start wiping at the tip of your penis, then move away from the catheter. ? With your free hand, hold the catheter close to where it goes into your body. ? Keep holding the catheter during cleaning so it does not get pulled out. ? With the washcloth in your other hand, clean the catheter. ? Only wipe downward on the catheter. ? Do not wipe upward toward your body. Doing this may push germs into your urethra and cause infection. ? Use a clean towel to pat-dry the catheter and the skin around it. Make sure to wipe off all soap. ? Wash your hands with soap and water.  Shower every day. Do not take baths.  Do not use cream, ointment, or lotion on the area where the catheter goes into your body, unless your doctor tells you to.  Do not use powders, sprays, or lotions  on your genital area.  Check your skin around the catheter every day for signs of infection. Check for: ? Redness, swelling, or pain. ? Fluid or blood. ? Warmth. ? Pus or a bad smell. How to empty the bag Supplies needed  Rubbing alcohol.  Gauze pad or cotton ball.  Tape or a leg strap. Emptying the bag Pour the pee out of your bag when it is ?- full, or at least 2-3 times a day. Do this for your overnight bag and your leg bag. 1. Wash your hands with soap and water. 2. Separate (detach) the bag from your leg. 3. Hold the bag over the toilet or a clean pail. Keep the bag lower than your hips and bladder. This is so the pee (urine) does not go back into the tube. 4. Open the pour spout. It is at the bottom of the bag. 5. Empty the pee into the toilet or pail. Do not let the pour spout touch any  surface. 6. Put rubbing alcohol on a gauze pad or cotton ball. 7. Use the gauze pad or cotton ball to clean the pour spout. 8. Close the pour spout. 9. Attach the bag to your leg with tape or a leg strap. 10. Wash your hands with soap and water. Follow instructions for cleaning the drainage bag:  From the product maker.  As told by your doctor. How to change the bag Supplies needed  Alcohol wipes.  A clean bag.  Tape or a leg strap. Changing the bag Replace your bag with a clean bag once a month. If it starts to leak, smell bad, or look dirty, change it sooner. 1. Wash your hands with soap and water. 2. Separate the dirty bag from your leg. 3. Pinch the catheter with your fingers so that pee does not spill out. 4. Separate the catheter tube from the bag tube where these tubes connect (at the connection valve). Do not let the tubes touch any surface. 5. Clean the end of the catheter tube with an alcohol wipe. Use a different alcohol wipe to clean the end of the bag tube. 6. Connect the catheter tube to the tube of the clean bag. 7. Attach the clean bag to your leg with tape or a leg strap. Do not make the bag tight on your leg. 8. Wash your hands with soap and water. General rules   Never pull on your catheter. Never try to take it out. Doing that can hurt you.  Always wash your hands before and after you touch your catheter or bag. Use a mild, fragrance-free soap. If you do not have soap and water, use hand sanitizer.  Always make sure there are no twists or bends (kinks) in the catheter tube.  Always make sure there are no leaks in the catheter or bag.  Drink enough fluid to keep your pee pale yellow.  Do not take baths, swim, or use a hot tub.  If you are male, wipe from front to back after you poop (have a bowel movement). Contact a doctor if:  Your pee is cloudy.  Your pee smells worse than usual.  Your catheter gets clogged.  Your catheter leaks.  Your  bladder feels full. Get help right away if:  You have redness, swelling, or pain where the catheter goes into your body.  You have fluid, blood, pus, or a bad smell coming from the area where the catheter goes into your body.  Your skin feels warm  where the catheter goes into your body.  You have a fever.  You have pain in your: ? Belly (abdomen). ? Legs. ? Lower back. ? Bladder.  You see blood in the catheter.  Your pee is pink or red.  You feel sick to your stomach (nauseous).  You throw up (vomit).  You have chills.  Your pee is not draining into the bag.  Your catheter gets pulled out. Summary  An indwelling urinary catheter is a thin tube that is placed into the bladder to help drain pee (urine) out of the body.  The catheter is placed into the part of the body that drains pee from the bladder (urethra).  Taking good care of your catheter will keep it working properly and help prevent problems.  Always wash your hands before and after touching your catheter or bag.  Never pull on your catheter or try to take it out. This information is not intended to replace advice given to you by your health care provider. Make sure you discuss any questions you have with your health care provider. Document Released: 08/05/2012 Document Revised: 10/01/2017 Document Reviewed: 11/24/2016 Elsevier Interactive Patient Education  2019 [employer].  -------------------------- Information on my medicine - ELIQUIS (apixaban)  Why was Eliquis prescribed for you? Eliquis was prescribed to treat blood clots that may have been found in the veins of your legs (deep vein thrombosis) or in your lungs (pulmonary embolism) and to reduce the risk of them occurring again.  What do You need to know about Eliquis ? The starting dose is 10 mg (two 5 mg tablets) taken TWICE daily for the FIRST SEVEN (7) DAYS, then on 10/22/18  the dose is reduced to ONE 5 mg tablet taken TWICE daily.   Eliquis may be taken with or without food.   Try to take the dose about the same time in the morning and in the evening. If you have difficulty swallowing the tablet whole please discuss with your pharmacist how to take the medication safely.  Take Eliquis exactly as prescribed and DO NOT stop taking Eliquis without talking to the doctor who prescribed the medication.  Stopping may increase your risk of developing a new blood clot.  Refill your prescription before you run out.  After discharge, you should have regular check-up appointments with your healthcare provider that is prescribing your Eliquis.    What do you do if you miss a dose? If a dose of ELIQUIS is not taken at the scheduled time, take it as soon as possible on the same day and twice-daily administration should be resumed. The dose should not be doubled to make up for a missed dose.  Important Safety Information A possible side effect of Eliquis is bleeding. You should call your healthcare provider right away if you experience any of the following: ? Bleeding from an injury or your nose that does not stop. ? Unusual colored urine (red or dark brown) or unusual colored stools (red or black). ? Unusual bruising for unknown reasons. ? A serious fall or if you hit your head (even if there is no bleeding).  Some medicines may interact with Eliquis and might increase your risk of bleeding or clotting while on Eliquis. To help avoid this, consult your healthcare provider or pharmacist prior to using any new prescription or non-prescription medications, including herbals, vitamins, non-steroidal anti-inflammatory drugs (NSAIDs) and supplements.  This website has more information on Eliquis (apixaban): http://www.eliquis.com/eliquis/home

## 2018-10-16 NOTE — Progress Notes (Signed)
Inpatient Rehabilitation Admissions Coordinator  I have insurance approval to admit pt to inpt rehab today. I met with patient at bedside and he is in agreement to admit. I contacted Dr. Pietro Cassis and will arrange admit for today. RN CM made aware.  Danne Baxter, RN, MSN Rehab Admissions Coordinator [phone] 10/16/2018 12:38 PM
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
## 2018-10-17 ENCOUNTER — Inpatient Hospital Stay (HOSPITAL_COMMUNITY): Payer: Medicare Other | Admitting: Occupational Therapy

## 2018-10-17 ENCOUNTER — Inpatient Hospital Stay (HOSPITAL_COMMUNITY): Payer: Medicare Other | Admitting: Physical Therapy

## 2018-10-17 DIAGNOSIS — R5381 Other malaise: Principal | ICD-10-CM

## 2018-10-17 DIAGNOSIS — I2699 Other pulmonary embolism without acute cor pulmonale: Secondary | ICD-10-CM

## 2018-10-17 LAB — COMPREHENSIVE METABOLIC PANEL
ALT: 53 U/L — ABNORMAL HIGH (ref 0–44)
AST: 44 U/L — ABNORMAL HIGH (ref 15–41)
Albumin: 2.5 g/dL — ABNORMAL LOW (ref 3.5–5.0)
Alkaline Phosphatase: 63 U/L (ref 38–126)
Anion gap: 8 (ref 5–15)
BUN: 13 mg/dL (ref 8–23)
CO2: 24 mmol/L (ref 22–32)
Calcium: 9.1 mg/dL (ref 8.9–10.3)
Chloride: 108 mmol/L (ref 98–111)
Creatinine, Ser: 0.88 mg/dL (ref 0.61–1.24)
GFR calc Af Amer: >60 mL/min (ref >60)
GFR calc non Af Amer: >60 mL/min (ref >60)
Glucose, Bld: 111 mg/dL — ABNORMAL HIGH (ref 70–99)
Potassium: 3.9 mmol/L (ref 3.5–5.1)
Sodium: 140 mmol/L (ref 135–145)
Total Bilirubin: 0.7 mg/dL (ref 0.3–1.2)
Total Protein: 6.2 g/dL — ABNORMAL LOW (ref 6.5–8.1)

## 2018-10-17 LAB — CBC WITH DIFFERENTIAL/PLATELET
Abs Immature Granulocytes: 0.03 10*3/uL (ref 0.00–0.07)
Basophils Absolute: 0 10*3/uL (ref 0.0–0.1)
Basophils Relative: 0 %
Eosinophils Absolute: 0.6 10*3/uL — ABNORMAL HIGH (ref 0.0–0.5)
Eosinophils Relative: 7 %
HCT: 37.9 % — ABNORMAL LOW (ref 39.0–52.0)
Hemoglobin: 12.3 g/dL — ABNORMAL LOW (ref 13.0–17.0)
Immature Granulocytes: 0 %
Lymphocytes Relative: 28 %
Lymphs Abs: 2.5 10*3/uL (ref 0.7–4.0)
MCH: 31.4 pg (ref 26.0–34.0)
MCHC: 32.5 g/dL (ref 30.0–36.0)
MCV: 96.7 fL (ref 80.0–100.0)
Monocytes Absolute: 0.8 10*3/uL (ref 0.1–1.0)
Monocytes Relative: 9 %
Neutro Abs: 5.2 10*3/uL (ref 1.7–7.7)
Neutrophils Relative %: 56 %
Platelets: 370 10*3/uL (ref 150–400)
RBC: 3.92 MIL/uL — ABNORMAL LOW (ref 4.22–5.81)
RDW: 12.8 % (ref 11.5–15.5)
WBC: 9.2 10*3/uL (ref 4.0–10.5)
nRBC: 0 % (ref 0.0–0.2)

## 2018-10-17 NOTE — Progress Notes (Signed)
[facility] PHYSICAL MEDICINE & REHABILITATION PROGRESS NOTE   Subjective/Complaints: Stomach rumbling , no abd pain.  Slept "fair"  ROS- neg CP, SOB, N/V/D   Objective:   No results found. Recent Labs    10/16/18 0430 10/17/18 0543  WBC 8.5 9.2  HGB 11.6* 12.3*  HCT 36.1* 37.9*  PLT 337 370   Recent Labs    10/16/18 0430 10/17/18 0543  NA 140 140  K 3.8 3.9  CL 109 108  CO2 21* 24  GLUCOSE 119* 111*  BUN 12 13  CREATININE 0.92 0.88  CALCIUM 9.0 9.1    Intake/Output Summary (Last 24 hours) at 10/17/2018 0904 Last data filed at 10/17/2018 [id number] Gross per 24 hour  Intake 360 ml  Output 850 ml  Net -490 ml     Physical Exam: Vital Signs Blood pressure 125/79, pulse 98, temperature 98 F (36.7 C), temperature source Oral, resp. rate 19, SpO2 99 %.   General: No acute distress Mood and affect are appropriate Heart: Regular rate and rhythm no rubs murmurs or extra sounds Lungs: Clear to auscultation, breathing unlabored, no rales or wheezes Abdomen: Positive bowel sounds, soft nontender to palpation, nondistended Extremities: No clubbing, cyanosis, or edema Skin: No evidence of breakdown, no evidence of rash Neurologic: Cranial nerves II through XII intact, motor strength is 4/5 in bilateral deltoid, bicep, tricep, grip, hip flexor, knee extensors, ankle dorsiflexor and plantar flexor Sensory exam normal sensation to light touchbilateral  lower extremities Cerebellar exam normal finger to nose to finger as well as heel to shin in bilateral upper and lower extremities Musculoskeletal: RIght 2nd toe amp, Right partial 5th toe amp No joint swelling   Assessment/Plan: 1. Functional deficits secondary to Debility related to PE which require 3+ hours per day of interdisciplinary therapy in a comprehensive inpatient rehab setting.  Physiatrist is providing close team supervision and 24 hour management of active medical problems listed below.  Physiatrist and rehab  team continue to assess barriers to discharge/monitor patient progress toward functional and medical goals  Care Tool:  Bathing              Bathing assist       Upper Body Dressing/Undressing Upper body dressing        Upper body assist      Lower Body Dressing/Undressing Lower body dressing            Lower body assist       Toileting Toileting    Toileting assist       Transfers Chair/bed transfer  Transfers assist           Locomotion Ambulation   Ambulation assist              Walk 10 feet activity   Assist           Walk 50 feet activity   Assist           Walk 150 feet activity   Assist           Walk 10 feet on uneven surface  activity   Assist           Wheelchair     Assist               Wheelchair 50 feet with 2 turns activity    Assist            Wheelchair 150 feet activity     Assist  Medical Problem List and Plan: 1.  Debility secondary to bilateral lower lobe pulmonary emboli/multi-medical            CIR PT, OT, evals 2.  Antithrombotics: Pulmonary emboli/left peroneal DVT: Eliquis             -antiplatelet therapy: N/A 3. Pain Management: Lidoderm patch, Zanaflex 2 mg every 8 hours as needed 4. Mood: Provide emotional support             -antipsychotic agents: N/A 5. Neuropsych: This patient is capable of making decisions on his own behalf. 6. Skin/Wound Care: Routine skin checks 7. Fluids/Electrolytes/Nutrition: Routine in and outs. Follow-up CMP tomorrow a.m. 8.  Hematuria as well as history of BPH with TURP.  Continue Flomax 0.4 mg daily, Avodart 0.5 mg daily.  Keep Foley tube for now.  Urology follow-up plan for potential voiding trial. 9.  Resting tremors.  Continue Inderal 10.  Hypertension.  Patient on no antihypertensive medications prior to admission. Monitor with increased mobility. Vitals:   10/16/18 1900 10/17/18 0547  BP: (!) 145/88  125/79  Pulse: 97 98  Resp: 17 19  Temp: 98 F (36.7 C) 98 F (36.7 C)  SpO2: 99% 99%   11.  Constipation.  Laxative assistance, monitor for incont    LOS: 1 days A FACE TO [place] E Tanessa Tidd 10/17/2018, 9:04 AM

## 2018-10-17 NOTE — Progress Notes (Signed)
Occupational Therapy Session Note  Patient Details  Name: [name] MRN: [id number] Date of Birth: 11-Sep-1940  Today's Date: 10/17/2018 OT Individual Time: 1500-1534 OT Individual Time Calculation (min): 34 min    Short Term Goals: Week 1:     Skilled Therapeutic Interventions/Progress Updates:    Patient in bed, ready for therapy session.  Talkative and cooperative t/o session.  Supine to/from SSP with min /mod A.  Sit to stand at edge of bed x 6 with CGA - able to tolerate standing for 30 -45 seconds at a time. He has difficulty with weight shifts due to right knee pain in stance.   Unsupported sitting with CS.  He returned to supine position in bed at close of session.  Call bell, blankets, tray table and phone in reach.  Bed alarm set.    Therapy Documentation Precautions:  Precautions Precautions: Fall Precaution Comments: Sore Lt ribs Restrictions Weight Bearing Restrictions: No General:   Vital Signs: Therapy Vitals Temp: 98.5 F (36.9 C) Pulse Rate: 96 Resp: 20 BP: 122/83 Patient Position (if appropriate): Sitting Oxygen Therapy SpO2: 96 % O2 Device: Room Air Pain: Pain Assessment Pain Scale: 0-10 Pain Score: 2  Pain Location: Flank Pain Orientation: Left Pain Descriptors / Indicators: Burning Pain Intervention(s): Repositioned Other Treatments:     Therapy/Group: Individual Therapy  Carlos Levering 10/17/2018, 3:40 PM
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
## 2018-10-17 NOTE — Progress Notes (Signed)
Urinary cath bag was noted having red urine with clots present during therapy. Bag was emptied and clear, yellow urine flowed from the catheter following. Will continue to monitor.
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
## 2018-10-17 NOTE — Progress Notes (Signed)
Inpatient Rehabilitation  Patient information reviewed and entered into eRehab system by Kadarius Cuffe M. Yakov Bergen, M.A., CCC/SLP, PPS Coordinator.  Information including medical coding, functional ability and quality indicators will be reviewed and updated through discharge.    

## 2018-10-18 ENCOUNTER — Inpatient Hospital Stay (HOSPITAL_COMMUNITY): Payer: Medicare Other | Admitting: Physical Therapy

## 2018-10-18 ENCOUNTER — Inpatient Hospital Stay (HOSPITAL_COMMUNITY): Payer: Medicare Other | Admitting: Occupational Therapy

## 2018-10-18 NOTE — Progress Notes (Signed)
[facility] Individual Statement of Services  Patient Name:  RANELL SKIBINSKI  Date:  10/18/2018  Welcome to the [facility].  Our goal is to provide you with an individualized program based on your diagnosis and situation, designed to meet your specific needs.  With this comprehensive rehabilitation program, you will be expected to participate in at least 3 hours of rehabilitation therapies Monday-Friday, with modified therapy programming on the weekends.  Your rehabilitation program will include the following services:  Physical Therapy (PT), Occupational Therapy (OT), 24 hour per day rehabilitation nursing, Neuropsychology, Case Management (Social Worker), Rehabilitation Medicine, Nutrition Services and Pharmacy Services  Weekly team conferences will be held on Wednesdays to discuss your progress.  Your Social Worker will talk with you frequently to get your input and to update you on team discussions.  Team conferences with you and your family in attendance may also be held.  Expected length of stay:  2 to 3 weeks  Overall anticipated outcome:  Supervision  Depending on your progress and recovery, your program may change. Your Social Worker will coordinate services and will keep you informed of any changes. Your Social Worker's name and contact numbers are listed  below.  The following services may also be recommended but are not provided by the [facility] will be made to provide these services after discharge if needed.  Arrangements include referral to agencies that provide these services.  Your insurance has been verified to be:  [employer] Your primary doctor is:  Dr. Crissie Sickles  Pertinent information will be shared with your doctor and your insurance company.  Social Worker:  Alfonse Alpers,  LCSW  [phone] or [phone]  Information discussed with and copy given to patient by: Trey Sailors, 10/18/2018, 11:01 PM

## 2018-10-18 NOTE — Progress Notes (Signed)
Occupational Therapy Session Note  Patient Details  Name: [name] MRN: [id number] Date of Birth: [birth year]  Today's Date: 10/18/2018 OT Individual Time: 1015-1130 and 1330-1429 OT Individual Time Calculation (min): 75 min and 59 min  Short Term Goals: Week 1:  OT Short Term Goal 1 (Week 1): Pt will complete bathing at sit > stand level mod assist OT Short Term Goal 2 (Week 1): Pt will complete LB dressing with mod assist at sit > stand level OT Short Term Goal 3 (Week 1): Pt will complete toilet transfer mod assist OT Short Term Goal 4 (Week 1): Pt will complete 1 grooming task in standing to demonstrate increased activity tolerance  Skilled Therapeutic Interventions/Progress Updates:    Pt greeted in w/c and premedicated for pain per report. Started session with extensive discussion regarding his ADL routine and home setup. He stated that he uses a mechanical lift chair to descend to basement where he showers. Discussed having a RW for both main level and basement so he can safely ambulate. Pt verbalized understanding. He uses a tub bench inside of his walk in shower, but will need a newer one post d/c. Already has a BSC, but he would like a hospital bed. Pt has a very specific routine, which his spouse will supervise when he goes home. 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 Discussed continuation of meaningful leisure activities at d/c for psychosocial wellness and maintenance of physical health. He then completed bathing/dressing sit<stand at sink. Pt wanted to complete oral care while seated vs standing due to knee pain. Sit<stand with Min A for OT to apply barrier cream to buttocks. He stated that he had already completed thorough perihygiene with PT during earlier session. OT washed feet and applied medicinal cream to his Rt toe. RN made aware. After handwashing, pt returned to bedside while sitting in w/c. He was left with all  needs within reach and safety belt fastened. Tx focus placed on activity tolerance, balance, and d/c planning.   2nd session 1:1 tx (59 min) Pt greeted in w/c with no c/o pain. Requesting to ambulate. Using RW, pt ambulated in a straight path towards window (~10 ft). Pt required Min A and w/c follow due to Rt knee instability. Vcs required for upright posture and forward gaze. He needed seated rest about every two feet. He was perseverating on his bed not being made up the way he wanted it. So we made the bed during session. He is very particular about the method his comforters are applied. Worked on dynamic w/c mobility during task with vcs for locking brakes at appropriate times. Afterwards pt completed short distance ambulation back to bed using RW with Min A once again. Pt returned to supine and was left with all needs within reach. Tx focus placed on activity tolerance, functional ambulation, and standing tolerance.   Therapy Documentation Precautions:  Precautions Precautions: Fall Precaution Comments: Sore Lt ribs Restrictions Weight Bearing Restrictions: No Pain: Pain Assessment Pain Scale: 0-10 Pain Score: 0-No pain ADL:       Therapy/Group: Individual Therapy  Kiaira Pointer A Krisandra Bueno 10/18/2018, 11:44 AM
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
## 2018-10-18 NOTE — IPOC Note (Signed)
Overall Plan of Care [facility](IPOC) Patient Details Name: Scharlene CornDudley I Rex MRN: [id number] DOB: 1940/11/10  Admitting Diagnosis: Debility  Hospital Problems: Principal Problem:   Debility     Functional Problem List: Nursing Bladder, Edema, Endurance, Medication Management, Pain, Safety, Sensory  PT Balance, Behavior, Edema, Endurance, Motor, Nutrition, Pain, Safety, Perception, Sensory  OT Balance, Endurance, Motor, Pain, Safety  SLP    TR         Basic ADL's: OT Grooming, Bathing, Dressing, Toileting     Advanced  ADL's: OT       Transfers: PT Bed Mobility, Bed to Chair, Car, Furniture, Civil Service fast [place]  OT Toilet, Research scientist (life sciences)Tub/Shower     Locomotion: PT Ambulation, Stairs, Psychologist, prison and probation servicesWheelchair Mobility     Additional Impairments: OT None  SLP        TR      Anticipated Outcomes Item Anticipated Outcome  Self Feeding    Swallowing      Basic self-care  Marketing executiveupervision  Toileting  Supervision   Bathroom Transfers Supervision  Bowel/Bladder  manage bladder  with equipment independently; bowel with mod i assist;  Transfers  Supervision assist with LRAD  Locomotion  Supervision assist with LRAD for short distances and mod I WC level mobility  Communication     Cognition     Pain  At or below level 4  Safety/Judgment  Maintain safety with cues and reminders   Therapy Plan: PT Intensity: Minimum of 1-2 x/day ,45 to 90 minutes PT Frequency: 5 out of 7 days PT Duration Estimated Length of Stay: 14-18 days w OT Intensity: Minimum of 1-2 x/day, 45 to 90 minutes OT Frequency: 5 out of 7 days OT Duration/Estimated Length of Stay: 18-21 days     Due to the current state of emergency, patients may not be receiving their 3-hours of Medicare-mandated therapy.   Team Interventions: Nursing Interventions Patient/Family Education, Bladder Management, Bowel Management, Discharge Planning, Pain Management, Medication Management, Disease Management/Prevention  PT interventions Ambulation/gait training,  Balance/vestibular training, Cognitive remediation/compensation, Community reintegration, Discharge planning, Disease management/prevention, DME/adaptive equipment instruction, Patient/family education, Pain management, Neuromuscular re-education, Psychosocial support, Functional mobility training, Skin care/wound management, Splinting/orthotics, Stair training, Therapeutic Exercise, Therapeutic Activities, Visual/perceptual remediation/compensation, Wheelchair propulsion/positioning, UE/LE Strength taining/ROM, UE/LE Coordination activities  OT Interventions Warden/rangerBalance/vestibular training, Community reintegration, Discharge planning, Disease mangement/prevention, Fish farm managerDME/adaptive equipment instruction, Functional mobility training, Neuromuscular re-education, Pain management, Patient/family education, Psychosocial support, Self Care/advanced ADL retraining, Therapeutic Activities, Therapeutic Exercise, UE/LE Strength taining/ROM, UE/LE Coordination activities  SLP Interventions    TR Interventions    SW/CM Interventions Discharge Planning, Psychosocial Support, Patient/Family Education   Barriers to Discharge MD  Medical stability  Nursing Lack of/limited family support    PT Decreased caregiver support, Inaccessible home environment    OT Decreased caregiver support, Home environment access/layout    SLP      SW       Team Discharge Planning: Destination: PT-Home ,OT- Home , SLP-  Projected Follow-up: PT-Home health PT, OT-  Home health OT, SLP-  Projected Equipment Needs: PT-Wheelchair (measurements), Wheelchair cushion (measurements), Rolling walker with 5" wheels, OT- Tub/shower bench, SLP-  Equipment Details: PT- , OT-  Patient/family involved in discharge planning: PT- Patient,  OT-Patient, SLP-   MD ELOS: 10-14d Medical Rehab Prognosis:  Good Assessment:   78 year old right-handed male with history of hypertension, BPH status post TURP, resting tremors, 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  Per chart  review and patient, lives with spouse.  2 level home with 2 steps to entry.  Independent with assistive  device.  Wife can provide only limited assistance.  Presented 10/10/2020 OSH with hematuria, constipation, shortness of breath, and weakness. Recent fall noted per family and debilitated with CT of the chest 09/24/2018 showing no definitive evidence of traumatic injury.  Imaging performed and he was found to have bilateral pulmonary emboli.  He was transferred to [facility] for further evaluation.  Review of CT Angio of chest showed bilateral lower lobe segmental pulmonary embolism associated with atelectasis.  He was started on heparin drip.  COVID negative.  Bladder scan noted more than 1800 cc of urine.  Lower extremity venous Doppler showed left peroneal DVT.  Urology consulted Dr. Alyson Ingles in regards to hematuria felt to be related to rapid decompression of his bladder.  Patient was placed on Flomax.  Plan is for urology follow-up for potential voiding trial while in the hospital. Patient was transitioned from heparin to Eliquis.  Close monitoring of any bleeding episodes.  Latest hemoglobin 11.6.    Now requiring 24/7 Rehab RN,MD, as well as CIR level PT, OT and SLP.  Treatment team will focus on ADLs and mobility with goals set at Supervision See Team Conference Notes for weekly updates to the plan of care

## 2018-10-18 NOTE — Progress Notes (Signed)
[place] PHYSICAL MEDICINE & REHABILITATION PROGRESS NOTE   Subjective/Complaints: Patient without new complaints today.  He tolerated therapy well yesterday Had a few clots in Foley bag yesterday ROS- neg CP, SOB, N/V/D   Objective:   No results found. Recent Labs    10/16/18 0430 10/17/18 0543  WBC 8.5 9.2  HGB 11.6* 12.3*  HCT 36.1* 37.9*  PLT 337 370   Recent Labs    10/16/18 0430 10/17/18 0543  NA 140 140  K 3.8 3.9  CL 109 108  CO2 21* 24  GLUCOSE 119* 111*  BUN 12 13  CREATININE 0.92 0.88  CALCIUM 9.0 9.1    Intake/Output Summary (Last 24 hours) at 10/18/2018 0921 Last data filed at 10/18/2018 0448 Gross per 24 hour  Intake 280 ml  Output 1375 ml  Net -1095 ml     Physical Exam: Vital Signs Blood pressure 106/69, pulse 91, temperature 97.6 F (36.4 C), resp. rate 18, SpO2 93 %.   General: No acute distress Mood and affect are appropriate Heart: Regular rate and rhythm no rubs murmurs or extra sounds Lungs: Clear to auscultation, breathing unlabored, no rales or wheezes Abdomen: Positive bowel sounds, soft nontender to palpation, nondistended Extremities: No clubbing, cyanosis, or edema Skin: No evidence of breakdown, no evidence of rash Neurologic: Cranial nerves II through XII intact, motor strength is 4/5 in bilateral deltoid, bicep, tricep, grip, hip flexor, knee extensors, ankle dorsiflexor and plantar flexor Sensory exam normal sensation to light touchbilateral  lower extremities Cerebellar exam normal finger to nose to finger as well as heel to shin in bilateral upper and lower extremities Musculoskeletal: RIght 2nd toe amp, Right partial 5th toe amp No joint swelling   Assessment/Plan: 1. Functional deficits secondary to Debility related to PE which require 3+ hours per day of interdisciplinary therapy in a comprehensive inpatient rehab setting.  Physiatrist is providing close team supervision and 24 hour management of active medical  problems listed below.  Physiatrist and rehab team continue to assess barriers to discharge/monitor patient progress toward functional and medical goals  Care Tool:  Bathing    Body parts bathed by patient: Right arm, Left arm, Chest, Abdomen, Front perineal area, Left upper leg, Right upper leg, Face   Body parts bathed by helper: Buttocks, Right lower leg, Left lower leg     Bathing assist Assist Level: Moderate Assistance - Patient 50 - 74%     Upper Body Dressing/Undressing Upper body dressing   What is the patient wearing?: Hospital gown only    Upper body assist Assist Level: Set up assist    Lower Body Dressing/Undressing Lower body dressing            Lower body assist       Toileting Toileting    Toileting assist Assist for toileting: Moderate Assistance - Patient 50 - 74%     Transfers Chair/bed transfer  Transfers assist     Chair/bed transfer assist level: Maximal Assistance - Patient 25 - 49%     Locomotion Ambulation   Ambulation assist      Assist level: 2 helpers Assistive device: Walker-rolling Max distance: 5   Walk 10 feet activity   Assist  Walk 10 feet activity did not occur: Safety/medical concerns        Walk 50 feet activity   Assist Walk 50 feet with 2 turns activity did not occur: Safety/medical concerns         Walk 150 feet activity  Assist Walk 150 feet activity did not occur: Safety/medical concerns         Walk 10 feet on uneven surface  activity   Assist Walk 10 feet on uneven surfaces activity did not occur: Safety/medical concerns         Wheelchair     Assist   Type of Wheelchair: Manual    Wheelchair assist level: Supervision/Verbal cueing Max wheelchair distance: 142ft    Wheelchair 50 feet with 2 turns activity    Assist        Assist Level: Supervision/Verbal cueing   Wheelchair 150 feet activity     Assist     Assist Level: Supervision/Verbal cueing     Medical Problem List and Plan: 1.  Debility secondary to bilateral lower lobe pulmonary emboli/multi-medical            CIR PT, OT, 2.  Antithrombotics: Pulmonary emboli/left peroneal DVT: Eliquis, hemoglobin improving             -antiplatelet therapy: N/A 3. Pain Management: Lidoderm patch, Zanaflex 2 mg every 8 hours as needed 4. Mood: Provide emotional support             -antipsychotic agents: N/A 5. Neuropsych: This patient is capable of making decisions on his own behalf. 6. Skin/Wound Care: Routine skin checks 7. Fluids/Electrolytes/Nutrition: Routine in and outs.  8.  Hematuria as well as history of BPH with TURP.  Continue Flomax 0.4 mg daily, Avodart 0.5 mg daily.  Keep Foley tube for now.  Urology follow-up plan for potential voiding trial. Monitor hemoglobin.  Thus far no appreciable blood loss 9.  Resting tremors.  Continue Inderal 10.  Hypertension.  Patient on no antihypertensive medications prior to admission. Monitor with increased mobility. Vitals:   10/17/18 2011 10/18/18 0443  BP: 102/72 106/69  Pulse: 87 91  Resp: 17 18  Temp: 98.8 F (37.1 C) 97.6 F (36.4 C)  SpO2: 93% 93%  controlled 6/26 11.  Constipation.  Laxative assistance, monitor for incont    LOS: 2 days A FACE TO FACE EVALUATION WAS PERFORMED  Charlett Blake 10/18/2018, 9:21 AM

## 2018-10-18 NOTE — Progress Notes (Signed)
Physical Therapy Session Note  Patient Details  Name: [name] MRN: [id number] Date of Birth: 1940/07/19  Today's Date: 10/18/2018 PT Individual Time: 0905-1005 PT Individual Time Calculation (min): 60 min   Short Term Goals: Week 1:  PT Short Term Goal 1 (Week 1): Pt will perform bed<>WC transfer with min assist PT Short Term Goal 2 (Week 1): Pt will ambulate 59f with min assist PT Short Term Goal 3 (Week 1): Pt will maintain standing balance x 2 minute with min assist  Skilled Therapeutic Interventions/Progress Updates:   Pt received supine in bed and agreeable to PT. Rolling R and L for hygiene due to incontinent bowel movement with supervision assist from PT and heavy use of bed rails. PT required to perform all perineal hygiene with pt in sidelying.  Supine>sit transfer with supervision assist on the Rand min cues for safety and awareness of foley catheter.  Once in sitting, MD present to perform assessed while pt performed sitting balance EOB. Pt then reports need for additional BM.   [facility] assist to power up into standing. Pt reports significant pain in the medial aspect of the R knee in standing, requiring additional support from PT. Sit<>stand x [facility] assist for PT to perofrm clothing management and hygiene. Gait in room x 180fwith min assist and RW. Cues for posture and step to gait pattern to limit pain in the R knee. Blocked practice Sit<>stand. X 5 with mod assist overall with cues for UE placement. Pt left sitting in WC with call bell in reach and all needs met.        Therapy Documentation Precautions:  Precautions Precautions: Fall Precaution Comments: Sore Lt ribs Restrictions Weight Bearing Restrictions: No    Pain: 4/10 R knee with movement. See MAR   Therapy/Group: Individual Therapy  AuLorie Phenix/26/2020, 10:32 AM

## 2018-10-19 ENCOUNTER — Inpatient Hospital Stay (HOSPITAL_COMMUNITY): Payer: Medicare Other | Admitting: Physical Therapy

## 2018-10-19 ENCOUNTER — Inpatient Hospital Stay (HOSPITAL_COMMUNITY): Payer: Medicare Other | Admitting: Occupational Therapy

## 2018-10-19 DIAGNOSIS — R310 Gross hematuria: Secondary | ICD-10-CM

## 2018-10-19 DIAGNOSIS — M170 Bilateral primary osteoarthritis of knee: Secondary | ICD-10-CM

## 2018-10-19 DIAGNOSIS — I10 Essential (primary) hypertension: Secondary | ICD-10-CM

## 2018-10-19 MED ORDER — DICLOFENAC SODIUM 1 % TD GEL
2.0000 g | Freq: Three times a day (TID) | TRANSDERMAL | Status: DC
  Administered 2018-10-19 – 2018-11-01 (×35): 2 g via TOPICAL
  Filled 2018-10-19: qty 100

## 2018-10-19 MED ORDER — LIDOCAINE 5 % EX OINT
TOPICAL_OINTMENT | Freq: Two times a day (BID) | CUTANEOUS | Status: DC
  Administered 2018-10-19 – 2018-10-24 (×11): via TOPICAL
  Administered 2018-10-24: 1 via TOPICAL
  Administered 2018-10-25 – 2018-10-27 (×5): via TOPICAL
  Administered 2018-10-27: 1 via TOPICAL
  Administered 2018-10-28 – 2018-11-01 (×10): via TOPICAL
  Filled 2018-10-19: qty 35.44

## 2018-10-19 NOTE — Progress Notes (Signed)
Physical Therapy Session Note  Patient Details  Name: [name] MRN: [id number] Date of Birth: 1941/02/15  Today's Date: 10/19/2018 PT Individual Time: 0800-0900 PT Individual Time Calculation (min): 60 min   Short Term Goals: Week 1:  PT Short Term Goal 1 (Week 1): Pt will perform bed<>WC transfer with min assist PT Short Term Goal 2 (Week 1): Pt will ambulate 60f with min assist PT Short Term Goal 3 (Week 1): Pt will maintain standing balance x 2 minute with min assist  Skilled Therapeutic Interventions/Progress Updates:   Pt received supine in bed and agreeable to PT. Supine>sit transfer with supervision assist on the R and min cues for proper use of bed rail. PT applied Voltaren gel to Bil knee to prevent pain and improve ROM, per pt request. Sitting EOB, PT assisted pt to don pants over feet. Min assist from elevated surface to standing. CGA for safety and balance while PT pulled pants to waist.  Ambulatory transfer to WC x 17fwith RW and min assist for safety. Pt donned shirt sitting in WC with increased time and supervision assist. Oral car at sink with  Set up assist from PT.   WC mobility x 16031fith supervision assist from PT with min cues for turning technique to reduce radius and improve safety.   Stair management training on 3inch step to ascend/descend x 2 with min assist and moderate cues for step to gait pattern, UE positioning, and posture.   Gait training with RW x 62f73fth min assist overall from PT. Step to, antalgic gait pattern on the R. Cues for posture and AD management for pain control in the R knee. Throughout treatment pt required min assist to stand from elevated bed and mod assist to stand from [facility] to R knee pain.   Patient returned to room and left sitting [facility] call bell in reach and all needs met.         Therapy Documentation Precautions:  Precautions Precautions: Fall Precaution Comments: Sore Lt ribs Restrictions Weight Bearing  Restrictions: No General:   Vital Signs: Therapy Vitals Temp: 98.3 F (36.8 C) Temp Source: Oral Pulse Rate: 88 Resp: 16 BP: 100/66 Patient Position (if appropriate): Lying Oxygen Therapy SpO2: 93 % O2 Device: Room Air Pain: Pain Assessment Pain Scale: 0-10 Pain Score: 0-No pain Mobility:   Locomotion :    Trunk/Postural Assessment :    Balance:   Exercises:   Other Treatments:      Therapy/Group: Individual Therapy  AustLorie Phenix7/2020, 9:30 AM

## 2018-10-19 NOTE — Progress Notes (Signed)
[place] PHYSICAL MEDICINE & REHABILITATION PROGRESS NOTE   Subjective/Complaints: Had ointments brought in, voltaren for knees and lidocaine for toe. Still some blood in urine  ROS: Patient denies fever, rash, sore throat, blurred vision, nausea, vomiting, diarrhea, cough, shortness of breath or chest pain,   headache, or mood change.    Objective:   No results found. Recent Labs    10/17/18 0543  WBC 9.2  HGB 12.3*  HCT 37.9*  PLT 370   Recent Labs    10/17/18 0543  NA 140  K 3.9  CL 108  CO2 24  GLUCOSE 111*  BUN 13  CREATININE 0.88  CALCIUM 9.1    Intake/Output Summary (Last 24 hours) at 10/19/2018 1142 Last data filed at 10/19/2018 0900 Gross per 24 hour  Intake 600 ml  Output 950 ml  Net -350 ml     Physical Exam: Vital Signs Blood pressure 100/66, pulse 88, temperature 98.3 F (36.8 C), temperature source Oral, resp. rate 16, weight 117.3 kg, SpO2 93 %.   Constitutional: No distress . Vital signs reviewed. HEENT: EOMI, oral membranes moist Neck: supple Cardiovascular: RRR without murmur. No JVD    Respiratory: CTA Bilaterally without wheezes or rales. Normal effort    GI: BS +, non-tender, non-distended  Extremities: No clubbing, cyanosis, or edema Uro: urine blood tinged Skin: No evidence of breakdown, no evidence of rash Neurologic: Cranial nerves II through XII intact, motor strength is 4/5 in bilateral deltoid, bicep, tricep, grip, hip flexor, knee extensors, ankle dorsiflexor and plantar flexor Sensory exam normal sensation to light touchbilateral  lower extremities Cerebellar exam normal finger to nose to finger as well as heel to shin in bilateral upper and lower extremities Musculoskeletal: Right 2nd toe amp, dressed, Right partial 5th toe amp No joint swelling, knees painful   Assessment/Plan: 1. Functional deficits secondary to Debility related to PE which require 3+ hours per day of interdisciplinary therapy in a comprehensive  inpatient rehab setting.  Physiatrist is providing close team supervision and 24 hour management of active medical problems listed below.  Physiatrist and rehab team continue to assess barriers to discharge/monitor patient progress toward functional and medical goals  Care Tool:  Bathing    Body parts bathed by patient: Right arm, Left arm, Chest, Abdomen, Front perineal area, Left upper leg, Right upper leg, Face   Body parts bathed by helper: Right lower leg, Left lower leg Body parts n/a: Buttocks   Bathing assist Assist Level: Minimal Assistance - Patient > 75%     Upper Body Dressing/Undressing Upper body dressing   What is the patient wearing?: Hospital gown only    Upper body assist Assist Level: Set up assist    Lower Body Dressing/Undressing Lower body dressing            Lower body assist       Toileting Toileting    Toileting assist Assist for toileting: Moderate Assistance - Patient 50 - 74%     Transfers Chair/bed transfer  Transfers assist     Chair/bed transfer assist level: Minimal Assistance - Patient > 75%     Locomotion Ambulation   Ambulation assist      Assist level: Minimal Assistance - Patient > 75% Assistive device: Walker-rolling Max distance: 20   Walk 10 feet activity   Assist  Walk 10 feet activity did not occur: Safety/medical concerns  Assist level: Minimal Assistance - Patient > 75% Assistive device: Walker-rolling   Walk 50 feet activity  Assist Walk 50 feet with 2 turns activity did not occur: Safety/medical concerns         Walk 150 feet activity   Assist Walk 150 feet activity did not occur: Safety/medical concerns         Walk 10 feet on uneven surface  activity   Assist Walk 10 feet on uneven surfaces activity did not occur: Safety/medical concerns         Wheelchair     Assist   Type of Wheelchair: Manual    Wheelchair assist level: Supervision/Verbal cueing Max  wheelchair distance: 160    Wheelchair 50 feet with 2 turns activity    Assist        Assist Level: Supervision/Verbal cueing   Wheelchair 150 feet activity     Assist     Assist Level: Supervision/Verbal cueing    Medical Problem List and Plan: 1.  Debility secondary to bilateral lower lobe pulmonary emboli/multi-medical            CIR PT, OT, 2.  Antithrombotics: Pulmonary emboli/left peroneal DVT: Eliquis, hemoglobin improving             -antiplatelet therapy: N/A 3. Pain Management: Lidoderm patch, Zanaflex 2 mg every 8 hours as needed  -voltaren gel for knees  -lidocaine ointment for right 2nd toe area 4. Mood: Provide emotional support             -antipsychotic agents: N/A 5. Neuropsych: This patient is capable of making decisions on his own behalf. 6. Skin/Wound Care: Routine skin checks 7. Fluids/Electrolytes/Nutrition: Routine in and outs.  8.  Hematuria as well as history of BPH with TURP.  Continue Flomax 0.4 mg daily, Avodart 0.5 mg daily.  Keep Foley tube for now.  Urology follow-up (outpt?) plan for potential voiding trial.  Urine with mild hematuria at present 9.  Resting tremors.  Continue Inderal 10.  Hypertension.  Patient on no antihypertensive medications prior to admission. Monitor with increased mobility. Vitals:   10/19/18 0540 10/19/18 0756  BP: (!) 89/64 100/66  Pulse:  88  Resp:    Temp:    SpO2:    controlled 6/27 11.  Constipation.  Laxative assistance, monitor for incont    LOS: 3 days A FACE TO FACE EVALUATION WAS PERFORMED  Meredith Staggers 10/19/2018, 11:42 AM

## 2018-10-19 NOTE — Progress Notes (Signed)
Occupational Therapy Session Note  Patient Details  Name: [name] MRN: [id number] Date of Birth: 05/25/1940  Today's Date: 10/19/2018 OT Individual Time: 1103-1202 and 1420-1534 OT Individual Time Calculation (min): 59 min and 74 min  Short Term Goals: Week 1:  OT Short Term Goal 1 (Week 1): Pt will complete bathing at sit > stand level mod assist OT Short Term Goal 2 (Week 1): Pt will complete LB dressing with mod assist at sit > stand level OT Short Term Goal 3 (Week 1): Pt will complete toilet transfer mod assist OT Short Term Goal 4 (Week 1): Pt will complete 1 grooming task in standing to demonstrate increased activity tolerance  Skilled Therapeutic Interventions/Progress Updates:    Pt greeted in w/c with no c/o pain. ADL needs met. Had pt doff and don gripper socks. He leaned forward and reported onset of Lt flank/rib pain. Unable to don Lt gripper sock due to pain. Initiated AE training, and provided pt with sock aide + extra wide sock aide. He had practice using both, as he reports he has spells of increased foot edema. With demonstrations and increased time, pt able to don personal diabetic socks with supervision. Introduced shoe horn so he could don his sneakers. Pt used it a little, but also bent forward to meet demands of task with supervision assist for balance. Discussed using shoe buttons or elastic laces, however pt adamant that he wants to keep his laces as they are. He expressed interest in having AE for home use. Discussed methods of purchase with pt verbalizing understanding. He also reported needing an elongated BSC for home. This was added to his POC. At end of session pt was left in w/c with all needs within reach and safety belt fastened. Tx focus placed on pt education and ADL retraining.   2nd Session 1:1 tx (74 min) Pt greeted in w/c with no c/o pain. Agreeable to attempt ambulation into bathroom. Sit<stand with steady assist using device when w/c was parked beside  bed. Pt reported his Rt knee was buckling too much and needed to sit back down. Guided pt through heel slides using towels and seated marches in prep for ambulation. He was very nervous about going over threshold, but made it into bathroom stepping with Lt foot first. Pt needed 3 seated rest breaks when walking from bed to bathroom doorway. Able to continue into bathroom and transfer to toilet with Min A, RW, and vcs. We celebrated after transfer! Per pt, "I didn't think I could do it." Then he reported he needed to have a BM. Sit<stand with steady assist using RW. OT assisted with clothing mgt due to pt needing B UE support for standing balance. Pt with continent void while seated. He stood for hygiene completion with the same assist. OT applied barrier cream and a new brief. Pt doffed his pants with Max A due to catheter. Stand pivot<w/c completed with Min A, and after handwashing/oral care in room w/c level, he ambulated short distance to bed using device. Supervision for sit<supine and pt was repositioned for comfort. Left him with all needs within reach and bed alarm set.   Also discussed IADLs at home. He reports spouse will take care of laundry and that they are looking into meals on wheels post d/c.   Therapy Documentation Precautions:  Precautions Precautions: Fall Precaution Comments: Sore Lt ribs Restrictions Weight Bearing Restrictions: No Vital Signs: Therapy Vitals Temp: 98 F (36.7 C) Temp Source: Oral Pulse Rate: 96  Resp: 16 BP: 121/66 Patient Position (if appropriate): Sitting Oxygen Therapy SpO2: 98 % O2 Device: Room Air ADL:     Therapy/Group: Individual Therapy   A  10/19/2018, 4:54 PM

## 2018-10-20 NOTE — Progress Notes (Signed)
[facility] PHYSICAL MEDICINE & REHABILITATION PROGRESS NOTE   Subjective/Complaints: No new issues. Had a good night. Appreciative of receiving ointments for knees, foot  ROS: Patient denies fever, rash, sore throat, blurred vision, nausea, vomiting, diarrhea, cough, shortness of breath or chest pain,  headache, or mood change.    Objective:   No results found. No results for input(s): WBC, HGB, HCT, PLT in the last 72 hours. No results for input(s): NA, K, CL, CO2, GLUCOSE, BUN, CREATININE, CALCIUM in the last 72 hours.  Intake/Output Summary (Last 24 hours) at 10/20/2018 1059 Last data filed at 10/20/2018 1058 Gross per 24 hour  Intake 600 ml  Output 1000 ml  Net -400 ml     Physical Exam: Vital Signs Blood pressure 102/70, pulse 84, temperature 98 F (36.7 C), resp. rate 16, weight 117.3 kg, SpO2 94 %.   Constitutional: No distress . Vital signs reviewed. HEENT: EOMI, oral membranes moist Neck: supple Cardiovascular: RRR without murmur. No JVD    Respiratory: CTA Bilaterally without wheezes or rales. 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 Functional deficits secondary to Debility related to PE which require 3+ hours per day of interdisciplinary therapy in a comprehensive inpatient rehab setting.  Physiatrist is providing close team supervision  and 24 hour management of active medical problems listed below.  Physiatrist and rehab team continue to assess barriers to discharge/monitor patient progress toward functional and medical goals  Care Tool:  Bathing    Body parts bathed by patient: Right arm, Left arm, Chest, Abdomen, Front perineal area, Left upper leg, Right upper leg, Face   Body parts bathed by helper: Right lower leg, Left lower leg Body parts n/a: Buttocks   Bathing assist Assist Level: Minimal Assistance - Patient > 75%     Upper Body Dressing/Undressing Upper body dressing   What is the patient wearing?: Hospital gown only    Upper body assist Assist Level: Set up assist    Lower Body Dressing/Undressing Lower body dressing            Lower body assist       Toileting Toileting    Toileting assist Assist for toileting: Moderate Assistance - Patient 50 - 74%     Transfers Chair/bed transfer  Transfers assist     Chair/bed transfer assist level: Minimal Assistance - Patient > 75%     Locomotion Ambulation   Ambulation assist      Assist level: Minimal Assistance - Patient > 75% Assistive device: Walker-rolling Max distance: 20   Walk 10 feet activity   Assist  Walk 10 feet activity did not occur: Safety/medical concerns  Assist level: Minimal Assistance - Patient > 75% Assistive device: Walker-rolling   Walk 50 feet activity   Assist Walk 50 feet with 2 turns activity did not occur: Safety/medical concerns         Walk 150 feet activity  Assist Walk 150 feet activity did not occur: Safety/medical concerns         Walk 10 feet on uneven surface  activity   Assist Walk 10 feet on uneven surfaces activity did not occur: Safety/medical concerns         Wheelchair     Assist   Type of Wheelchair: Manual    Wheelchair assist level: Supervision/Verbal cueing Max wheelchair distance: 160    Wheelchair 50 feet with 2 turns  activity    Assist        Assist Level: Supervision/Verbal cueing   Wheelchair 150 feet activity     Assist     Assist Level: Supervision/Verbal cueing    Medical Problem List and Plan: 1.  Debility secondary to bilateral lower lobe pulmonary emboli/multi-medical            CIR PT, OT, 2.  Antithrombotics: Pulmonary emboli/left peroneal DVT: Eliquis, hemoglobin improving             -antiplatelet therapy: N/A 3. Pain Management: Lidoderm patch, Zanaflex 2 mg every 8 hours as needed  -voltaren gel for knees  -lidocaine ointment for right 2nd toe area 4. Mood: Provide emotional support             -antipsychotic agents: N/A 5. Neuropsych: This patient is capable of making decisions on his own behalf. 6. Skin/Wound Care: Routine skin checks 7. Fluids/Electrolytes/Nutrition: Routine in and outs.  8.  Hematuria as well as history of BPH with TURP.  Continue Flomax 0.4 mg daily, Avodart 0.5 mg daily.  Keep Foley tube for now.  Urology follow-up (outpt?) plan for potential voiding trial.  Urine with mild hematuria ongoing 9.  Resting tremors.  Continue Inderal 10.  Hypertension.  Patient on no antihypertensive medications prior to admission. Monitor with increased mobility. Vitals:   10/20/18 0452 10/20/18 0800  BP: 97/67 102/70  Pulse: 82 84  Resp: 16   Temp: 98 F (36.7 C)   SpO2: 94%   controlled 6/28 11.  Constipation.  Laxative assistance, monitor for incont  -had bm today, incontinent    LOS: 4 days A FACE TO [place] 10/20/2018, 10:59 AM

## 2018-10-21 ENCOUNTER — Inpatient Hospital Stay (HOSPITAL_COMMUNITY): Payer: Medicare Other | Admitting: Occupational Therapy

## 2018-10-21 ENCOUNTER — Inpatient Hospital Stay (HOSPITAL_COMMUNITY): Payer: Medicare Other | Admitting: Physical Therapy

## 2018-10-21 DIAGNOSIS — D62 Acute posthemorrhagic anemia: Secondary | ICD-10-CM

## 2018-10-21 DIAGNOSIS — R7401 Elevation of levels of liver transaminase levels: Secondary | ICD-10-CM

## 2018-10-21 DIAGNOSIS — R740 Nonspecific elevation of levels of transaminase and lactic acid dehydrogenase [LDH]: Secondary | ICD-10-CM

## 2018-10-21 NOTE — Discharge Instructions (Addendum)
Inpatient Rehab Discharge Instructions  [name] Discharge date and time: No discharge date for patient encounter.   Activities/Precautions/ Functional Status: Activity: activity as tolerated Diet: Regular Wound Care: none needed Functional status:  ___ No restrictions     ___ Walk up steps independently ___ 24/7 supervision/assistance   ___ Walk up steps with assistance ___ Intermittent supervision/assistance  ___ Bathe/dress independently ___ Walk with walker     _x__ Bathe/dress with assistance ___ Walk Independently    ___ Shower independently ___ Walk with assistance    ___ Shower with assistance ___ No alcohol     ___ Return to work/school ________  Special Instructions: No driving smoking or alcohol    COMMUNITY REFERRALS UPON DISCHARGE:    Home Health:   PT, OT, RN  Agency:[facility]   Phone:[phone]   Date of last service:11/02/2018  Medical Equipment/Items Ordered:WHEELCHAIR, ROLLATOR, Vassie Moselle, [place]  Agency/Supplier:[facility]   [phone]    My questions have been answered and I understand these instructions. I will adhere to these goals and the provided educational materials after my discharge from the hospital.  Patient/Caregiver Signature _______________________________ Date __________  Clinician Signature _______________________________________ Date __________  Please bring this form and your medication list with you to all your follow-up doctor's appointments.    Information on my medicine - ELIQUIS (apixaban)  Why was Eliquis prescribed for you? Eliquis was prescribed to treat blood clots that may have been found in the veins of your legs (deep vein thrombosis) or in your lungs (pulmonary embolism) and to reduce the risk of them occurring again.  What do You need to know about Eliquis ? The starting dose is 10 mg (two 5 mg tablets) taken TWICE daily for the FIRST SEVEN (7) DAYS, then on 10/22/2018, the  dose is reduced to ONE 5 mg tablet taken TWICE daily.  Eliquis may be taken with or without food.   Try to take the dose about the same time in the morning and in the evening. If you have difficulty swallowing the tablet whole please discuss with your pharmacist how to take the medication safely.  Take Eliquis exactly as prescribed and DO NOT stop taking Eliquis without talking to the doctor who prescribed the medication.  Stopping may increase your risk of developing a new blood clot.  Refill your prescription before you run out.  After discharge, you should have regular check-up appointments with your healthcare provider that is prescribing your Eliquis.    What do you do if you miss a dose? If a dose of ELIQUIS is not taken at the scheduled time, take it as soon as possible on the same day and twice-daily administration should be resumed. The dose should not be doubled to make up for a missed dose.  Important Safety Information A possible side effect of Eliquis is bleeding. You should call your healthcare provider right away if you experience any of the following: ? Bleeding from an injury or your nose that does not stop. ? Unusual colored urine (red or dark brown) or unusual colored stools (red or black). ? Unusual bruising for unknown reasons. ? A serious fall or if you hit your head (even if there is no bleeding).  Some medicines may interact with Eliquis and might increase your risk of bleeding or clotting while on Eliquis. To help avoid this, consult your healthcare provider or pharmacist prior to using any new prescription or non-prescription medications, including herbals, vitamins, non-steroidal anti-inflammatory  drugs (NSAIDs) and supplements.  This website has more information on Eliquis (apixaban): http://www.eliquis.com/eliquis/home

## 2018-10-21 NOTE — Progress Notes (Signed)
Occupational Therapy Session Note  Patient Details  Name: [name] MRN: [id number] Date of Birth: [birth year]  Today's Date: 10/21/2018 OT Individual Time: 6962-9528 OT Individual Time Calculation (min): 74 min   Short Term Goals: Week 1:  OT Short Term Goal 1 (Week 1): Pt will complete bathing at sit > stand level mod assist OT Short Term Goal 2 (Week 1): Pt will complete LB dressing with mod assist at sit > stand level OT Short Term Goal 3 (Week 1): Pt will complete toilet transfer mod assist OT Short Term Goal 4 (Week 1): Pt will complete 1 grooming task in standing to demonstrate increased activity tolerance  Skilled Therapeutic Interventions/Progress Updates:    Pt greeted in w/c. He reported using medicinal cream this AM for managing Rt knee pain. Wanting to ambulate. He self propelled w/c to dayroom for UB strengthening. While in dayroom, pt completed 1 sit<stand with RW, however unable to take a step due to Rt knee instability. While seated, pt completed seated marches and heel glides with towels in prep for ambulation. Then he ambulated 30 ft using RW with Min A and vcs for forward gaze. Pt required 5 seated rest breaks to make it to 30 ft. He then self propelled w/c back to room. Mod A for stand pivot<toilet. Pt with continent BM while seated. Max A for clothing mgt and hygiene as pt required B UE support on stable surfaces to maintain standing balance. He then transferred back to w/c and completed hand hygiene with setup. Pt was left in w/c with all needs within reach and safety belt fastened. Tx focus placed on balance, functional ambulation, and functional transfers.   Therapy Documentation Precautions:  Precautions Precautions: Fall Precaution Comments: Sore Lt ribs Restrictions Weight Bearing Restrictions: No Vital Signs: Therapy Vitals Temp: 97.8 F (36.6 C) Pulse Rate: 98 Resp: 19 BP: 104/70 Patient Position (if appropriate): Sitting Oxygen Therapy SpO2: 96 % O2  Device: Room Air Pain: Pain Assessment Pain Scale: 0-10 Pain Score: 0-No pain ADL:  :     Therapy/Group: Individual Therapy  Levante Simones A Raini Tiley 10/21/2018, 4:35 PM

## 2018-10-21 NOTE — Plan of Care (Signed)
  Problem: Consults Goal: RH GENERAL PATIENT EDUCATION Description: See Patient Education module for education specifics. Outcome: Progressing   Problem: RH BOWEL ELIMINATION Goal: RH STG MANAGE BOWEL WITH ASSISTANCE Description: STG Manage Bowel with Mod I Assistance. Outcome: Progressing Goal: RH STG MANAGE BOWEL W/MEDICATION W/ASSISTANCE Description: STG Manage Bowel with Medication with Mod I Assistance. Outcome: Progressing   Problem: RH BLADDER ELIMINATION Goal: RH STG MANAGE BLADDER WITH MEDICATION WITH ASSISTANCE Description: STG Manage Bladder With Medication With Mod I Assistance. Outcome: Progressing Goal: RH STG MANAGE BLADDER WITH EQUIPMENT WITH ASSISTANCE Description: STG Manage Bladder With Equipment With No Assistance Outcome: Progressing   Problem: RH SKIN INTEGRITY Goal: RH STG MAINTAIN SKIN INTEGRITY WITH ASSISTANCE Description: STG Maintain Skin Integrity With Min Assistance. Outcome: Progressing Goal: RH STG ABLE TO PERFORM INCISION/WOUND CARE W/ASSISTANCE Description: STG Able To Perform Incision/Wound Care With Assistance. Outcome: Progressing   Problem: RH SAFETY Goal: RH STG ADHERE TO SAFETY PRECAUTIONS W/ASSISTANCE/DEVICE Description: STG Adhere to Safety Precautions With cues/reminders. Outcome: Progressing   Problem: RH PAIN MANAGEMENT Goal: RH STG PAIN MANAGED AT OR BELOW PT'S PAIN GOAL Outcome: Progressing   Problem: RH KNOWLEDGE DEFICIT GENERAL Goal: RH STG INCREASE KNOWLEDGE OF SELF CARE AFTER HOSPITALIZATION Outcome: Progressing   

## 2018-10-21 NOTE — Progress Notes (Signed)
Physical Therapy Session Note  Patient Details  Name: [name] MRN: [id number] Date of Birth: 1940/06/18  Today's Date: 10/21/2018 PT Individual Time: 0805-0900 PT Individual Time Calculation (min): 55 min   Short Term Goals: Week 1:  PT Short Term Goal 1 (Week 1): Pt will perform bed<>WC transfer with min assist PT Short Term Goal 2 (Week 1): Pt will ambulate 57ft with min assist PT Short Term Goal 3 (Week 1): Pt will maintain standing balance x 2 minute with min assist  Skilled Therapeutic Interventions/Progress Updates: Pt presented in bed agreeable to therapy. Pt indicated may have had incontinent bowl movement. Pt performed rolling to R with minA, PTA able to check brief and although not soiled did require a change. Performed supine to sit at EOB on R with CGA and increased time with use of bed features. PTA applied Voltaren gel to B knees per pt request and threaded pants total A for time management. Pt performed STS from elevated bed with increased time and CGA and PTA performed clothing management. Pt able to tolerate standing for approx 1 min before requiring to sit. Nsg arrived to administer meds with pt consuming at EOB. Once completed pt performed stand pivot transfer to w/c with CGA and use of RW. Pt transferred to hallway and ambulated x 5 ft with RW and CGA. Pt unable to complete additional ambulation due to increased pain in R knee and fatigue. Pt walked w/c x 36ft to return to room. Once in room pt left with belt alarm on, NT present, and pt set up to perform oral care.      Therapy Documentation Precautions:  Precautions Precautions: Fall Precaution Comments: Sore Lt ribs Restrictions Weight Bearing Restrictions: No General:      Therapy/Group: Individual Therapy  Bianey Tesoro  [facility], PTA  10/21/2018, 12:17 PM

## 2018-10-21 NOTE — Progress Notes (Signed)
Occupational Therapy Session Note  Patient Details  Name: [name] MRN: [id number] Date of Birth: 02-24-1941  Today's Date: 10/21/2018 OT Individual Time: 9381-0175 OT Individual Time Calculation (min): 55 min    Short Term Goals: Week 1:  OT Short Term Goal 1 (Week 1): Pt will complete bathing at sit > stand level mod assist OT Short Term Goal 2 (Week 1): Pt will complete LB dressing with mod assist at sit > stand level OT Short Term Goal 3 (Week 1): Pt will complete toilet transfer mod assist OT Short Term Goal 4 (Week 1): Pt will complete 1 grooming task in standing to demonstrate increased activity tolerance  Skilled Therapeutic Interventions/Progress Updates:    Upon entering the room, pt seated in wheelchair and awaiting OT arrival. Pt very talkative during session and needing mod cuing for redirection. Pt refusing to ambulate this session. He propelled wheelchair 100' to gym with supervision and increased time. Pt standing for 1 min x 2 reps for dynavision task with pt needing min guard for standing balance. Pt with no major LOB but very anxious about standing for 1 minute at a time. Pt needing rest break secondary to fatigue. Pt then reports need for BM. OT assisted pt back to room via wheelchair secondary to urgency. Squat pivot from wheelchair > elevated toilet with min A and use of grab bar. Pt needing assistance for clothing management. Pt having BM and requesting to remain seated for some time. NT notified and call bell within pt's reach.   Therapy Documentation Precautions:  Precautions Precautions: Fall Precaution Comments: Sore Lt ribs Restrictions Weight Bearing Restrictions: No   Therapy/Group: Individual Therapy  Gypsy Decant 10/21/2018, 11:44 AM

## 2018-10-21 NOTE — Progress Notes (Signed)
[place] PHYSICAL MEDICINE & REHABILITATION PROGRESS NOTE   Subjective/Complaints: Patient seen sitting up in bed this morning.  He states he slept well overnight.  He states he had some burning when he felt he was urinating this morning.  He is questions regarding blood clots and discharge and his wife's ability to provide assistance.  ROS: Denies CP, SOB, N/V/D   Objective:   No results found. No results for input(s): WBC, HGB, HCT, PLT in the last 72 hours. No results for input(s): NA, K, CL, CO2, GLUCOSE, BUN, CREATININE, CALCIUM in the last 72 hours.  Intake/Output Summary (Last 24 hours) at 10/21/2018 1301 Last data filed at 10/21/2018 0529 Gross per 24 hour  Intake 240 ml  Output 800 ml  Net -560 ml     Physical Exam: Vital Signs Blood pressure 96/62, pulse 80, temperature 98.5 F (36.9 C), temperature source Axillary, resp. rate 16, weight 117.3 kg, SpO2 93 %.  Constitutional: No distress . Vital signs reviewed. HENT: Normocephalic.  Atraumatic. Eyes: EOMI. No discharge. Cardiovascular: No JVD. Respiratory: Normal effort. GI: Non-distended. Musc: No edema or tenderness in extremities.  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  Intact. Neurologic: Alert and oriented Motor: 4-4+/5 grossly throughout   Assessment/Plan: 1. Functional deficits secondary to Debility related to PE which require 3+ hours per day of interdisciplinary therapy in a comprehensive inpatient rehab setting.  Physiatrist is providing close team supervision and 24 hour management of active medical problems listed below.  Physiatrist and rehab team continue to assess barriers to discharge/monitor patient progress toward functional and medical goals  Care Tool:  Bathing    Body parts bathed by patient: Right arm, Left arm, Chest, Abdomen, Face   Body parts bathed by helper: Buttocks, Front perineal area, Right lower leg, Left lower leg Body  parts n/a: Buttocks   Bathing assist Assist Level: Moderate Assistance - Patient 50 - 74%     Upper Body Dressing/Undressing Upper body dressing   What is the patient wearing?: Pull over shirt    Upper body assist Assist Level: Minimal Assistance - Patient > 75%    Lower Body Dressing/Undressing Lower body dressing      What is the patient wearing?: Pants     Lower body assist Assist for lower body dressing: Maximal Assistance - Patient 25 - 49%     Toileting Toileting    Toileting assist Assist for toileting: Maximal Assistance - Patient 25 - 49%     Transfers Chair/bed transfer  Transfers assist     Chair/bed transfer assist level: Maximal Assistance - Patient 25 - 49%     Locomotion Ambulation   Ambulation assist      Assist level: Minimal Assistance - Patient > 75% Assistive device: Walker-rolling Max distance: 20   Walk 10 feet activity   Assist  Walk 10 feet activity did not occur: Safety/medical concerns  Assist level: Minimal Assistance - Patient > 75% Assistive device: Walker-rolling   Walk 50 feet activity   Assist Walk 50 feet with 2 turns activity did not occur: Safety/medical concerns         Walk 150 feet activity   Assist Walk 150 feet activity did not occur: Safety/medical concerns         Walk 10 feet on uneven surface  activity   Assist Walk 10 feet on uneven surfaces activity did not occur: Safety/medical concerns         Wheelchair  Assist   Type of Wheelchair: Manual    Wheelchair assist level: Supervision/Verbal cueing Max wheelchair distance: 160    Wheelchair 50 feet with 2 turns activity    Assist        Assist Level: Supervision/Verbal cueing   Wheelchair 150 feet activity     Assist     Assist Level: Supervision/Verbal cueing    Medical Problem List and Plan: 1.  Debility secondary to bilateral lower lobe pulmonary emboli/multi-medical  Continue CIR  Weekend notes  reviewed- joint pain improved 2.  Antithrombotics: Pulmonary emboli/left peroneal DVT: Eliquis             -antiplatelet therapy: N/A 3. Pain Management: Lidoderm patch, Zanaflex 2 mg every 8 hours as needed  -voltaren gel for knees  -lidocaine ointment for right 2nd toe area 4. Mood: Provide emotional support             -antipsychotic agents: N/A 5. Neuropsych: This patient is capable of making decisions on his own behalf. 6. Skin/Wound Care: Routine skin checks 7. Fluids/Electrolytes/Nutrition: Routine in and outs.  8.  Hematuria as well as history of BPH with TURP.  Continue Flomax 0.4 mg daily, Avodart 0.5 mg daily.  Keep Foley tube for now.  Urology follow-up (outpt?) plan for potential voiding trial.  Will discuss with PA  Continues to have blood-tinged urine 9.  Resting tremors.  Continue Inderal 10.  Hypertension.  Patient on no antihypertensive medications prior to admission. Monitor with increased mobility. Vitals:   10/20/18 2048 10/21/18 0512  BP: 116/69 96/62  Pulse: 87 80  Resp: 16 16  Temp: 98 F (36.7 C) 98.5 F (36.9 C)  SpO2: 96% 93%   Controlled on 6/29 11.  Constipation.  Laxative assistance, monitor for incont  Improving 12.  Acute blood loss anemia  See #8  Hemoglobin 12.2 on 6/25, labs ordered for tomorrow 13.  Transaminitis  LFTs elevated on 6/25  Continue to monitor    LOS: 5 days A FACE TO FACE EVALUATION WAS PERFORMED  [name] [name] 10/21/2018, 1:01 PM

## 2018-10-22 ENCOUNTER — Inpatient Hospital Stay (HOSPITAL_COMMUNITY): Payer: Medicare Other | Admitting: Occupational Therapy

## 2018-10-22 ENCOUNTER — Inpatient Hospital Stay (HOSPITAL_COMMUNITY): Payer: Medicare Other | Admitting: Physical Therapy

## 2018-10-22 DIAGNOSIS — I2699 Other pulmonary embolism without acute cor pulmonale: Secondary | ICD-10-CM

## 2018-10-22 DIAGNOSIS — R319 Hematuria, unspecified: Secondary | ICD-10-CM

## 2018-10-22 DIAGNOSIS — K5901 Slow transit constipation: Secondary | ICD-10-CM

## 2018-10-22 LAB — CBC WITH DIFFERENTIAL/PLATELET
Abs Immature Granulocytes: 0.04 10*3/uL (ref 0.00–0.07)
Basophils Absolute: 0 10*3/uL (ref 0.0–0.1)
Basophils Relative: 0 %
Eosinophils Absolute: 0.6 10*3/uL — ABNORMAL HIGH (ref 0.0–0.5)
Eosinophils Relative: 8 %
HCT: 35.4 % — ABNORMAL LOW (ref 39.0–52.0)
Hemoglobin: 11.4 g/dL — ABNORMAL LOW (ref 13.0–17.0)
Immature Granulocytes: 1 %
Lymphocytes Relative: 41 %
Lymphs Abs: 3.1 10*3/uL (ref 0.7–4.0)
MCH: 31.8 pg (ref 26.0–34.0)
MCHC: 32.2 g/dL (ref 30.0–36.0)
MCV: 98.6 fL (ref 80.0–100.0)
Monocytes Absolute: 0.6 10*3/uL (ref 0.1–1.0)
Monocytes Relative: 8 %
Neutro Abs: 3.2 10*3/uL (ref 1.7–7.7)
Neutrophils Relative %: 42 %
Platelets: 298 10*3/uL (ref 150–400)
RBC: 3.59 MIL/uL — ABNORMAL LOW (ref 4.22–5.81)
RDW: 13 % (ref 11.5–15.5)
WBC: 7.6 10*3/uL (ref 4.0–10.5)
nRBC: 0 % (ref 0.0–0.2)

## 2018-10-22 NOTE — Progress Notes (Signed)
Occupational Therapy Session Note  Patient Details  Name: [name] MRN: [id number] Date of Birth: 1940/12/25  Today's Date: 10/22/2018 OT Individual Time: 1015-1050 OT Individual Time Calculation (min): 35 min    Short Term Goals: Week 1:  OT Short Term Goal 1 (Week 1): Pt will complete bathing at sit > stand level mod assist OT Short Term Goal 2 (Week 1): Pt will complete LB dressing with mod assist at sit > stand level OT Short Term Goal 3 (Week 1): Pt will complete toilet transfer mod assist OT Short Term Goal 4 (Week 1): Pt will complete 1 grooming task in standing to demonstrate increased activity tolerance  Skilled Therapeutic Interventions/Progress Updates:    1:1 Donned TEDS and shoes with max A - able to don nor fasten. Pt able to self propel with bilateral feet and UEs~ 50 feet. Transtioned to the gym and focus on exercises to "warm up" his LEs as he requested- short arc quad and knee flexion with heel slides. Pt performed sit to stands with min guard but with first sit to stand unable to tolerate standing for long reporting his "left knee was going to buckle." 3 more sit to stands before attempted to step up/ down a 2 inch step with bilateral hand rails. Performed 2x with min A with min VC for weight shifts.   Transitioned back to the room and performed min A stand pivot transfer to the toilet with A for clothing management. Left pt in nursing care.  Therapy Documentation Precautions:  Precautions Precautions: Fall Precaution Comments: Sore Lt ribs Restrictions Weight Bearing Restrictions: No Pain:  no c/o pain   Therapy/Group: Individual Therapy  Willeen Cass [facility] 10/22/2018, 2:51 PM

## 2018-10-22 NOTE — Progress Notes (Signed)
Physical Therapy Session Note  Patient Details  Name: [name] MRN: [id number] Date of Birth: [birth year]  Today's Date: 10/22/2018 PT Individual Time: 1120-1205 and 1435-1535 PT Individual Time Calculation (min): 45 min and 60 min  Short Term Goals: Week 1:  PT Short Term Goal 1 (Week 1): Pt will perform bed<>WC transfer with min assist PT Short Term Goal 2 (Week 1): Pt will ambulate 59f with min assist PT Short Term Goal 3 (Week 1): Pt will maintain standing balance x 2 minute with min assist  Skilled Therapeutic Interventions/Progress Updates: Tx1: Pt presented in w/c agreeable to therapy. Pt denies pain at rest, c/o rib pain with STS transfers a however Lidocaine patch present. Pt w/c "walked" to day room for hamstring strengthening and endurance. Pt encouraged to increase knee extension for maximum benefit. Participated in ambulation 134fand 2715fespectively with extended rest between bouts due to fatigue. Pt stating felt that R knee was unstable thus why terminated ambulation. PTA observd on second bout that pt ambulated with step through pattern and decreased antalgic gait. Continues to ambulate with uneven shortened step length. Pt was able to perform all STS from w/c with CGA and increased time this session. Pt "walked" w/c back to room and remained in w/c with belt alarm on and needs met.   Tx2: Pt presented in w/c agreeable to therapy. Pt transported to rehab gym for energy conservation. Performed stand pivot transfer to mat requiring CGA with increased time to stand with heavy use of BUE. Participated in STS from elevated mat with without use of AD. Pt initially with increased anxiety but able to perform with CGA. Pt then participated in LAQ x 15 bilaterally, hip flexion x 15 bilaterally, standing SLR x 5 bilaterally requiring seated rest and hamstring stretch on bench 1 min ea x 3. Pt transferred back to w/c in same manner as prior and pt transported back to room. Pt remained in  w/c at end of session with belt alarm on call bell within reach and needs met.      Therapy Documentation Precautions:  Precautions Precautions: Fall Precaution Comments: Sore Lt ribs Restrictions Weight Bearing Restrictions: No    Therapy/Group: Individual Therapy  Gabriell Casimir  Shigeru Lampert, PTA  10/22/2018, 12:46 PM

## 2018-10-22 NOTE — Progress Notes (Signed)
Occupational Therapy Session Note  Patient Details  Name: [name] MRN: [id number] Date of Birth: 07/15/1940  Today's Date: 10/22/2018 OT Individual Time: 3646-8032 OT Individual Time Calculation (min): 60 min    Short Term Goals: Week 1:  OT Short Term Goal 1 (Week 1): Pt will complete bathing at sit > stand level mod assist OT Short Term Goal 2 (Week 1): Pt will complete LB dressing with mod assist at sit > stand level OT Short Term Goal 3 (Week 1): Pt will complete toilet transfer mod assist OT Short Term Goal 4 (Week 1): Pt will complete 1 grooming task in standing to demonstrate increased activity tolerance  Skilled Therapeutic Interventions/Progress Updates:    Upon entering the room, pt supine in bed with no c/o pain. Pt required coaxing to exit the L side of bed as pt is very regimental in his activities. Pt completing supine >sit with supervision and requesting medication added to knees. Pt performed lateral scoot into wheelchair with min guard and OT setting up wheelchair. Pt refused shower but agreeable to sink bathing. Pt required mod A to thread clothing onto B feet and min A for sit <>stand. Pt able to pull over B hips with increased time and min guard. Set up A/supervision for UB self care and grooming at sink while seated. Pt remained in wheelchair with chair alarm activated and breakfast tray in front of him. Call bell within reach.   Therapy Documentation Precautions:  Precautions Precautions: Fall Precaution Comments: Sore Lt ribs Restrictions Weight Bearing Restrictions: No Other Treatments:     Therapy/Group: Individual Therapy  Gypsy Decant 10/22/2018, 9:19 AM

## 2018-10-22 NOTE — Plan of Care (Signed)
  Problem: Consults Goal: RH GENERAL PATIENT EDUCATION Description: See Patient Education module for education specifics. Outcome: Progressing   Problem: RH BOWEL ELIMINATION Goal: RH STG MANAGE BOWEL WITH ASSISTANCE Description: STG Manage Bowel with Mod I Assistance. Outcome: Progressing Goal: RH STG MANAGE BOWEL W/MEDICATION W/ASSISTANCE Description: STG Manage Bowel with Medication with Mod I Assistance. Outcome: Progressing   Problem: RH BLADDER ELIMINATION Goal: RH STG MANAGE BLADDER WITH MEDICATION WITH ASSISTANCE Description: STG Manage Bladder With Medication With Mod I Assistance. Outcome: Progressing Goal: RH STG MANAGE BLADDER WITH EQUIPMENT WITH ASSISTANCE Description: STG Manage Bladder With Equipment With No Assistance Outcome: Progressing   Problem: RH SKIN INTEGRITY Goal: RH STG MAINTAIN SKIN INTEGRITY WITH ASSISTANCE Description: STG Maintain Skin Integrity With [place]. Outcome: Progressing Goal: RH STG ABLE TO PERFORM INCISION/WOUND CARE W/ASSISTANCE Description: STG Able To Perform Incision/Wound Care With Assistance. Outcome: Progressing   Problem: RH SAFETY Goal: RH STG ADHERE TO SAFETY PRECAUTIONS W/ASSISTANCE/DEVICE Description: STG Adhere to Safety Precautions With cues/reminders. Outcome: Progressing   Problem: RH PAIN MANAGEMENT Goal: RH STG PAIN MANAGED AT OR BELOW PT'S PAIN GOAL Outcome: Progressing   Problem: RH KNOWLEDGE DEFICIT GENERAL Goal: RH STG INCREASE KNOWLEDGE OF SELF CARE AFTER HOSPITALIZATION Outcome: Progressing

## 2018-10-22 NOTE — Progress Notes (Signed)
[place] PHYSICAL MEDICINE & REHABILITATION PROGRESS NOTE   Subjective/Complaints: Patient seen sitting up in bed this morning.  He states he slept well overnight.  He states he feels better this morning.  He asks what he needs to do to be able to discharge safely.  ROS: Denies CP, SOB, N/V/D   Objective:   No results found. Recent Labs    10/22/18 0554  WBC 7.6  HGB 11.4*  HCT 35.4*  PLT 298   No results for input(s): NA, K, CL, CO2, GLUCOSE, BUN, CREATININE, CALCIUM in the last 72 hours.  Intake/Output Summary (Last 24 hours) at 10/22/2018 1118 Last data filed at 10/22/2018 0508 Gross per 24 hour  Intake 480 ml  Output 775 ml  Net -295 ml     Physical Exam: Vital Signs Blood pressure 95/66, pulse 86, temperature 98.2 F (36.8 C), resp. rate 16, weight 117.3 kg, SpO2 93 %.  Constitutional: No distress . Vital signs reviewed. HENT: Normocephalic.  Atraumatic. Eyes: EOMI.  No discharge. Cardiovascular: No JVD. Respiratory: Normal effort. GI: Non--distended. Musc: No edema or tenderness in extremities.  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  Intact. Neurologic: Alert and oriented Motor: 4+/5 grossly throughout   Assessment/Plan: 1. Functional deficits secondary to Debility related to PE which require 3+ hours per day of interdisciplinary therapy in a comprehensive inpatient rehab setting.  Physiatrist is providing close team supervision and 24 hour management of active medical problems listed below.  Physiatrist and rehab team continue to assess barriers to discharge/monitor patient progress toward functional and medical goals  Care Tool:  Bathing    Body parts bathed by patient: Face   Body parts bathed by helper: Buttocks, Front perineal area, Right lower leg, Left lower leg Body parts n/a: Buttocks   Bathing assist Assist Level: Set up assist     Upper Body  Dressing/Undressing Upper body dressing   What is the patient wearing?: Pull over shirt    Upper body assist Assist Level: Set up assist    Lower Body Dressing/Undressing Lower body dressing      What is the patient wearing?: Pants     Lower body assist Assist for lower body dressing: Moderate Assistance - Patient 50 - 74%     Toileting Toileting    Toileting assist Assist for toileting: Maximal Assistance - Patient 25 - 49%     Transfers Chair/bed transfer  Transfers assist     Chair/bed transfer assist level: Minimal Assistance - Patient > 75%     Locomotion Ambulation   Ambulation assist      Assist level: Minimal Assistance - Patient > 75% Assistive device: Walker-rolling Max distance: 20   Walk 10 feet activity   Assist  Walk 10 feet activity did not occur: Safety/medical concerns  Assist level: Minimal Assistance - Patient > 75% Assistive device: Walker-rolling   Walk 50 feet activity   Assist Walk 50 feet with 2 turns activity did not occur: Safety/medical concerns         Walk 150 feet activity   Assist Walk 150 feet activity did not occur: Safety/medical concerns         Walk 10 feet on uneven surface  activity   Assist Walk 10 feet on uneven surfaces activity did not occur: Safety/medical concerns         Wheelchair     Assist   Type of Wheelchair: Manual  Wheelchair assist level: Supervision/Verbal cueing Max wheelchair distance: 160    Wheelchair 50 feet with 2 turns activity    Assist        Assist Level: Supervision/Verbal cueing   Wheelchair 150 feet activity     Assist     Assist Level: Supervision/Verbal cueing    Medical Problem List and Plan: 1.  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  Antithrombotics: Pulmonary emboli/left peroneal DVT: Eliquis             -antiplatelet therapy: N/A 3. Pain Management: Lidoderm patch,  Zanaflex 2 mg every 8 hours as needed  -voltaren gel for knees  -lidocaine ointment for right 2nd toe area 4. Mood: Provide emotional support             -antipsychotic agents: N/A 5. Neuropsych: This patient is capable of making decisions on his own behalf. 6. Skin/Wound Care: Routine skin checks 7. Fluids/Electrolytes/Nutrition: Routine in and outs.  8.  Hematuria as well as history of BPH with TURP.  Continue Flomax 0.4 mg daily, Avodart 0.5 mg daily.    Keep Foley tube for now.  Urology follow-up (outpt?) plan for potential voiding trial.  Continues to have blood-tinged urine, although appears improved today 9.  Resting tremors.  Continue Inderal 10.  Hypertension.  Patient on no antihypertensive medications prior to admission. Monitor with increased mobility. Vitals:   10/22/18 0454 10/22/18 0456  BP:  95/66  Pulse: 84 86  Resp: 16   Temp: 98.2 F (36.8 C)   SpO2: 93% 93%   Controlled on 6/30 11.  Constipation.  Laxative assistance, monitor for incont  Improving 12.  Acute blood loss anemia  See #8  Hemoglobin 11.4 on 6/30 13.  Transaminitis  LFTs elevated on 6/25  Continue to monitor    LOS: 6 days A FACE TO FACE EVALUATION WAS PERFORMED  Iyanni Hepp Karis JubaAnil Yuna Pizzolato 10/22/2018, 11:18 AM

## 2018-10-23 ENCOUNTER — Inpatient Hospital Stay (HOSPITAL_COMMUNITY): Payer: Medicare Other

## 2018-10-23 ENCOUNTER — Inpatient Hospital Stay (HOSPITAL_COMMUNITY): Payer: Medicare Other | Admitting: Physical Therapy

## 2018-10-23 ENCOUNTER — Inpatient Hospital Stay (HOSPITAL_COMMUNITY): Payer: Medicare Other | Admitting: Occupational Therapy

## 2018-10-23 NOTE — Progress Notes (Signed)
Physical Therapy Session Note  Patient Details  Name: [name] MRN: [id number] Date of Birth: [birth year]  Today's Date: 10/23/2018  PT Individual Time: 1798-1025 AND 4862-8241 \PT Individual Time Calculation (min): 45 min and 70 min   Short Term Goals: Week 1:  PT Short Term Goal 1 (Week 1): Pt will perform bed<>WC transfer with min assist PT Short Term Goal 2 (Week 1): Pt will ambulate 50f with min assist PT Short Term Goal 3 (Week 1): Pt will maintain standing balance x 2 minute with min assist  Skilled Therapeutic Interventions/Progress Updates:     Session 1 Pt received supine in bed and agreeable to PT. Supine>sit transfer with supervision assist increased time due to knee pain. Sit<>stand from EOB with supervision assist for pt to don pants. Crepitus noted in Bil knee s with stand pivot to WC. Per pt request, Voltaren gel applied to bil knees.   [facility]<>stand x 4 with supervision assist and cues for proper UE placement. Standing marches 2 x 5 with pain in Bil knee R>L. HS curls on towel x 20 and Grade 2-3 joint mobs to reduce knee pain x 30sec each. Gait training with RW x 321fwith CGA from PT for safety. Pt reports knee weakness, but no instability noted by PT. [facility] returned [facility] and all needs met.      Session 2.   Pt received sitting in on toilet and agreeable to PT. Sit<>stand from toilet with supervision assist and hreavy use of rail. PT required to perform peri care and clothing management. Stand pivot with min assist and UE support on rail.    WC mobility through hall of rehab unit 2 x 20071fith supervision assist through doors and in tight spaces of room to back into corner.   Stand pivot transfer to mat table in day room with CGA from PT for safety and UE support on RW. Pt reports increase pain with WB in bil knees and requests Voltaren gel. PT applied to BLE at the knees. Sit<>stand  from EOM x 2 with supervisoin assist. Reciprocal marches to increase joint mobility 2 x 5.   Gait training with RW x 16f60fth CGA from PT. Mild L knee instability in last 10 ft. Cues for decreased step length to reduce pain knee and improve safety. With turn to sit in [facility]. [facility] pt in dynamic standing balance with 1 UE support while engaged in Wii  Sports. CGA-min assist from PT for safety. Cues for improved terminal knee extension as tolerated and for proper upright posture to prevent LBP. Pt able to maintain standing with 1-2 UE support for a total of 17 minutes, prior to requiring rest break.   [facility] returned to [facility] and [facility] reach and all needs met.       Therapy Documentation Precautions:  Precautions Precautions: Fall Precaution Comments: Sore Lt ribs Restrictions Weight Bearing Restrictions: No   Pain: Pain Assessment Pain Scale: 0-10 Pain Score: 0-No pain a trest     Therapy/Group: Individual Therapy  AustLorie Phenix/2020, 10:48 AM

## 2018-10-23 NOTE — Progress Notes (Signed)
Occupational Therapy Session Note  Patient Details  Name: [name] MRN: [id number] Date of Birth: 1940-05-30  Today's Date: 10/23/2018 OT Individual Time: 1105-1200 OT Individual Time Calculation (min): 55 min    Short Term Goals: Week 1:  OT Short Term Goal 1 (Week 1): Pt will complete bathing at sit > stand level mod assist OT Short Term Goal 2 (Week 1): Pt will complete LB dressing with mod assist at sit > stand level OT Short Term Goal 3 (Week 1): Pt will complete toilet transfer mod assist OT Short Term Goal 4 (Week 1): Pt will complete 1 grooming task in standing to demonstrate increased activity tolerance  Skilled Therapeutic Interventions/Progress Updates:    Upon entering the room, pt meeting therapist in hallway in wheelchair. OT notifying pt of discharge date of 7/11. Pt with several questions and concerns regarding needed equipment at discharge. Pt propelled wheelchair to ADL apartment with B UEs and increased time. Pt set up wheelchair and standing with mod A initially. Pt sitting back down and then standing again with min A and cuing from therapist about technique. Pt side stepping to bed with RW and min A. Pt demonstrated sit <>supine with min verbal cuing but pt performing at supervision level. Pt needing increased time to complete all tasks this session. Pt verbalized home set up and pt verbalizing need for shower chair at home as TTB will not fit. Plans to address step over threshold in next session. Pt returning to wheelchair in same manner and propelling wheelchair back to room in same manner as above. Call bell and all needed items within reach.   Therapy Documentation Precautions:  Precautions Precautions: Fall Precaution Comments: Sore Lt ribs Restrictions Weight Bearing Restrictions: No   Pain: Pain Assessment Pain Scale: 0-10 Pain Score: 0-No pain   Therapy/Group: Individual Therapy  Gypsy Decant 10/23/2018, 12:35 PM

## 2018-10-23 NOTE — Progress Notes (Signed)
[place] PHYSICAL MEDICINE & REHABILITATION PROGRESS NOTE   Subjective/Complaints:   Patient with urge to have BM.  States that his urine is draining clear this morning ROS: Denies CP, SOB, N/V/D   Objective:   No results found. Recent Labs    10/22/18 0554  WBC 7.6  HGB 11.4*  HCT 35.4*  PLT 298   No results for input(s): NA, K, CL, CO2, GLUCOSE, BUN, CREATININE, CALCIUM in the last 72 hours.  Intake/Output Summary (Last 24 hours) at 10/23/2018 0837 Last data filed at 10/22/2018 2000 Gross per 24 hour  Intake 800 ml  Output 625 ml  Net 175 ml     Physical Exam: Vital Signs Blood pressure (!) 115/59, pulse 84, temperature 98.3 F (36.8 C), resp. rate 17, weight 117.3 kg, SpO2 96 %.  Constitutional: No distress . Vital signs reviewed. HENT: Normocephalic.  Atraumatic. Eyes: EOMI.  No discharge. Cardiovascular: No JVD. Respiratory: Normal effort. GI: Non--distended. Musc: No edema or tenderness in extremities.  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  Intact. Neurologic: Alert and oriented Motor: 4+/5 grossly throughout   Assessment/Plan: 1. Functional deficits secondary to Debility related to PE which require 3+ hours per day of interdisciplinary therapy in a comprehensive inpatient rehab setting.  Physiatrist is providing close team supervision and 24 hour management of active medical problems listed below.  Physiatrist and rehab team continue to assess barriers to discharge/monitor patient progress toward functional and medical goals  Care Tool:  Bathing    Body parts bathed by patient: Face   Body parts bathed by helper: Buttocks, Front perineal area, Right lower leg, Left lower leg Body parts n/a: Buttocks   Bathing assist Assist Level: Set up assist     Upper Body Dressing/Undressing Upper body dressing   What is the patient wearing?: Pull over shirt    Upper body assist Assist  Level: Set up assist    Lower Body Dressing/Undressing Lower body dressing      What is the patient wearing?: Pants     Lower body assist Assist for lower body dressing: Moderate Assistance - Patient 50 - 74%     Toileting Toileting    Toileting assist Assist for toileting: Maximal Assistance - Patient 25 - 49%     Transfers Chair/bed transfer  Transfers assist     Chair/bed transfer assist level: Minimal Assistance - Patient > 75%     Locomotion Ambulation   Ambulation assist      Assist level: Contact Guard/Touching assist Assistive device: Walker-rolling Max distance: 53f   Walk 10 feet activity   Assist  Walk 10 feet activity did not occur: Safety/medical concerns  Assist level: Contact Guard/Touching assist Assistive device: Walker-rolling   Walk 50 feet activity   Assist Walk 50 feet with 2 turns activity did not occur: Safety/medical concerns         Walk 150 feet activity   Assist Walk 150 feet activity did not occur: Safety/medical concerns         Walk 10 feet on uneven surface  activity   Assist Walk 10 feet on uneven surfaces activity did not occur: Safety/medical concerns         Wheelchair     Assist   Type of Wheelchair: Manual    Wheelchair assist level: Supervision/Verbal cueing Max wheelchair distance: 160    Wheelchair 50 feet with 2 turns activity    Assist  Assist Level: Supervision/Verbal cueing   Wheelchair 150 feet activity     Assist     Assist Level: Supervision/Verbal cueing    Medical Problem List and Plan: 1.  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 Formulized individual treatment plan based on medical history, underlying problem and comorbidities. 2.   Antithrombotics: Pulmonary emboli/left peroneal DVT: Eliquis             -antiplatelet therapy: N/A 3. Pain Management: Lidoderm patch, Zanaflex 2 mg every 8 hours as needed  -voltaren gel for knees  -lidocaine ointment for right 2nd toe area 4. Mood: Provide emotional support             -antipsychotic agents: N/A 5. Neuropsych: This patient is capable of making decisions on his own behalf. 6. Skin/Wound Care: Routine skin checks 7. Fluids/Electrolytes/Nutrition: Routine in and outs.  8.  Hematuria as well as history of BPH with TURP.  Continue Flomax 0.4 mg daily, Avodart 0.5 mg daily.    Keep Foley tube for now.  Urology follow-up (outpt?) plan for potential voiding trial.  Continues to have blood-tinged urine, no clots 9.  Resting tremors.  Continue Inderal 10.  Hypertension.  Patient on no antihypertensive medications prior to admission. Monitor with increased mobility. Vitals:   10/22/18 2000 10/23/18 0512  BP: 108/81 (!) 115/59  Pulse: 92 84  Resp: 15 17  Temp: 98 F (36.7 C) 98.3 F (36.8 C)  SpO2: 95% 96%   Controlled on 7/1 11.  Constipation.  Laxative assistance, monitor for incont  Improving 12.  Acute blood loss anemia  See #8  Hemoglobin 11.4 on 6/30 13.  Transaminitis, may be elevated due to statin monitor  LFTs elevated on 6/25     LOS: 7 days A FACE TO [place] E Marlies Ligman 10/23/2018, 8:37 AM

## 2018-10-23 NOTE — Progress Notes (Signed)
Patient reports that he has had difficulty voiding PTA and was in process of being set up for TURP by Dr. Junious Silk. Discussed current issues with Dr. Worthy Flank who recommended keeping foley in till follow up in office. No concern about intermittent bleeding as long as catheter is draining without difficulty. H/H stable

## 2018-10-24 ENCOUNTER — Inpatient Hospital Stay (HOSPITAL_COMMUNITY): Payer: Medicare Other | Admitting: Physical Therapy

## 2018-10-24 ENCOUNTER — Inpatient Hospital Stay (HOSPITAL_COMMUNITY): Payer: Medicare Other | Admitting: Occupational Therapy

## 2018-10-24 NOTE — Patient Care Conference (Signed)
Inpatient RehabilitationTeam Conference and Plan of Care Update Date: 10/23/2018   Time: 10:45 AM    Patient Name: [name]      Medical Record Number: [id number]  Date of Birth: [birth year] Sex: Male         Room/Bed: 4W24C/4W24C-01 Payor Info: Payor: [place] / Plan: [facility] MEDICARE / Product Type: *No Product type* /    Admitting Diagnosis: 3. CVA 1 Team  Debility, malaise, 13-16 days  Admit Date/Time:  10/16/2018  4:42 PM Admission Comments: No comment available   Primary Diagnosis:  Debility Principal Problem: Debility  Patient Active Problem List   Diagnosis Date Noted  . Pulmonary embolus ([place])   . Transaminitis   . Acute blood loss anemia   . Debility 10/16/2018  . Benign prostatic hyperplasia with urinary retention   . Resting tremor   . Slow transit constipation   . Generalized edema   . 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 Hyponatremia 10/11/2018  . Hematuria 10/11/2018  . Hypertension   . Tremors of nervous system   . Vertigo   . Thyroid nodule   . Arrhythmia   . TIA (transient ischemic attack) 03/15/2018  . Onychomycosis 12/05/2016  . Skin ulcer of second toe of right foot, limited to breakdown of skin ([place]) 12/05/2016    Expected Discharge Date: Expected Discharge Date: 11/01/18  Team Members Present: Physician leading conference: Dr. Alysia Penna Social Worker Present: Alfonse Alpers, LCSW Nurse Present: Other (comment)(Jeanna [place], RN) PT Present: Barrie Folk, PT OT Present: Darleen Crocker, OT SLP Present: Stormy Fabian, SLP PPS Coordinator present : Gunnar Fusi, SLP     Current Status/Progress Goal Weekly Team Focus  Medical   Patient has chronic Foley, no bowel incontinence.  Hematuria improving  Maintain medical stability reduce fall risk reduce readmission risk  Continue rehabilitation program, initiate discharge planning   Bowel/Bladder   continent of bowel, uretheral catheter in place; LBM: 06/30   remain continent of bowel; maintain regular bowel pattern  assist with tolieting needs prn   Swallow/Nutrition/ Hydration             ADL's   Supervision/set up for UB self care, min A for functional transfers with RW, mod A LB self care  mod I for grooming, supervision overall  endurance, self care retraining, balance, functional mobility/transfers, education   Mobility   minA bed mobility with bed features, CGA STS transfers with heavy use of BUE, gait up to 72ft with RW CGA antalgic step through pattern, decreased standing tolerance  supervision assist  standing tolerance, BLE strengthening, balance, gait d/c planning   Communication             Safety/Cognition/ Behavioral Observations            Pain   c/o pain to bil LE; PRN tylenol and zanaflex  pain level <3/10  assess pain level QS and prn   Skin   non-pressure wound to R toe; lidocaine to toe per orders  remain free of new skin breakdown/infection  assess skin QS and prn    Rehab Goals Patient on target to meet rehab goals: Yes Rehab Goals Revised: none *See Care Plan and progress notes for long and short-term goals.     Barriers to Discharge  Current Status/Progress Possible Resolutions Date Resolved   Physician    Medical stability     Progressing towards goals  Continue rehabilitation program      Nursing  PT                    OT                  SLP                SW                Discharge Planning/Teaching Needs:  Pt plans to return to his home where his wife will be with him, but she can only provide supervision.  Pt is independent to direct any needed care.   Team Discussion:  Pt still has a foley and Dr. Wynn BankerKirsteins is still deciding if pt will have a voiding trial here vs. seeing urology as an outpt.  Foley has blood tinged urine in it with small blood clots.  Pt is taking tylenol, zanaflex, voltaren gel and these are controlling pain.  Pt's right knee hurts and pt has wound care  needs.  Pt is regimented and can be argumentative and doesn't want to try new things or new ways to do things.  OT is having to beg pt to ambulate.  He is S for UB tasks and mod A for LB.  Therapists have to redirect pt due to talking in sessions.  Pt has S goals overall.  Pt is S for bed mobility and CGA for stand pivot txs and needs more time due to R knee pain.  Pt can walk 40' CGA/min A with rolling walker and is S for w/c mobility.  Pt has S level goals for txs and gait, with mod I goals in w/c.  Revisions to Treatment Plan:  none    Continued Need for Acute Rehabilitation Level of Care: The patient requires daily medical management by a physician with specialized training in physical medicine and rehabilitation for the following conditions: Daily direction of a multidisciplinary physical rehabilitation program to ensure safe treatment while eliciting the highest outcome that is of practical value to the patient.: Yes Daily medical management of patient stability for increased activity during participation in an intensive rehabilitation regime.: Yes Daily analysis of laboratory values and/or radiology reports with any subsequent need for medication adjustment of medical intervention for : Pulmonary problems;Other   I attest that I was present, lead the team conference, and concur with the assessment and plan of the team.Team conference was held via web/ teleconference due to COVID - 19.   Riana Tessmer, Vista DeckJennifer Capps 10/24/2018, 12:44 AM

## 2018-10-24 NOTE — Progress Notes (Signed)
Social Work Patient ID: [name], male   DOB: [birth year], 78 y.o.   MRN: [id number]   CSW met with pt to update him on team conference discussion and about targeted date of 11-02-18.  CSW also talked with pt's wife via telephone to update her as well.  Pt's friend will come to pick him up on Saturday with DME.  CSW will arrange [facility] and assist with any other needs.

## 2018-10-24 NOTE — Progress Notes (Signed)
Physical Therapy Weekly Progress Note  Patient Details  Name: [name] MRN: [id number] Date of Birth: January 08, 1941  Beginning of progress report period: October 17, 2018 End of progress report period: October 24, 2018  Today's Date: 10/24/2018 PT Individual Time: 1130-1230 PT Individual Time Calculation (min): 60 min   Patient has met 3 of 3 short term goals.  Pt is making slow progress towards long term goals. Pain in Bil knees and generalized weakness limits increased increased independence and safety at this time. Pt currently functioning at a CGA-min assist level for bed mobility, transfers, and ambulation up to 29f. Supervision-mod I WC mobility with BUE and BLE.   Patient continues to demonstrate the following deficits muscle weakness and muscle joint tightness, decreased cardiorespiratoy endurance and decreased standing balance and decreased balance strategies and therefore will continue to benefit from skilled PT intervention to increase functional independence with mobility.  Patient progressing toward long term goals..  Continue plan of care.  PT Short Term Goals Week 1:  PT Short Term Goal 1 (Week 1): Pt will perform bed<>WC transfer with min assist PT Short Term Goal 1 - Progress (Week 1): Met PT Short Term Goal 2 (Week 1): Pt will ambulate 527fwith min assist PT Short Term Goal 2 - Progress (Week 1): Met PT Short Term Goal 3 (Week 1): Pt will maintain standing balance x 2 minute with min assist PT Short Term Goal 3 - Progress (Week 1): Met Week 2:  PT Short Term Goal 1 (Week 2): STG=LTG due to ELOS  Skilled Therapeutic Interventions/Progress Updates:   Pt received sitting in WC and agreeable to PT.  WC Mobility x [id number] supervision assist from and min cues for safety through tight spaces. Stand pivot transfers with supervision assist to and from [facility] for set up. Nustep reciprocal movement and endurance trainnig 2 x 5 min level 5>7. Cues for full ROM, improved  speed and sitting posture intermittently.     Gait training with RW x [id number] supervision-CGA from PT for safety. One instances of antalgic gait pattern with pain in the L knee at end of gait training 4/10. Pain subsides with rest following gait training.    Ascend>descend 2 steps with Bil rails and min assist for safety. . No reports of pain with stair management. Patient returned to room and [facility] in reach and all needs met.          Therapy Documentation Precautions:  Precautions Precautions: Fall Precaution Comments: Sore Lt ribs Restrictions Weight Bearing Restrictions: No Pain: denies at rest.   Therapy/Group: Individual Therapy  AustLorie Phenix/2020, 3:01 PM

## 2018-10-24 NOTE — Progress Notes (Signed)
  Patient ID: [name], male   DOB: 12-15-1940, 78 y.o.   MRN: [id number]      Diagnosis codes:  R53.81; I26.99; I82.452  Height:      6'3"   Weight:      259 lbs      Patient suffers from debility following pulmonary embolism which impairs his ability to perform daily activities like bathing, dressing, grooming, toileting, feeding in the home.  A walker or cane will not resolve issue with performing activities of daily living.  A wheelchair will allow patient to safely perform daily activities.  Patient is not able to propel himself in the home using a standard weight wheelchair due to general weakness and low endurance.  Patient can self propel in the lightweight wheelchair.
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
## 2018-10-24 NOTE — Plan of Care (Signed)
  Problem: Consults Goal: RH GENERAL PATIENT EDUCATION Description: See Patient Education module for education specifics. Outcome: Progressing   Problem: RH BOWEL ELIMINATION Goal: RH STG MANAGE BOWEL WITH ASSISTANCE Description: STG Manage Bowel with Mod I Assistance. Outcome: Progressing Goal: RH STG MANAGE BOWEL W/MEDICATION W/ASSISTANCE Description: STG Manage Bowel with Medication with Mod I Assistance. Outcome: Progressing   Problem: RH BLADDER ELIMINATION Goal: RH STG MANAGE BLADDER WITH MEDICATION WITH ASSISTANCE Description: STG Manage Bladder With Medication With Mod I Assistance. Outcome: Progressing Goal: RH STG MANAGE BLADDER WITH EQUIPMENT WITH ASSISTANCE Description: STG Manage Bladder With Equipment With No Assistance Outcome: Progressing   Problem: RH SKIN INTEGRITY Goal: RH STG MAINTAIN SKIN INTEGRITY WITH ASSISTANCE Description: STG Maintain Skin Integrity With Min Assistance. Outcome: Progressing Goal: RH STG ABLE TO PERFORM INCISION/WOUND CARE W/ASSISTANCE Description: STG Able To Perform Incision/Wound Care With Assistance. Outcome: Progressing   Problem: RH SAFETY Goal: RH STG ADHERE TO SAFETY PRECAUTIONS W/ASSISTANCE/DEVICE Description: STG Adhere to Safety Precautions With cues/reminders. Outcome: Progressing   Problem: RH PAIN MANAGEMENT Goal: RH STG PAIN MANAGED AT OR BELOW PT'S PAIN GOAL Outcome: Progressing   Problem: RH KNOWLEDGE DEFICIT GENERAL Goal: RH STG INCREASE KNOWLEDGE OF SELF CARE AFTER HOSPITALIZATION Outcome: Progressing   

## 2018-10-25 ENCOUNTER — Inpatient Hospital Stay (HOSPITAL_COMMUNITY): Payer: Medicare Other | Admitting: Occupational Therapy

## 2018-10-25 ENCOUNTER — Inpatient Hospital Stay (HOSPITAL_COMMUNITY): Payer: Medicare Other | Admitting: Physical Therapy

## 2018-10-25 LAB — URINALYSIS, ROUTINE W REFLEX MICROSCOPIC
Bilirubin Urine: NEGATIVE
Glucose, UA: NEGATIVE mg/dL
Ketones, ur: NEGATIVE mg/dL
Nitrite: NEGATIVE
Protein, ur: 100 mg/dL — AB
RBC / HPF: >50 RBC/hpf — ABNORMAL HIGH (ref 0–5)
Specific Gravity, Urine: 1.013 (ref 1.005–1.030)
pH: 5 (ref 5.0–8.0)

## 2018-10-25 MED ORDER — PHENAZOPYRIDINE HCL 100 MG PO TABS
100.0000 mg | ORAL_TABLET | Freq: Three times a day (TID) | ORAL | Status: AC
  Administered 2018-10-25 – 2018-10-27 (×6): 100 mg via ORAL
  Filled 2018-10-25 (×6): qty 1

## 2018-10-25 MED ORDER — LIDOCAINE HCL URETHRAL/MUCOSAL 2 % EX GEL
CUTANEOUS | Status: DC | PRN
  Filled 2018-10-25: qty 5
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
## 2018-10-25 NOTE — Significant Event (Signed)
Primary RN called me to room to assess problem with existing urinary catheter.  Patient was sitting on the toilet trying to have a BM.  He is complaining of burning in his bladder.  Patient noted to be urinating from penis, not catheter.  Attempted to flush foley, unsuccessful.  Spoke with Algis Liming, PA who recommend we exchange catheter.  Placed 16 F coude catheter using lidocaine.  1100 mL of cherry red urine returned with some sediment and clots visible.  Irrigated foley per PA request, no further clots observed.  Patient tolerated procedure fairly well.  Will continue to monitor.  Brita Romp, RN

## 2018-10-25 NOTE — Progress Notes (Signed)
Physical Therapy Session Note  Patient Details  Name: [name] MRN: [id number] Date of Birth: [birth year]  Today's Date: 10/25/2018 PT Individual Time: 0945-1100 PT Individual Time Calculation (min): 75 min   Short Term Goals: Week 1:  PT Short Term Goal 1 (Week 1): Pt will perform bed<>WC transfer with min assist PT Short Term Goal 1 - Progress (Week 1): Met PT Short Term Goal 2 (Week 1): Pt will ambulate 17f with min assist PT Short Term Goal 2 - Progress (Week 1): Met PT Short Term Goal 3 (Week 1): Pt will maintain standing balance x 2 minute with min assist PT Short Term Goal 3 - Progress (Week 1): Met Week 2:  PT Short Term Goal 1 (Week 2): STG=LTG due to ELOS  Skilled Therapeutic Interventions/Progress Updates:   Pt received sitting in WC and agreeable to PT. WC mobility x [id number] supervision assist and min cues for doorway management and navigaiton through room to prevent hitting L foot on obstacles. ait training with RW 2 x 20ft4fh increasing L knee pain with increased distance CGA-min assist due to L knee pain. PT performed Grade 3 anterior mobilization. 2 x 30 sec BLE. Additional gait training  X 53ft 91f CGA. Stair management training x 3 steps with BUE support on bil rails. Min assist from PT for safety. Kinetron 2 min x 2 30 CM/sec >20cm/sec with cues or full ROM and anterior weight shift to prevent posterior tipping. Patient returned to room and [facility] and all needs met.             Therapy Documentation Precautions:  Precautions Precautions: Fall Precaution Comments: Sore Lt ribs Restrictions Weight Bearing Restrictions: No General:   Vital Signs:   Pain: Denies at rest. 6/10 with gait in the Lknee.[place]   Therapy/Group: Individual Therapy  AustinLorie Phenix020, 11:25 AM

## 2018-10-25 NOTE — Progress Notes (Signed)
Occupational Therapy Session Note  Patient Details  Name: [name] MRN: [id number] Date of Birth: 31-Jul-1940  Today's Date: 10/25/2018 OT Individual Time: [phone] OT Individual Time Calculation (min): 56 min   Short Term Goals: Week 1:  OT Short Term Goal 1 (Week 1): Pt will complete bathing at sit > stand level mod assist OT Short Term Goal 2 (Week 1): Pt will complete LB dressing with mod assist at sit > stand level OT Short Term Goal 3 (Week 1): Pt will complete toilet transfer mod assist OT Short Term Goal 4 (Week 1): Pt will complete 1 grooming task in standing to demonstrate increased activity tolerance :     Skilled Therapeutic Interventions/Progress Updates:    Pt greeted in w/c, shaving at the sink. Very excited about the electric bed he bought for home. It will arrive before his d/c date. Pt completed oral care, hair brushing, and UB dressing at Mod I-Independent level, propelling in w/c to gather needed items. Pt with small bowel incontinence while seated, soiling towel placed in w/c. Steady assist for sit<stand and balance in front of sink while pt completed perihygiene and elevated clean brief over hips. When he sat back down, pt used the reacher to thread LEs into pants with supervision. He also assisted OT with threading his catheter. OT donned Ted stockings and pt used the shoe horn to don his sneakers. Due to pain near catheter site when bending slightly forward, pt required increased assist with footwear. We applied medicinal cream to knees for pain mgt. RN made aware. Pt left in care of RN for morning medicine at end of session, seated in w/c with chair alarm set. Tx focus placed on sit<stands, standing endurance, and ADL retraining.   Therapy Documentation Precautions:  Precautions Precautions: Fall Precaution Comments: Sore Lt ribs Restrictions Weight Bearing Restrictions: No Vital Signs: Therapy Vitals Temp: 97.7 F (36.5 C) Temp Source: Oral Pulse Rate:  86 Resp: 18 BP: 101/74 Patient Position (if appropriate): Sitting Oxygen Therapy SpO2: 95 % O2 Device: Room Air   ADL:        Therapy/Group: Individual Therapy  [name] [name] 10/25/2018, 3:55 PM

## 2018-10-26 ENCOUNTER — Inpatient Hospital Stay (HOSPITAL_COMMUNITY): Payer: Medicare Other | Admitting: Occupational Therapy

## 2018-10-26 DIAGNOSIS — M25561 Pain in right knee: Secondary | ICD-10-CM

## 2018-10-26 NOTE — Progress Notes (Signed)
[place] PHYSICAL MEDICINE & REHABILITATION PROGRESS NOTE   Subjective/Complaints: Patient sitting up in bed this morning.  He states he did not have good day yesterday due to clot in his catheter and bleeding with Foley.  He states he slept fairly overnight.  He notes penile pain.  ROS: + Penile pain.  Denies CP, SOB, N/V/D  Objective:   No results found. No results for input(s): WBC, HGB, HCT, PLT in the last 72 hours. No results for input(s): NA, K, CL, CO2, GLUCOSE, BUN, CREATININE, CALCIUM in the last 72 hours.  Intake/Output Summary (Last 24 hours) at 10/26/2018 1325 Last data filed at 10/26/2018 0757 Gross per 24 hour  Intake 240 ml  Output 725 ml  Net -485 ml     Physical Exam: Vital Signs Blood pressure 101/64, pulse 76, temperature 98.3 F (36.8 C), temperature source Oral, resp. rate 19, height 6\' 3"  (1.905 m), weight 117.3 kg, SpO2 93 %.  Constitutional: No distress . Vital signs reviewed. HENT: Normocephalic.  Atraumatic. Eyes: EOMI.  No discharge. Cardiovascular: No JVD. Respiratory: Normal effort. GI: Non-distended. Musc: No edema or tenderness in extremities.  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  Intact. Neurologic: Alert and oriented Motor: 4+/5 grossly throughout, unchanged  Assessment/Plan: 1. Functional deficits secondary to Debility related to PE which require 3+ hours per day of interdisciplinary therapy in a comprehensive inpatient rehab setting.  Physiatrist is providing close team supervision and 24 hour management of active medical problems listed below.  Physiatrist and rehab team continue to assess barriers to discharge/monitor patient progress toward functional and medical goals  Care Tool:  Bathing    Body parts bathed by patient: Right arm, Left arm, Chest, Abdomen, Front perineal area, Right upper leg, Left upper leg, Right lower leg, Left lower leg, Face   Body parts bathed by helper: Buttocks Body parts n/a: Buttocks   Bathing assist Assist Level: Minimal Assistance - Patient > 75%     Upper Body Dressing/Undressing Upper body dressing   What is the patient wearing?: Pull over shirt    Upper body assist Assist Level: Independent with assistive device    Lower Body Dressing/Undressing Lower body dressing      What is the patient wearing?: Pants     Lower body assist Assist for lower body dressing: Minimal Assistance - Patient > 75%(threading catheter)     Toileting Toileting    Toileting assist Assist for toileting: Maximal Assistance - Patient 25 - 49%     Transfers Chair/bed transfer  Transfers assist     Chair/bed transfer assist level: Supervision/Verbal cueing     Locomotion Ambulation   Ambulation assist      Assist level: Contact Guard/Touching assist Assistive device: Walker-rolling Max distance: 53   Walk 10 feet activity   Assist  Walk 10 feet activity did not occur: Safety/medical concerns  Assist level: Contact Guard/Touching assist Assistive device: Walker-rolling   Walk 50 feet activity   Assist Walk 50 feet with 2 turns activity did not occur: Safety/medical concerns  Assist level: Contact Guard/Touching assist Assistive device: Walker-rolling    Walk 150 feet activity   Assist Walk 150 feet activity did not occur: Safety/medical concerns         Walk 10 feet on uneven surface  activity   Assist Walk 10 feet on uneven surfaces activity did not occur: Safety/medical concerns         Wheelchair  Assist   Type of Wheelchair: Manual    Wheelchair assist level: Supervision/Verbal cueing Max wheelchair distance: 150    Wheelchair 50 feet with 2 turns activity    Assist        Assist Level: Supervision/Verbal cueing   Wheelchair 150 feet activity     Assist     Assist Level: Supervision/Verbal cueing    Medical Problem List and Plan: 1.   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  Antithrombotics: Pulmonary emboli/left peroneal DVT: Eliquis             -antiplatelet therapy: N/A 3. Pain Management: Lidoderm patch, Zanaflex 2 mg every 8 hours as needed  -voltaren gel for knees  -lidocaine ointment for right 2nd toe area 4. Mood: Provide emotional support             -antipsychotic agents: N/A 5. Neuropsych: This patient is capable of making decisions on his own behalf. 6. Skin/Wound Care: Routine skin checks 7. Fluids/Electrolytes/Nutrition: Routine in and outs.  8.  Hematuria as well as history of BPH with TURP.  Continue Flomax 0.4 mg daily, Avodart 0.5 mg daily.    Keep Foley tube per Urology follow-up as outpatient plan for potential voiding trial.  Will consider following up with urology on Monday  Significant bleeding yesterday, continues to have blood-tinged urine, has some urethral irritation from traction on the Foley   UA equivocal, urine culture pending  Encouraged ice  9.  Resting tremors.  Continue Inderal 10.  Hypertension.  Patient on no antihypertensive medications prior to admission. Monitor with increased mobility. Vitals:   10/25/18 1951 10/26/18 0331  BP: 122/80 101/64  Pulse: 98 76  Resp: 19 19  Temp: 98.4 F (36.9 C) 98.3 F (36.8 C)  SpO2: 97% 93%   Controlled on 7/4 11.  Constipation.  Laxative assistance, monitor for incont  Improving 12.  Acute blood loss anemia  See #8  Hemoglobin 11.4 on 6/30, labs ordered for Monday 13.  Transaminitis, may be elevated due to statin monitor  LFTs elevated on 6/25   LOS: 10 days A FACE TO FACE EVALUATION WAS PERFORMED  Gari Hartsell Karis Jubanil Zayli Villafuerte 10/26/2018, 1:25 PM

## 2018-10-26 NOTE — Progress Notes (Signed)
Occupational Therapy Session Note  Patient Details  Name: [name] MRN: [id number] Date of Birth: 1940/06/02  Today's Date: 10/26/2018 OT Individual Time: 2637-8588 OT Individual Time Calculation (min): 10 min    Skilled Therapeutic Interventions/Progress Updates: paitent with further foley cathether and possible other urinary challenge and now on antibiotics, following bleeding from penis and foley not fitting properly.   Patieh with ice applied to groin/penis area today and complaining of inflammation and pain.    Rested today to help decrease further excess bleeding and infammation.    Patient asked many questions and stated he cannot eliminate bowels utlizing bed pan.   This clinician advised that if needed and if nursing feels safe, to call for bed side commode transfer for BM.  Patient left lying in bed to rest and restore from pain & inflmmation of foley catheter and possible other urinary type challenge.   Call bell and phone within reach     Therapy Documentation Precautions:  Precautions Precautions: Fall Precaution Comments: Sore Lt ribs Restrictions Weight Bearing Restrictions: No General: General OT Amount of Missed Time: 20 Minutes(22 foley/penis issue, inflmmatiom, pain, bleeding)   Pain:10+/10 penis and groin area following foley catheter and other issue     Therapy/Group: Individual Therapy  Alfredia Ferguson [facility] 10/26/2018, 2:36 PM

## 2018-10-26 NOTE — Progress Notes (Signed)
Occupational Therapy Weekly Progress Note  Patient Details  Name: [name] MRN: [id number] Date of Birth: 07/28/1940  Beginning of progress report period:  10/17/18 End of progress report period: 10/26/18   Today's Date: 10/26/2018 OT Individual Time: 7903-8333 OT Individual Time Calculation (min): 11 min  19 minutes missed.   Patient has met 3 of 4 short term goals.    Pt is making steady progress at time of report. He presently requires Mod A for self care tasks overall. Min A for ambulatory bathroom transfers using RW. AE training has been initiated to manage LE and groin pain during LB ADLs. D/c planning is ongoing with active pt-OT collaboration during tx sessions. Pt is on track for LTG achievement set at supervision overall. Continue POC.   Patient continues to demonstrate the following deficits: muscle weakness, decreased cardiorespiratoy endurance and decreased standing balance and decreased balance strategies and therefore will continue to benefit from skilled OT intervention to enhance overall performance with BADL.  Patient progressing toward long term goals..  Continue plan of care.  OT Short Term Goals Week 1:  OT Short Term Goal 1 (Week 1): Pt will complete bathing at sit > stand level mod assist OT Short Term Goal 1 - Progress (Week 1): Met OT Short Term Goal 2 (Week 1): Pt will complete LB dressing with mod assist at sit > stand level OT Short Term Goal 2 - Progress (Week 1): Met OT Short Term Goal 3 (Week 1): Pt will complete toilet transfer mod assist OT Short Term Goal 3 - Progress (Week 1): Met OT Short Term Goal 4 (Week 1): Pt will complete 1 grooming task in standing to demonstrate increased activity tolerance OT Short Term Goal 4 - Progress (Week 1): Progressing toward goal Week 2:  OT Short Term Goal 1 (Week 2): STGs=LTGs due to ELOS  Skilled Therapeutic Interventions/Progress Updates:    Pt greeted in bed, still sore from his catheter incident. Declining  therapeutic activity due to soreness. OT provided pt with new ice pack for groin and offered emotional support. 19 minutes missed.   Therapy Documentation Precautions:  Precautions Precautions: Fall Precaution Comments: Sore Lt ribs Restrictions Weight Bearing Restrictions: No Pain: Pain Assessment Pain Scale: 0-10 Pain Score: 4  Pain Type: Acute pain Pain Location: Penis Pain Descriptors / Indicators: Sharp Pain Frequency: Intermittent Pain Onset: Gradual Pain Intervention(s): Medication (See eMAR) ADL:       Therapy/Group: Individual Therapy  Tyrae Alcoser A Leshea Jaggers 10/26/2018, 7:36 AM

## 2018-10-27 ENCOUNTER — Inpatient Hospital Stay (HOSPITAL_COMMUNITY): Payer: Medicare Other | Admitting: Occupational Therapy

## 2018-10-27 LAB — URINE CULTURE: Culture: 50000 — AB

## 2018-10-27 NOTE — Progress Notes (Signed)
[place] PHYSICAL MEDICINE & REHABILITATION PROGRESS NOTE   Subjective/Complaints: Patient seen sitting up in his chair this morning.  He states he slept well overnight.  He states he feels much better than yesterday.  ROS: + Penile pain, improving.  Denies CP, SOB, N/V/D  Objective:   No results found. No results for input(s): WBC, HGB, HCT, PLT in the last 72 hours. No results for input(s): NA, K, CL, CO2, GLUCOSE, BUN, CREATININE, CALCIUM in the last 72 hours.  Intake/Output Summary (Last 24 hours) at 10/27/2018 0955 Last data filed at 10/27/2018 0100 Gross per 24 hour  Intake 720 ml  Output 2050 ml  Net -1330 ml     Physical Exam: Vital Signs Blood pressure 109/69, pulse 79, temperature 98.5 F (36.9 C), temperature source Oral, resp. rate 18, height 6\' 3"  (1.905 m), weight 117.3 kg, SpO2 93 %.  Constitutional: No distress . Vital signs reviewed. HENT: Normocephalic.  Atraumatic. Eyes: EOMI.  No discharge. Cardiovascular: No JVD. Respiratory: Normal effort. GI: Non-distended. Musc: No edema or tenderness in extremities.  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  Intact. Neurologic: Alert and oriented Motor: Bilateral upper extremities: 5/5 proximal distal Bilateral lower extremities: 4+/5 proximal distal  Assessment/Plan: 1. Functional deficits secondary to Debility related to PE which require 3+ hours per day of interdisciplinary therapy in a comprehensive inpatient rehab setting.  Physiatrist is providing close team supervision and 24 hour management of active medical problems listed below.  Physiatrist and rehab team continue to assess barriers to discharge/monitor patient progress toward functional and medical goals  Care Tool:  Bathing    Body parts bathed by patient: Right arm, Left arm, Chest, Abdomen, Front perineal area, Right upper leg, Left upper leg, Right lower leg, Left lower leg,  Face   Body parts bathed by helper: Buttocks Body parts n/a: Buttocks   Bathing assist Assist Level: Minimal Assistance - Patient > 75%     Upper Body Dressing/Undressing Upper body dressing   What is the patient wearing?: Pull over shirt    Upper body assist Assist Level: Independent with assistive device    Lower Body Dressing/Undressing Lower body dressing      What is the patient wearing?: Pants     Lower body assist Assist for lower body dressing: Minimal Assistance - Patient > 75%(threading catheter)     Toileting Toileting    Toileting assist Assist for toileting: Maximal Assistance - Patient 25 - 49%     Transfers Chair/bed transfer  Transfers assist     Chair/bed transfer assist level: Supervision/Verbal cueing     Locomotion Ambulation   Ambulation assist      Assist level: Contact Guard/Touching assist Assistive device: Walker-rolling Max distance: 53   Walk 10 feet activity   Assist  Walk 10 feet activity did not occur: Safety/medical concerns  Assist level: Contact Guard/Touching assist Assistive device: Walker-rolling   Walk 50 feet activity   Assist Walk 50 feet with 2 turns activity did not occur: Safety/medical concerns  Assist level: Contact Guard/Touching assist Assistive device: Walker-rolling    Walk 150 feet activity   Assist Walk 150 feet activity did not occur: Safety/medical concerns         Walk 10 feet on uneven surface  activity   Assist Walk 10 feet on uneven surfaces activity did not occur: Safety/medical concerns         Wheelchair  Assist   Type of Wheelchair: Manual    Wheelchair assist level: Supervision/Verbal cueing Max wheelchair distance: 150    Wheelchair 50 feet with 2 turns activity    Assist        Assist Level: Supervision/Verbal cueing   Wheelchair 150 feet activity     Assist     Assist Level: Supervision/Verbal cueing    Medical Problem List and  Plan: 1.  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  Antithrombotics: Pulmonary emboli/left peroneal DVT: Eliquis             -antiplatelet therapy: N/A 3. Pain Management: Lidoderm patch, Zanaflex 2 mg every 8 hours as needed  -voltaren gel for knees  -lidocaine ointment for right 2nd toe area 4. Mood: Provide emotional support             -antipsychotic agents: N/A 5. Neuropsych: This patient is capable of making decisions on his own behalf. 6. Skin/Wound Care: Routine skin checks 7. Fluids/Electrolytes/Nutrition: Routine in and outs.  8.  Hematuria as well as history of BPH with TURP.  Continue Flomax 0.4 mg daily, Avodart 0.5 mg daily.    Keep Foley tube per Urology follow-up as outpatient plan for potential voiding trial.  Will consider following up with urology this week  Significant bleeding over the weekend, continues to have blood-tinged urine, has some urethral irritation from traction on the Foley, improving  UA equivocal, urine culture likely contaminant, continue to monitor  Continue ice  9.  Resting tremors.  Continue Inderal 10.  Hypertension.  Patient on no antihypertensive medications prior to admission. Monitor with increased mobility. Vitals:   10/26/18 1940 10/27/18 0403  BP: 117/74 109/69  Pulse: 83 79  Resp: 18 18  Temp: 98.4 F (36.9 C) 98.5 F (36.9 C)  SpO2: 94% 93%   Controlled on 7/5 11.  Constipation.  Laxative assistance, monitor for incont  Improving 12.  Acute blood loss anemia  See #8  Hemoglobin 11.4 on 6/30, labs ordered for tomorrow 13.  Transaminitis, may be elevated due to statin monitor  LFTs elevated on 6/25   LOS: 11 days A FACE TO FACE EVALUATION WAS PERFORMED  Wendee Hata [name] 10/27/2018, 9:55 AM

## 2018-10-27 NOTE — Progress Notes (Signed)
Reports pain to penis "getting better." PRN tylenol and zanaflex given at 2032 and 0447. Also applying ice to penis. BLE with pitting edema, especially to tops of feet. [name] A

## 2018-10-27 NOTE — Plan of Care (Signed)
  Problem: Consults Goal: RH GENERAL PATIENT EDUCATION Description: See Patient Education module for education specifics. Outcome: Progressing   Problem: RH BOWEL ELIMINATION Goal: RH STG MANAGE BOWEL WITH ASSISTANCE Description: STG Manage Bowel with Mod I Assistance. Outcome: Progressing Goal: RH STG MANAGE BOWEL W/MEDICATION W/ASSISTANCE Description: STG Manage Bowel with Medication with Mod I Assistance. Outcome: Progressing   Problem: RH SKIN INTEGRITY Goal: RH STG MAINTAIN SKIN INTEGRITY WITH ASSISTANCE Description: STG Maintain Skin Integrity With [place]. Outcome: Progressing Goal: RH STG ABLE TO PERFORM INCISION/WOUND CARE W/ASSISTANCE Description: STG Able To Perform Incision/Wound Care With Assistance. Outcome: Progressing   Problem: RH SAFETY Goal: RH STG ADHERE TO SAFETY PRECAUTIONS W/ASSISTANCE/DEVICE Description: STG Adhere to Safety Precautions With cues/reminders. Outcome: Progressing   Problem: RH PAIN MANAGEMENT Goal: RH STG PAIN MANAGED AT OR BELOW PT'S PAIN GOAL Outcome: Progressing   Problem: RH KNOWLEDGE DEFICIT GENERAL Goal: RH STG INCREASE KNOWLEDGE OF SELF CARE AFTER HOSPITALIZATION Outcome: Progressing   Problem: RH BLADDER ELIMINATION Goal: RH STG MANAGE BLADDER WITH MEDICATION WITH ASSISTANCE Description: STG Manage Bladder With Medication With Mod I Assistance. Outcome: Not Progressing Goal: RH STG MANAGE BLADDER WITH EQUIPMENT WITH ASSISTANCE Description: STG Manage Bladder With Equipment With No Assistance Outcome: Not Progressing

## 2018-10-27 NOTE — Progress Notes (Signed)
Occupational Therapy Session Note  Patient Details  Name: [name] MRN: [id number] Date of Birth: 03-Aug-1940  Today's Date: 10/27/2018 OT Individual Time: [phone] OT Individual Time Calculation (min): 43 min   Short Term Goals: Week 2:  OT Short Term Goal 1 (Week 2): STGs=LTGs due to ELOS  Skilled Therapeutic Interventions/Progress Updates:    Pt greeted in w/c, still sore in groin, but feeling well enough to use the toilet. Stand pivot<elevated toilet completed with steady assist. Pt with continent BM while sitting. Max A for hygiene, Mod A for donning clean brief. Able to stand with steady assist-close supervision for balance. When he returned to w/c, pt completed shaving and oral care while sitting at the sink. Declined standing due to groin pain and wanting to take it easy today. Pt left in w/c with safety belt and call bell beside bed. Tx focus placed on functional transfers, standing balance, and standing endurance during self care tasks.   Therapy Documentation Precautions:  Precautions Precautions: Fall Precaution Comments: Sore Lt ribs Restrictions Weight Bearing Restrictions: No Pain: Pt reporting pain to be manageable during session with rest breaks and modification of activities   ADL:       Therapy/Group: Individual Therapy  Detrice Cales A Lauria Depoy 10/27/2018, 12:25 PM

## 2018-10-28 ENCOUNTER — Inpatient Hospital Stay (HOSPITAL_COMMUNITY): Payer: Medicare Other | Admitting: Occupational Therapy

## 2018-10-28 ENCOUNTER — Inpatient Hospital Stay (HOSPITAL_COMMUNITY): Payer: Medicare Other | Admitting: Physical Therapy

## 2018-10-28 LAB — CBC
HCT: 36.9 % — ABNORMAL LOW (ref 39.0–52.0)
Hemoglobin: 12.1 g/dL — ABNORMAL LOW (ref 13.0–17.0)
MCH: 32.3 pg (ref 26.0–34.0)
MCHC: 32.8 g/dL (ref 30.0–36.0)
MCV: 98.4 fL (ref 80.0–100.0)
Platelets: 295 10*3/uL (ref 150–400)
RBC: 3.75 MIL/uL — ABNORMAL LOW (ref 4.22–5.81)
RDW: 13.3 % (ref 11.5–15.5)
WBC: 9 10*3/uL (ref 4.0–10.5)
nRBC: 0 % (ref 0.0–0.2)

## 2018-10-28 NOTE — Progress Notes (Signed)
At 0130, up to BR with continent BM. Foley patent with amber urine and sediment. Patient perseverating on foley, "when is it going to be removed, I need to go see the  urologist as soon as I'm discharged, how am I going to manage this at home, what if it gets clogged again?" Reassured patient and spoke at length R/T questions. Reports penis much better, after foley replaced. Using ice to penis and taking zanaflex & tylenol for pain. BLE' with pitting edema. Heels red, but patient reports he can't tolerate heels elevated off bed with pillows. Patrici Ranks A

## 2018-10-28 NOTE — Progress Notes (Signed)
Physical Therapy Session Note  Patient Details  Name: [name] MRN: [id number] Date of Birth: [birth year]  Today's Date: 10/28/2018 PT Individual Time: 1205-1235 and 1400-1530 PT Individual Time Calculation (min): 30 min and 90 min  Short Term Goals: Week 2:  PT Short Term Goal 1 (Week 2): STG=LTG due to ELOS  Skilled Therapeutic Interventions/Progress Updates: Tx1: Pt presented in w/c agreeable to therapy. Pt indicating minimal pain in B knees medial aspect. Pt transported to rehab gym for time management. Set up to perform stair training x 2 steps, per pt needs to "lubricate" knees prior to standing. Performed B knee flexion/extension ROM in seated with towel under feet in recipriocal motion. Pt also indicating foley pressing into leg. PTA taped tubing to leg to minimize friction with pt satisfaction. Pt performed ascending forward descending backwards x 2 steps via step to pattern with minA and increased time. Pt noted to have heavy use of BUE to pull pt up step. Discussed use of set up at home and per pt has extension of rail past second step and feels will be able to use as leverage. Pt noted no significant increase in pain after activity. Pt transported back to room at end of session and left with lunch tray set up and needs met.   Tx2: Pt presented in w/c agreeable to therapy. Session focused on ambulation and BLE strengthening. Pt transported to rehab gym and performed stand pivot transfer w/c to NuStep CGA.Marland Kitchen Pt set up for NuStep however when began activity pt with significantly increased pain at foley insertion. Pt was unable to complete task due to pain. Pt was able to return to w/c in same manner with increased time. Pt however was able to tolerate Cybex Kinetron. Performed for BLE strengthening 3 min 50cm/sec and 3 min 40 cm/sec with overall good tolerance. Pt continue to have frequent pain at foley insertion, PTA advised nursing and was advised only able to ice penis at this time for  pain management (pt agreeable to ice after session). Pt then participated in gait training for BLE and endurance with pt ambulating 71f x 2 and 220fx1 with seated rest between bouts. Pt noted to initially demonstrate fair B foot clearance with each step with decreased with fatigue. Pt expressed more limited by penile pain vs fatigue or knee pain. Pt "walked" w/c remaining distance back to room and requested to use bathroom. Performed stand pivot transfer with CGA and use of wall rail with PTA providing modA for clothing management (-BM). Pt performed pericare with set up and returned to w/c in same manner as prior. Pt remained in w/c at end of session with ice pack provided and current needs met.      Therapy Documentation Precautions:  Precautions Precautions: Fall Precaution Comments: Sore Lt ribs Restrictions Weight Bearing Restrictions: No General:   Vital Signs: Therapy Vitals Temp: 98.2 F (36.8 C) Temp Source: Oral Pulse Rate: 83 Resp: 16 BP: 97/64 Patient Position (if appropriate): Sitting Oxygen Therapy SpO2: 97 % O2 Device: Room Air Pain:   Mobility:   Locomotion :    Trunk/Postural Assessment :    Balance:   Exercises:   Other Treatments:      Therapy/Group: Individual Therapy  [place] 10/28/2018, 3:40 PM

## 2018-10-28 NOTE — Progress Notes (Signed)
[place] PHYSICAL MEDICINE & REHABILITATION PROGRESS NOTE   Subjective/Complaints: Patient has questions of what to do in the event that he has problems with his urinary catheter.  Over the weekend he did have a clot formed in the Foley catheter.  ROS: - Penile pain, Denies CP, SOB, N/V/D  Objective:   No results found. No results for input(s): WBC, HGB, HCT, PLT in the last 72 hours. No results for input(s): NA, K, CL, CO2, GLUCOSE, BUN, CREATININE, CALCIUM in the last 72 hours.  Intake/Output Summary (Last 24 hours) at 10/28/2018 1118 Last data filed at 10/28/2018 1000 Gross per 24 hour  Intake 960 ml  Output 2025 ml  Net -1065 ml     Physical Exam: Vital Signs Blood pressure 108/78, pulse 79, temperature 98.1 F (36.7 C), resp. rate 18, height 6\' 3"  (1.905 m), weight 117.3 kg, SpO2 94 %.  Constitutional: No distress . Vital signs reviewed. HENT: Normocephalic.  Atraumatic. Eyes: EOMI.  No discharge. Cardiovascular: No JVD. Respiratory: Normal effort. GI: Non-distended. Musc: No edema or tenderness in extremities.  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  Intact. Neurologic: Alert and oriented Motor: Bilateral upper extremities: 5/5 proximal distal Bilateral lower extremities: 4+/5 proximal distal  Assessment/Plan: 1. Functional deficits secondary to Debility related to PE which require 3+ hours per day of interdisciplinary therapy in a comprehensive inpatient rehab setting.  Physiatrist is providing close team supervision and 24 hour management of active medical problems listed below.  Physiatrist and rehab team continue to assess barriers to discharge/monitor patient progress toward functional and medical goals  Care Tool:  Bathing    Body parts bathed by patient: Right arm, Left arm, Chest, Abdomen, Front perineal area, Right upper leg, Left upper leg, Right lower leg, Left lower leg, Face   Body parts bathed by helper: Buttocks Body parts n/a: Buttocks   Bathing assist Assist Level: Minimal Assistance - Patient > 75%     Upper Body Dressing/Undressing Upper body dressing   What is the patient wearing?: Pull over shirt    Upper body assist Assist Level: Independent with assistive device    Lower Body Dressing/Undressing Lower body dressing      What is the patient wearing?: Pants     Lower body assist Assist for lower body dressing: Minimal Assistance - Patient > 75%(threading catheter)     Toileting Toileting    Toileting assist Assist for toileting: Maximal Assistance - Patient 25 - 49%     Transfers Chair/bed transfer  Transfers assist     Chair/bed transfer assist level: Supervision/Verbal cueing     Locomotion Ambulation   Ambulation assist      Assist level: Contact Guard/Touching assist Assistive device: Walker-rolling Max distance: 53   Walk 10 feet activity   Assist  Walk 10 feet activity did not occur: Safety/medical concerns  Assist level: Contact Guard/Touching assist Assistive device: Walker-rolling   Walk 50 feet activity   Assist Walk 50 feet with 2 turns activity did not occur: Safety/medical concerns  Assist level: Contact Guard/Touching assist Assistive device: Walker-rolling    Walk 150 feet activity   Assist Walk 150 feet activity did not occur: Safety/medical concerns         Walk 10 feet on uneven surface  activity   Assist Walk 10 feet on uneven surfaces activity did not occur: Safety/medical concerns         Wheelchair  Assist   Type of Wheelchair: Manual    Wheelchair assist level: Supervision/Verbal cueing Max wheelchair distance: 150    Wheelchair 50 feet with 2 turns activity    Assist        Assist Level: Supervision/Verbal cueing   Wheelchair 150 feet activity     Assist     Assist Level: Supervision/Verbal cueing    Medical Problem List and Plan: 1.   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  Antithrombotics: Pulmonary emboli/left peroneal DVT: Eliquis             -antiplatelet therapy: N/A 3. Pain Management: Lidoderm patch, Zanaflex 2 mg every 8 hours as needed  -voltaren gel for knees  -lidocaine ointment for right 2nd toe area 4. Mood: Provide emotional support             -antipsychotic agents: N/A 5. Neuropsych: This patient is capable of making decisions on his own behalf. 6. Skin/Wound Care: Routine skin checks 7. Fluids/Electrolytes/Nutrition: Routine in and outs.  8.  Hematuria as well as history of BPH with TURP.  Continue Flomax 0.4 mg daily, Avodart 0.5 mg daily.    Keep Foley tube per Urology follow-up as outpatient plan for potential voiding trial.  Schedule appointment for urology next week urine is now clear.  He will need irrigation of his Foley on a daily basis while he is here on rehab and then at home home health can perform this twice a week or as recommended by urology after follow-up visit 9.  Resting tremors.  Continue Inderal 10.  Hypertension.  Patient on no antihypertensive medications prior to admission. Monitor with increased mobility. Vitals:   10/27/18 2013 10/28/18 0627  BP: 116/77 108/78  Pulse: 95 79  Resp: 18 18  Temp: 98.4 F (36.9 C) 98.1 F (36.7 C)  SpO2: 95% 94%   Controlled on 7/6 11.  Constipation.  Laxative assistance, monitor for incont  Improving 12.  Acute blood loss anemia, improving despite episode of hematuria  See #8  Hemoglobin 11.4 on 6/30, up to 12.1 on 7/6 13.  Transaminitis, may be elevated due to statin monitor  LFTs elevated on 6/25   LOS: 12 days A FACE TO FACE EVALUATION WAS PERFORMED  Erick Colacendrew E Baudelio Karnes 10/28/2018, 11:18 AM

## 2018-10-28 NOTE — Progress Notes (Signed)
Occupational Therapy Session Note  Patient Details  Name: [name] MRN: [id number] Date of Birth: 05-23-1940  Today's Date: 10/28/2018 OT Individual Time: 1001-1100 OT Individual Time Calculation (min): 59 min    Short Term Goals: Week 1:  OT Short Term Goal 1 (Week 1): Pt will complete bathing at sit > stand level mod assist OT Short Term Goal 1 - Progress (Week 1): Met OT Short Term Goal 2 (Week 1): Pt will complete LB dressing with mod assist at sit > stand level OT Short Term Goal 2 - Progress (Week 1): Met OT Short Term Goal 3 (Week 1): Pt will complete toilet transfer mod assist OT Short Term Goal 3 - Progress (Week 1): Met OT Short Term Goal 4 (Week 1): Pt will complete 1 grooming task in standing to demonstrate increased activity tolerance OT Short Term Goal 4 - Progress (Week 1): Progressing toward goal Week 2:  OT Short Term Goal 1 (Week 2): STGs=LTGs due to ELOS  Skilled Therapeutic Interventions/Progress Updates:    Upon entering the room, pt in wheelchair in bathroom with NT. Pt performed stand pivot transfer onto toilet with min guard. Pt having BM this session and encouraged to perform hygiene and able to do so with min guard for standing balance. Pt returning to wheelchair and seated at sink for UB self care with set up A to obtain all needed items. Pt donning shoes with increased time and use of reacher and sock aide. Pt needing assistance to tie shoe laces and OT discussed use of shoe buttons in next session. Pt remained in wheelchair with chair alarm activated and call bell within reach.   Therapy Documentation Precautions:  Precautions Precautions: Fall Precaution Comments: Sore Lt ribs Restrictions Weight Bearing Restrictions: No Pain: Pain Assessment Pain Scale: 0-10 Pain Score: 0-No pain   Therapy/Group: Individual Therapy  Gypsy Decant 10/28/2018, 12:00 PM

## 2018-10-29 ENCOUNTER — Inpatient Hospital Stay (HOSPITAL_COMMUNITY): Payer: Medicare Other | Admitting: Occupational Therapy

## 2018-10-29 ENCOUNTER — Inpatient Hospital Stay (HOSPITAL_COMMUNITY): Payer: Medicare Other | Admitting: Physical Therapy

## 2018-10-29 NOTE — Progress Notes (Signed)
Physical Therapy Session Note  Patient Details  Name: [name] MRN: [id number] Date of Birth: July 05, 1940  Today's Date: 10/29/2018 PT Individual Time: 0805-0915 PT Individual Time Calculation (min): 70 min   Short Term Goals: Week 2:  PT Short Term Goal 1 (Week 2): STG=LTG due to ELOS  Skilled Therapeutic Interventions/Progress Updates: Pt presented in bed agreeable to therapy. Pt indicated have not received meds as of yet. Advised best to initiate session and be prepared for when nsg arrives. PTA and pt discussed at length bed height and sequencing to minimize pain during mobility. Pt was able to scoot to EOB and perform supine to sit from flat bed with supervision assist. Performed ambulatory transfer to w/c and pt transported to bathroom for toilet transfer. Performed stand pivot with use of wall rail and supervision to toilet (+BM). Pt was able to perform peri-care with washcloth and supervision. PTA threaded pants for time management and pt was able to pull up pants and brief over hips and maintain standing with supervision. Performed stand pivot back to w/c with wall rail and supervision and once out of bathroom pt asked PTA questions at length regarding DME. PTA provided measurement regarding width of RW standard vs heavy duty. PTA obtained standard RW and pt was able to perform STS and ambulate approx 10 ft with RW CGA. Pt stated feeling comfortable with standard device. Pt left in w/c at end of session with MD, nsg, and LSW present.      Therapy Documentation Precautions:  Precautions Precautions: Fall Precaution Comments: Sore Lt ribs Restrictions Weight Bearing Restrictions: No General:   Vital Signs: Therapy Vitals Temp: 98.4 F (36.9 C) Temp Source: Oral Pulse Rate: 85 Resp: 14 BP: 113/73 Patient Position (if appropriate): Sitting Oxygen Therapy SpO2: 95 % O2 Device: Room Air    Therapy/Group: Individual Therapy  Rodgerick Gilliand  Bitania Shankland,  PTA  10/29/2018, 4:16 PM

## 2018-10-29 NOTE — Plan of Care (Signed)
  Problem: Consults Goal: RH GENERAL PATIENT EDUCATION Description: See Patient Education module for education specifics. Outcome: Progressing   Problem: RH BOWEL ELIMINATION Goal: RH STG MANAGE BOWEL WITH ASSISTANCE Description: STG Manage Bowel with Mod I Assistance. Outcome: Progressing Goal: RH STG MANAGE BOWEL W/MEDICATION W/ASSISTANCE Description: STG Manage Bowel with Medication with Mod I Assistance. Outcome: Progressing   Problem: RH BLADDER ELIMINATION Goal: RH STG MANAGE BLADDER WITH MEDICATION WITH ASSISTANCE Description: STG Manage Bladder With Medication With Mod I Assistance. Outcome: Progressing Goal: RH STG MANAGE BLADDER WITH EQUIPMENT WITH ASSISTANCE Description: STG Manage Bladder With Equipment With No Assistance Outcome: Progressing   Problem: RH SKIN INTEGRITY Goal: RH STG MAINTAIN SKIN INTEGRITY WITH ASSISTANCE Description: STG Maintain Skin Integrity With Min Assistance. Outcome: Progressing Goal: RH STG ABLE TO PERFORM INCISION/WOUND CARE W/ASSISTANCE Description: STG Able To Perform Incision/Wound Care With Assistance. Outcome: Progressing   Problem: RH SAFETY Goal: RH STG ADHERE TO SAFETY PRECAUTIONS W/ASSISTANCE/DEVICE Description: STG Adhere to Safety Precautions With cues/reminders. Outcome: Progressing   Problem: RH PAIN MANAGEMENT Goal: RH STG PAIN MANAGED AT OR BELOW PT'S PAIN GOAL Outcome: Progressing   Problem: RH KNOWLEDGE DEFICIT GENERAL Goal: RH STG INCREASE KNOWLEDGE OF SELF CARE AFTER HOSPITALIZATION Outcome: Progressing   

## 2018-10-29 NOTE — Progress Notes (Signed)
Occupational Therapy Session Note  Patient Details  Name: [name] MRN: [id number] Date of Birth: 03/05/1941  Today's Date: 10/29/2018 OT Individual Time: 1103-1200 and 1400-1500 OT Individual Time Calculation (min): 57 min and 60 min   Short Term Goals: Week 1:  OT Short Term Goal 1 (Week 1): Pt will complete bathing at sit > stand level mod assist OT Short Term Goal 1 - Progress (Week 1): Met OT Short Term Goal 2 (Week 1): Pt will complete LB dressing with mod assist at sit > stand level OT Short Term Goal 2 - Progress (Week 1): Met OT Short Term Goal 3 (Week 1): Pt will complete toilet transfer mod assist OT Short Term Goal 3 - Progress (Week 1): Met OT Short Term Goal 4 (Week 1): Pt will complete 1 grooming task in standing to demonstrate increased activity tolerance OT Short Term Goal 4 - Progress (Week 1): Progressing toward goal Week 2:  OT Short Term Goal 1 (Week 2): STGs=LTGs due to ELOS  Skilled Therapeutic Interventions/Progress Updates:    Session 1: Upon entering the room, pt very verbose and continued to discuss home set up. OT applying shoe buttons to both shoes and demonstrated how to use to increase I with LB self care. Pt becoming very upset as OT saw blood cloth in catheter tubing and pushed clot out into bag. Pt requesting therapist notify RN immediately and remaining in wheelchair with all needs within reach.   Session 2: Upon entering the room, pt seated in wheelchair with no c/o pain this session. Pt agreeable to OT intervention. Pt verbalizing knowing he must take home catheter and wishing to be educated on how to drain it safely. OT demonstrating and pt returning demonstrating with min cuing for proper technique. This session pt draining into measuring container but advised him that he could likely drain into toilet at home. Pt propelled wheelchair to day room to utilize the kinetron 2x 4 minutes. OT assisted pt to gym and pt ambulating up and down 2 steps x 2 reps  with use of rails and heavy reliance on UEs. Pt needing min cuing for technique and min guard for safety. Pt ambulating two bouts towards room for 20' and then 35' with RW and min guard. Pt remained in wheelchair at end of session with chair alarm activated and ice pack requested. Call bell within reach.   Therapy Documentation Precautions:  Precautions Precautions: Fall Precaution Comments: Sore Lt ribs Restrictions Weight Bearing Restrictions: No   Therapy/Group: Individual Therapy  ,  P 10/29/2018, 12:37 PM  

## 2018-10-29 NOTE — Progress Notes (Addendum)
[place] PHYSICAL MEDICINE & REHABILITATION PROGRESS NOTE   Subjective/Complaints:  Discussed details regarding discharge planning.  Urine has remained clear  ROS: - Penile pain, Denies CP, SOB, N/V/D  Objective:   No results found. Recent Labs    10/28/18 0957  WBC 9.0  HGB 12.1*  HCT 36.9*  PLT 295   No results for input(s): NA, K, CL, CO2, GLUCOSE, BUN, CREATININE, CALCIUM in the last 72 hours.  Intake/Output Summary (Last 24 hours) at 10/29/2018 0840 Last data filed at 10/29/2018 0700 Gross per 24 hour  Intake 480 ml  Output 2175 ml  Net -1695 ml     Physical Exam: Vital Signs Blood pressure 108/79, pulse 82, temperature 98.1 F (36.7 C), temperature source Oral, resp. rate 16, height 6\' 3"  (1.905 m), weight 117.3 kg, SpO2 96 %.  Constitutional: No distress . Vital signs reviewed. HENT: Normocephalic.  Atraumatic. Eyes: EOMI.  No discharge. Cardiovascular: No JVD. Respiratory: Normal effort. GI: Non-distended. Musc: No edema or tenderness in extremities.  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  Intact. Neurologic: Alert and oriented Motor: Bilateral upper extremities: 5/5 proximal distal Bilateral lower extremities: 4+/5 proximal distal  Assessment/Plan: 1. Functional deficits secondary to Debility related to PE which require 3+ hours per day of interdisciplinary therapy in a comprehensive inpatient rehab setting.  Physiatrist is providing close team supervision and 24 hour management of active medical problems listed below.  Physiatrist and rehab team continue to assess barriers to discharge/monitor patient progress toward functional and medical goals  Care Tool:  Bathing    Body parts bathed by patient: Right arm, Left arm, Chest, Abdomen, Front perineal area, Right upper leg, Left upper leg, Right lower leg, Left lower leg, Face   Body parts bathed by helper: Buttocks Body parts  n/a: Buttocks   Bathing assist Assist Level: Minimal Assistance - Patient > 75%     Upper Body Dressing/Undressing Upper body dressing   What is the patient wearing?: Pull over shirt    Upper body assist Assist Level: Independent with assistive device    Lower Body Dressing/Undressing Lower body dressing      What is the patient wearing?: Pants     Lower body assist Assist for lower body dressing: Minimal Assistance - Patient > 75%(threading catheter)     Toileting Toileting    Toileting assist Assist for toileting: Contact Guard/Touching assist     Transfers Chair/bed transfer  Transfers assist     Chair/bed transfer assist level: Supervision/Verbal cueing     Locomotion Ambulation   Ambulation assist      Assist level: Contact Guard/Touching assist Assistive device: Walker-rolling Max distance: 53   Walk 10 feet activity   Assist  Walk 10 feet activity did not occur: Safety/medical concerns  Assist level: Contact Guard/Touching assist Assistive device: Walker-rolling   Walk 50 feet activity   Assist Walk 50 feet with 2 turns activity did not occur: Safety/medical concerns  Assist level: Contact Guard/Touching assist Assistive device: Walker-rolling    Walk 150 feet activity   Assist Walk 150 feet activity did not occur: Safety/medical concerns         Walk 10 feet on uneven surface  activity   Assist Walk 10 feet on uneven surfaces activity did not occur: Safety/medical concerns         Wheelchair     Assist   Type of Wheelchair: Manual    Wheelchair assist  level: Supervision/Verbal cueing Max wheelchair distance: 150    Wheelchair 50 feet with 2 turns activity    Assist        Assist Level: Supervision/Verbal cueing   Wheelchair 150 feet activity     Assist     Assist Level: Supervision/Verbal cueing    Medical Problem List and Plan: 1.  Debility secondary to bilateral lower lobe pulmonary  emboli/multi-medical  Continue CIR PT, OT Team conf in am  2.  Antithrombotics: Pulmonary emboli/left peroneal DVT: Eliquis             -antiplatelet therapy: N/A 3. Pain Management: Lidoderm patch, Zanaflex 2 mg every 8 hours as needed  -voltaren gel for knees  -lidocaine ointment for right 2nd toe area 4. Mood: Provide emotional support             -antipsychotic agents: N/A 5. Neuropsych: This patient is capable of making decisions on his own behalf. 6. Skin/Wound Care: Routine skin checks 7. Fluids/Electrolytes/Nutrition: Routine in and outs.  8.  Hematuria as well as history of BPH with TURP.  Continue Flomax 0.4 mg daily, Avodart 0.5 mg daily.    Keep Foley tube per Urology follow-up as outpatient plan for potential voiding trial.  Schedule appointment for urology next week urine is now clear.  He will need irrigation of his Foley on a daily basis while he is here on rehab and then at home home health can perform this twice a week or as recommended by urology after follow-up visit 9.  Resting tremors.  Continue Inderal 10.  Hypertension.  Patient on no antihypertensive medications prior to admission. Monitor with increased mobility. Vitals:   10/28/18 1953 10/29/18 0626  BP: 102/61 108/79  Pulse: 77 82  Resp: 16 16  Temp: 97.6 F (36.4 C) 98.1 F (36.7 C)  SpO2: 92% 96%   Controlled on 7/7 11.  Constipation.  Laxative assistance, monitor for incont  Improving 12.  Acute blood loss anemia, improving despite episode of hematuria  See #8  Hemoglobin 11.4 on 6/30, up to 12.1 on 7/6 13.  Transaminitis, may be elevated due to statin monitor  LFTs elevated on 6/25   LOS: 13 days A FACE TO [place] E Kristyl [place] 10/29/2018, 8:40 AM

## 2018-10-29 NOTE — Progress Notes (Signed)
Social Work Patient ID: [name], male   DOB: [birth year], 78 y.o.   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  A walker  will not resolve issue with performing activities of daily living.  A wheelchair will allow patient to safely perform daily activities.  Patient is not able to propel themselves in the home using a standard weight wheelchair due to  Endurance and fatigue .  Patient can self propel in the lightweight wheelchair.

## 2018-10-30 ENCOUNTER — Inpatient Hospital Stay (HOSPITAL_COMMUNITY): Payer: Medicare Other | Admitting: Physical Therapy

## 2018-10-30 ENCOUNTER — Inpatient Hospital Stay (HOSPITAL_COMMUNITY): Payer: Medicare Other | Admitting: Occupational Therapy

## 2018-10-30 NOTE — Progress Notes (Signed)
Physical Therapy Session Note  Patient Details  Name: ANIEL HUBBLE MRN: [id number] Date of Birth: Sep 27, 1940  Today's Date: 10/30/2018 PT Individual Time: 0950-1030 PT Individual Time Calculation (min): 40 min   Short Term Goals: Week 2:  PT Short Term Goal 1 (Week 2): STG=LTG due to ELOS  Skilled Therapeutic Interventions/Progress Updates: Pt presented in w/c agreeable to therapy. Session focused on BLE strengthening in sitting and standing. Pt propelled to day room and performed ambulatory transfer to mat with RW and close S. Pt performed STS x 5 no AD. Performed LAQ x 10 bilaterally, standing SLR x 6 bilaterally, and hip abd/add x 5 bilaterally. Pt fearful of performing hip abd/add as stating "my leg is going to give out" however no instability noted. PTA provided ongoing education on continuing activities for strengthening for improvement of ambulation and stairs upon d/c. Pt ambulated approx 23 ft with RW and additional 10 ft after seated rest with CGA and intermittent pauses. Pt transported remaining distance to room and left with call bell within reach and current needs met.      Therapy Documentation Precautions:  Precautions Precautions: Fall Precaution Comments: Sore Lt ribs Restrictions Weight Bearing Restrictions: No    Therapy/Group: Individual Therapy  Terius Jacuinde  Pacen Watford, PTA  10/30/2018, 12:42 PM

## 2018-10-30 NOTE — Progress Notes (Signed)
Social Work Patient ID: [name], male   DOB: 05/08/1940, 78 y.o.   MRN: [id number] Met with pt to discuss team conference and on track for discharge Sat with goals of supervision level. Discussed home health and equipment he does want a rollator and will pay for it. Will work toward discharge Sat.

## 2018-10-30 NOTE — Progress Notes (Signed)
Occupational Therapy Session Note  Patient Details  Name: [name] MRN: [id number] Date of Birth: 12/28/1940  Today's Date: 10/30/2018 OT Individual Time: 8786-7672 OT Individual Time Calculation (min): 70 min    Short Term Goals: Week 2:  OT Short Term Goal 1 (Week 2): STGs=LTGs due to ELOS  Skilled Therapeutic Interventions/Progress Updates:    Upon entering the room, pt seated in wheelchair awaiting OT arrival. Pt agreeable to OT intervention. Pt requesting to perform UB dressing and grooming at sink. He performed with overall supervision. Pt verbalized need for BM. Pt transferred from wheelchair > commode with min guard for balance. Pt unable to have BM this session. While seated on commode, pt emptying foley bag again with max cuing for technique. Pt given LH reacher and given education on how to thread foley through R pant leg and then use reach to thread over B feet. Pt standing with min guard and pulling pants over B hips. Pt returning to wheelchair and donning B shoes with use of shoe horn and shoe buttons. Pt remained in wheelchair with call bell and all needed items within reach.   Therapy Documentation Precautions:  Precautions Precautions: Fall Precaution Comments: Sore Lt ribs Restrictions Weight Bearing Restrictions: No   Pain: Pain Assessment Pain Score: 0-No pain   Therapy/Group: Individual Therapy  Gypsy Decant 10/30/2018, 12:55 PM

## 2018-10-30 NOTE — Patient Care Conference (Signed)
Inpatient RehabilitationTeam Conference and Plan of Care Update Date: 10/30/2018   Time: 10:40 AM    Patient Name: [name]      Medical Record Number: [id number]  Date of Birth: 01/09/1941 Sex: Male         Room/Bed: 4W24C/4W24C-01 Payor Info: Payor: [employer] MEDICARE / [facility] / Product Type: *No Product type* /    Admitting Diagnosis: 3. CVA 1 Team  Debility, malaise, 13-16 days  Admit Date/Time:  10/16/2018  4:42 PM Admission Comments: No comment available   Primary Diagnosis:  Debility Principal Problem: Debility  Patient Active Problem List   Diagnosis Date Noted  . Knee pain, right   . Pulmonary embolus (HCC)   . Transaminitis   . Acute blood loss anemia   . Debility 10/16/2018  . Benign prostatic hyperplasia with urinary retention   . Resting tremor   . Slow transit constipation   . Generalized edema   . 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 Hyponatremia 10/11/2018  . Hematuria 10/11/2018  . Hypertension   . Tremors of nervous system   . Vertigo   . Thyroid nodule   . Arrhythmia   . TIA (transient ischemic attack) 03/15/2018  . Onychomycosis 12/05/2016  . Skin ulcer of second toe of right foot, limited to breakdown of skin (HCC) 12/05/2016    Expected Discharge Date: Expected Discharge Date: 11/02/18  Team Members Present: Physician leading conference: Dr. Claudette LawsAndrew Kirsteins Social Worker Present: Dossie DerBecky Margie Brink, LCSW Nurse Present: Willey BladeKaren Winter, RN PT Present: Grier RocherAustin Tucker, PT;Rosita Dechalus, PTA OT Present: Jackquline DenmarkKatie Bradsher, OT SLP Present: Feliberto Gottronourtney Payne, SLP PPS Coordinator present : Fae PippinMelissa Bowie, SLP     Current Status/Progress Goal Weekly Team Focus  Medical   Patient still requiring Foley catheter and will be discharged with this.  Had episode of clot.  Now getting flushes  Maintain medical stability, reduce fall risk  Teach patient Foley management including emptying Foley bag   Bowel/Bladder   continent of  bowel, uretheral catheter in place; LBM: 07/07  remain continent of bowel; maintain regular bowel pattern  assist with tolieting needs prn   Swallow/Nutrition/ Hydration             ADL's   supervision.set up UB self care, min guard for toileting and functional transfer s with RW. Min A for LB self care with use of AE  mod I for grooming, supervision overall  self care retraining, endurance, balance, functional mobility/transfers, d/c education   Mobility   supervision bed mobility, close S STS from w/c, x 2 steps CGA with B rails and heavy use of BUE, gait with RW up to 5250ft CGA  supervision assist  BLE strengthening, standing tolerance, gait, endurance   Communication             Safety/Cognition/ Behavioral Observations            Pain   c/o pain to bil LE; PRN tylenol and zanaflex  pain level <3/10  assess pain level QS and prn   Skin   non-pressure wound to R toe; lidocaine to toe per orders  remain free of new skin breakdown/infection  assess skin QS and prn      *See Care Plan and progress notes for long and short-term goals.     Barriers to Discharge  Current Status/Progress Possible Resolutions Date Resolved   Physician    Medical stability;Decreased caregiver support     Progressing towards goals  See above,  will need home health nursing to assist with Foley management      Nursing                  PT                    OT                  SLP                SW                Discharge Planning/Teaching Needs:  HOme with wife who will be there, but can not provide assist. Pt is very talkative and at times limits him in therapies.      Team Discussion:  Progressing toward his goals of supervision level. CGA for steps. Working on B-LE strengthening at times flucuates in his levels due to knee pain. Home with foley and will follow up with urology. Distracts himself with all of his talking.   Revisions to Treatment Plan:  DC 7/11    Continued Need for Acute  Rehabilitation Level of Care: The patient requires daily medical management by a physician with specialized training in physical medicine and rehabilitation for the following conditions: Daily direction of a multidisciplinary physical rehabilitation program to ensure safe treatment while eliciting the highest outcome that is of practical value to the patient.: Yes Daily medical management of patient stability for increased activity during participation in an intensive rehabilitation regime.: Yes Daily analysis of laboratory values and/or radiology reports with any subsequent need for medication adjustment of medical intervention for : Other;Urological problems   I attest that I was present, lead the team conference, and concur with the assessment and plan of the team. Teleconference held due to COVID 19   Kambra Beachem, Gardiner Rhyme 10/30/2018, 1:50 PM

## 2018-10-30 NOTE — Progress Notes (Signed)
Physical Therapy Session Note  Patient Details  Name: [name] MRN: [id number] Date of Birth: [birth year]  Today's Date: 10/30/2018 PT Individual Time: 1415-1525 PT Individual Time Calculation (min): 70 min   Short Term Goals: Week 1:  PT Short Term Goal 1 (Week 1): Pt will perform bed<>WC transfer with min assist PT Short Term Goal 1 - Progress (Week 1): Met PT Short Term Goal 2 (Week 1): Pt will ambulate 38f with min assist PT Short Term Goal 2 - Progress (Week 1): Met PT Short Term Goal 3 (Week 1): Pt will maintain standing balance x 2 minute with min assist PT Short Term Goal 3 - Progress (Week 1): Met Week 2:  PT Short Term Goal 1 (Week 2): STG=LTG due to ELOS  Skilled Therapeutic Interventions/Progress Updates:   Pt received sitting in WC and agreeable to PT. WC mobility instructed by PT x [id number] [id number] [id number] supervision assist and min cues for safety through doorways.    PT instructed pt in gait training with transfer training with Rollator of various widths. Moderate multimodal cues for safety with transfers, brake management, UE placement and awareness of catheter bag. Performed sit<>stand with rollator [facility]  With supervision assist. Also completed sit<>stand from rollator with min assist for safety and pain control as pt reports penial pain into low seat height of rollator. Gait with rollator 2 x 25ft15fh close supervision assist moderate cues for posture, brake management to improve stability with stance on the RLE, and improved step length as tolerated.   Kinetron BLE endurance training 4 x 1.5 min with prolonged therapeutic rest breaks between bouts. Cues for improved force of movement to increase isokinetic resistance.    [facility] returned to [facility] and [facility] and all needs met.          Therapy Documentation Precautions:  Precautions Precautions: Fall Precaution Comments: Sore Lt ribs Restrictions Weight  Bearing Restrictions: No    Vital Signs: Therapy Vitals Temp: 98.4 F (36.9 C) Temp Source: Oral Pulse Rate: 80 Resp: 16 BP: 100/63 Patient Position (if appropriate): Sitting Oxygen Therapy SpO2: 95 % O2 Device: Room Air Pain:   denies   Therapy/Group: Individual Therapy  AustiLorie Phenix2020, 3:50 PM

## 2018-10-30 NOTE — Progress Notes (Signed)
[place] PHYSICAL MEDICINE & REHABILITATION PROGRESS NOTE   Subjective/Complaints:  Discussed details regarding discharge planning.  Urine has remained clear, No new issues overnight  ROS: - Penile pain, Denies CP, SOB, N/V/D  Objective:   No results found. Recent Labs    10/28/18 0957  WBC 9.0  HGB 12.1*  HCT 36.9*  PLT 295   No results for input(s): NA, K, CL, CO2, GLUCOSE, BUN, CREATININE, CALCIUM in the last 72 hours.  Intake/Output Summary (Last 24 hours) at 10/30/2018 1100 Last data filed at 10/30/2018 0254 Gross per 24 hour  Intake -  Output 1200 ml  Net -1200 ml     Physical Exam: Vital Signs Blood pressure (!) 96/59, pulse 79, temperature 98 F (36.7 C), temperature source Oral, resp. rate 18, height '6\' 3"'$  (1.905 m), weight 117.3 kg, SpO2 90 %.  Constitutional: No distress . Vital signs reviewed. HENT: Normocephalic.  Atraumatic. Eyes: EOMI.  No discharge. Cardiovascular: No JVD. Respiratory: Normal effort. GI: Non-distended. Musc: No edema or tenderness in extremities.  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  Intact. Neurologic: Alert and oriented Motor: Bilateral upper extremities: 5/5 proximal distal Bilateral lower extremities: 4+/5 proximal distal  Assessment/Plan: 1. Functional deficits secondary to Debility related to PE which require 3+ hours per day of interdisciplinary therapy in a comprehensive inpatient rehab setting.  Physiatrist is providing close team supervision and 24 hour management of active medical problems listed below.  Physiatrist and rehab team continue to assess barriers to discharge/monitor patient progress toward functional and medical goals  Care Tool:  Bathing    Body parts bathed by patient: Right arm, Left arm, Chest, Abdomen, Front perineal area, Right upper leg, Left upper leg, Right lower leg, Left lower leg, Face   Body parts bathed by helper:  Buttocks Body parts n/a: Buttocks   Bathing assist Assist Level: Minimal Assistance - Patient > 75%     Upper Body Dressing/Undressing Upper body dressing   What is the patient wearing?: Pull over shirt    Upper body assist Assist Level: Independent with assistive device    Lower Body Dressing/Undressing Lower body dressing      What is the patient wearing?: Pants     Lower body assist Assist for lower body dressing: Minimal Assistance - Patient > 75%(threading catheter)     Toileting Toileting    Toileting assist Assist for toileting: Contact Guard/Touching assist     Transfers Chair/bed transfer  Transfers assist     Chair/bed transfer assist level: Supervision/Verbal cueing     Locomotion Ambulation   Ambulation assist      Assist level: Contact Guard/Touching assist Assistive device: Walker-rolling Max distance: 10'   Walk 10 feet activity   Assist  Walk 10 feet activity did not occur: Safety/medical concerns  Assist level: Contact Guard/Touching assist Assistive device: Walker-rolling   Walk 50 feet activity   Assist Walk 50 feet with 2 turns activity did not occur: Safety/medical concerns  Assist level: Contact Guard/Touching assist Assistive device: Walker-rolling    Walk 150 feet activity   Assist Walk 150 feet activity did not occur: Safety/medical concerns         Walk 10 feet on uneven surface  activity   Assist Walk 10 feet on uneven surfaces activity did not occur: Safety/medical concerns         Wheelchair     Assist   Type of Wheelchair: Manual  Wheelchair assist level: Supervision/Verbal cueing Max wheelchair distance: 150    Wheelchair 50 feet with 2 turns activity    Assist        Assist Level: Supervision/Verbal cueing   Wheelchair 150 feet activity     Assist     Assist Level: Supervision/Verbal cueing    Medical Problem List and Plan: 1.  Debility secondary to bilateral  lower lobe pulmonary emboli/multi-medical  Continue CIR PT, OT Team conference today please see physician documentation under team conference tab, met with team face-to-face to discuss problems,progress, and goals. Formulized individual treatment plan based on medical history, underlying problem and comorbidities. 2.  Antithrombotics: Pulmonary emboli/left peroneal DVT: Eliquis             -antiplatelet therapy: N/A 3. Pain Management: Lidoderm patch, Zanaflex 2 mg every 8 hours as needed  -voltaren gel for knees  -lidocaine ointment for right 2nd toe area 4. Mood: Provide emotional support             -antipsychotic agents: N/A 5. Neuropsych: This patient is capable of making decisions on his own behalf. 6. Skin/Wound Care: Routine skin checks 7. Fluids/Electrolytes/Nutrition: Routine in and outs.  8.  Hematuria as well as history of BPH with TURP.  Continue Flomax 0.4 mg daily, Avodart 0.5 mg daily.    Keep Foley tube per Urology follow-up as outpatient plan for potential voiding trial.  Schedule appointment for urology next week urine is now clear.  He will need irrigation of his Foley on a daily basis while he is here on rehab and then at home home health can perform this twice a week or as recommended by urology after follow-up visit 9.  Resting tremors.  Continue Inderal 10.  Hypertension.  Patient on no antihypertensive medications prior to admission. Monitor with increased mobility. Vitals:   10/29/18 1940 10/30/18 0257  BP: 111/72 (!) 96/59  Pulse: 86 79  Resp: 18 18  Temp: 98.4 F (36.9 C) 98 F (36.7 C)  SpO2: 92% 90%   Controlled on 7/8 although running a little low on the systolic 11.  Constipation.  Laxative assistance, monitor for incont  Improving 12.  Acute blood loss anemia, improving despite episode of hematuria   Hemoglobin 11.4 on 6/30, up to 12.1 on 7/6 13.  Transaminitis, may be elevated due to statin monitor  LFTs elevated on 6/25   LOS: 14 days A FACE TO  FACE EVALUATION WAS PERFORMED  Charlett Blake 10/30/2018, 11:00 AM

## 2018-10-31 ENCOUNTER — Inpatient Hospital Stay (HOSPITAL_COMMUNITY): Payer: Medicare Other | Admitting: Physical Therapy

## 2018-10-31 ENCOUNTER — Inpatient Hospital Stay (HOSPITAL_COMMUNITY): Payer: Medicare Other | Admitting: Occupational Therapy

## 2018-10-31 MED ORDER — OMEGA-3-ACID ETHYL ESTERS 1 G PO CAPS: 1.0000 g | ORAL_CAPSULE | ORAL | 1 refills | Status: AC

## 2018-10-31 MED ORDER — POLYETHYLENE GLYCOL 3350 17 G PO PACK: 17.0000 g | PACK | Freq: Every day | ORAL | 0 refills | Status: AC

## 2018-10-31 MED ORDER — LIDOCAINE 5 % EX PTCH: 1.0000 | MEDICATED_PATCH | CUTANEOUS | 0 refills | Status: DC

## 2018-10-31 MED ORDER — APIXABAN 5 MG PO TABS: 5.0000 mg | ORAL_TABLET | Freq: Two times a day (BID) | ORAL | 1 refills | Status: DC

## 2018-10-31 MED ORDER — TIZANIDINE HCL 2 MG PO TABS: 2.0000 mg | ORAL_TABLET | Freq: Three times a day (TID) | ORAL | 0 refills | Status: DC | PRN

## 2018-10-31 MED ORDER — DUTASTERIDE 0.5 MG PO CAPS: 0.5000 mg | ORAL_CAPSULE | Freq: Every day | ORAL | 1 refills | Status: DC

## 2018-10-31 MED ORDER — TAMSULOSIN HCL 0.4 MG PO CAPS: 0.4000 mg | ORAL_CAPSULE | Freq: Every day | ORAL | 1 refills | Status: AC

## 2018-10-31 MED ORDER — ACETAMINOPHEN 325 MG PO TABS: 650.0000 mg | ORAL_TABLET | Freq: Four times a day (QID) | ORAL | Status: AC | PRN

## 2018-10-31 MED ORDER — ATORVASTATIN CALCIUM 20 MG PO TABS: 20.0000 mg | ORAL_TABLET | Freq: Every day | ORAL | 1 refills | Status: DC

## 2018-10-31 MED ORDER — DICLOFENAC SODIUM 1 % TD GEL: 2.0000 g | Freq: Four times a day (QID) | TRANSDERMAL | 1 refills | Status: DC

## 2018-10-31 MED ORDER — PROPRANOLOL HCL 10 MG PO TABS: 10.0000 mg | ORAL_TABLET | Freq: Three times a day (TID) | ORAL | 1 refills | Status: DC

## 2018-10-31 NOTE — Progress Notes (Signed)
Occupational Therapy Session Note  Patient Details  Name: WELDEN HAUSMANN MRN: [id number] Date of Birth: 07/14/1940  Today's Date: 10/31/2018 OT Individual Time: 0800-0900 OT Individual Time Calculation (min): 60 min    Short Term Goals: Week 2:  OT Short Term Goal 1 (Week 2): STGs=LTGs due to ELOS  Skilled Therapeutic Interventions/Progress Updates:    patient in bed, ready for therapy.  Bed mobility with set up/S, SPT with RW to/from bed, toilet, w/c CS.  toileting S/set up with grab bars.  LB dressing set up and occ cues.  Foley bag management S min cues - able to empty with min cues.  Donning shoes with mod A.  He remained in w/c at close of session seated at sink completing grooming tasks independently.    Therapy Documentation Precautions:  Precautions Precautions: Fall Precaution Comments: Sore Lt ribs Restrictions Weight Bearing Restrictions: No General:   Vital Signs:   Pain: Pain Assessment Pain Scale: 0-10 Pain Score: 2  Pain Location: Penis Pain Intervention(s): Repositioned   Other Treatments:     Therapy/Group: Individual Therapy  Carlos Levering 10/31/2018, 12:10 PM

## 2018-10-31 NOTE — Progress Notes (Signed)
Physical Therapy Session Note  Patient Details  Name: [name] MRN: [id number] Date of Birth: [birth year]  Today's Date: 10/31/2018 PT Individual Time: 1105-1205 AND 1530-1630  PT Individual Time Calculation (min): 60 min and 60 min   Short Term Goals: Week 2:  PT Short Term Goal 1 (Week 2): STG=LTG due to ELOS  Skilled Therapeutic Interventions/Progress Updates:  Session 1   Pt received sitting in WC and agreeable to PT. Pt transported to rehab gym [facility]. Gait training with RW 2 x 25f with Rollator and supervision assist. Sit<>stand transfers x 8 throughout treatment with supervision assist   Standing balance/tolerance while engaged in wii bowling x 20 minutes. Pt able to maintain standing with intermittent 1-2 UE support for 17 minutes, then seated rest break required.   Patient returned to room with WC with BUE propulsion x 2067fand distant supervision assist. [facility] reach and all needs met.     Session 2.   Pt received sitting in WC and agreeable to PT  WC mobility x [id number] RN station + [id number] day room with supervision assist. For turning technique to reduce turn radius.  Stair management training with BUE support on Rails and min assist x 3 steps. Cues for proper UE placement and posture to improve stability and safety.   Stand pivot transfers x 5 throughout treatment with supervision assist and RW. Nustep reciprocal endurance training x 8 minutes with cues for proper speed and to remain in pain free ROM.   Gait training with Rollator x [id number] supervision assist fopr safety. Pt noted to lock wheels with LE adavancement to improve safety an stability with SLS.   Patient returned to room and [facility] in reach and all needs met.          Therapy Documentation Precautions:  Precautions Precautions: Fall Precaution Comments: Sore Lt ribs Restrictions Weight Bearing Restrictions: No Pain: Pain  Assessment Pain Scale: 0-10 Pain Score: 2  Pain Location: Penis Pain Intervention(s): Repositioned  Session 2. Denies at rest.      Therapy/Group: Individual Therapy  AustiLorie Phenix2020, 12:12 PM

## 2018-10-31 NOTE — Progress Notes (Signed)
[place] PHYSICAL MEDICINE & REHABILITATION PROGRESS NOTE   Subjective/Complaints:  No issues, urine draining clear, discussed discharge planning.  Has an appointment with urology on 11/05/2018 at 8 AM  ROS: - Penile pain, Denies CP, SOB, N/V/D  Objective:   No results found. No results for input(s): WBC, HGB, HCT, PLT in the last 72 hours. No results for input(s): NA, K, CL, CO2, GLUCOSE, BUN, CREATININE, CALCIUM in the last 72 hours.  Intake/Output Summary (Last 24 hours) at 10/31/2018 1207 Last data filed at 10/31/2018 0900 Gross per 24 hour  Intake 420 ml  Output 1400 ml  Net -980 ml     Physical Exam: Vital Signs Blood pressure 105/71, pulse 77, temperature 98.3 F (36.8 C), temperature source Oral, resp. rate 18, height 6\' 3"  (1.905 m), weight 117.3 kg, SpO2 91 %.  Constitutional: No distress . Vital signs reviewed. HENT: Normocephalic.  Atraumatic. Eyes: EOMI.  No discharge. Cardiovascular: No JVD. Respiratory: Normal effort. GI: Non-distended. Musc: No edema or tenderness in extremities.  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  Intact. Neurologic: Alert and oriented Motor: Bilateral upper extremities: 5/5 proximal distal Bilateral lower extremities: 4+/5 proximal distal  Assessment/Plan: 1. Functional deficits secondary to Debility related to PE which require 3+ hours per day of interdisciplinary therapy in a comprehensive inpatient rehab setting.  Physiatrist is providing close team supervision and 24 hour management of active medical problems listed below.  Physiatrist and rehab team continue to assess barriers to discharge/monitor patient progress toward functional and medical goals  Care Tool:  Bathing    Body parts bathed by patient: Left arm, Chest, Abdomen, Face, Right arm   Body parts bathed by helper: Buttocks Body parts n/a: Buttocks   Bathing assist Assist Level:  Supervision/Verbal cueing     Upper Body Dressing/Undressing Upper body dressing   What is the patient wearing?: Pull over shirt    Upper body assist Assist Level: Independent    Lower Body Dressing/Undressing Lower body dressing      What is the patient wearing?: Pants, Underwear/pull up     Lower body assist Assist for lower body dressing: Contact Guard/Touching assist     Toileting Toileting    Toileting assist Assist for toileting: Contact Guard/Touching assist     Transfers Chair/bed transfer  Transfers assist     Chair/bed transfer assist level: Supervision/Verbal cueing     Locomotion Ambulation   Ambulation assist      Assist level: Contact Guard/Touching assist Assistive device: Walker-rolling Max distance: 10'   Walk 10 feet activity   Assist  Walk 10 feet activity did not occur: Safety/medical concerns  Assist level: Contact Guard/Touching assist Assistive device: Walker-rolling   Walk 50 feet activity   Assist Walk 50 feet with 2 turns activity did not occur: Safety/medical concerns  Assist level: Contact Guard/Touching assist Assistive device: Walker-rolling    Walk 150 feet activity   Assist Walk 150 feet activity did not occur: Safety/medical concerns         Walk 10 feet on uneven surface  activity   Assist Walk 10 feet on uneven surfaces activity did not occur: Safety/medical concerns         Wheelchair     Assist   Type of Wheelchair: Manual    Wheelchair assist level: Supervision/Verbal cueing Max wheelchair distance: 150    Wheelchair 50 feet with 2 turns activity    Assist  Assist Level: Supervision/Verbal cueing   Wheelchair 150 feet activity     Assist     Assist Level: Supervision/Verbal cueing    Medical Problem List and Plan: 1.  Debility secondary to bilateral lower lobe pulmonary emboli/multi-medical  Continue CIR PT, OT Ontrack for discharge on 11/02/2018 2.   Antithrombotics: Pulmonary emboli/left peroneal DVT: Eliquis             -antiplatelet therapy: N/A 3. Pain Management: Lidoderm patch, Zanaflex 2 mg every 8 hours as needed  -voltaren gel for knees  -lidocaine ointment for right 2nd toe area 4. Mood: Provide emotional support             -antipsychotic agents: N/A 5. Neuropsych: This patient is capable of making decisions on his own behalf. 6. Skin/Wound Care: Routine skin checks 7. Fluids/Electrolytes/Nutrition: Routine in and outs.  8.  Hematuria as well as history of BPH with TURP.  Continue Flomax 0.4 mg daily, Avodart 0.5 mg daily.    Keep Foley tube per Urology follow-up as outpatient plan for potential voiding trial.  Appointment with Dr. Mena GoesEskridge 11/05/2018 at 8 AM [facility].  9.  Resting tremors.  Continue Inderal 10.  Hypertension.  Patient on no antihypertensive medications prior to admission. Monitor with increased mobility. Vitals:   10/30/18 1958 10/31/18 0439  BP: 100/78 105/71  Pulse: 79 77  Resp: 18 18  Temp: (!) 97.3 F (36.3 C) 98.3 F (36.8 C)  SpO2: 96% 91%   Controlled on 7/9 although running a little low on the systolic 11.  Constipation.  Laxative assistance, monitor for incont  Improving 12.  Acute blood loss anemia, improving despite episode of hematuria   Hemoglobin 11.4 on 6/30, up to 12.1 on 7/6 13.  Transaminitis, may be elevated due to statin monitor  LFTs elevated on 6/25   LOS: 15 days A FACE TO FACE EVALUATION WAS PERFORMED  [name] 10/31/2018, 12:07 PM
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
## 2018-11-01 ENCOUNTER — Inpatient Hospital Stay (HOSPITAL_COMMUNITY): Payer: Medicare Other | Admitting: Physical Therapy

## 2018-11-01 ENCOUNTER — Inpatient Hospital Stay (HOSPITAL_COMMUNITY): Payer: Medicare Other | Admitting: Occupational Therapy

## 2018-11-01 NOTE — Progress Notes (Signed)
Social Work Discharge Note   The overall goal for the admission was met for: DC SAT 7/11  Discharge location: Yes-HOME WITH WIFE WHO CAN BE THERE BUT NOT ASSIST  Length of Stay: Yes-17 DAYS  Discharge activity level: Yes-SUPERVISION LEVEL  Home/community participation: Yes  Services provided included: MD, RD, PT, OT, RN, CM, TR, Pharmacy and SW  Financial Services: Private Insurance: [facility]  Follow-up services arranged: Home Health: [facility]-PT,OT, RN, DME: [facility], [facility], [facility], [place] and Patient/Family has no preference for HH/DME agencies  Comments (or additional information):PT DID WELL AND REACHED HIS GOALS OF SUPERIVISION LEVEL. HIS BUDDIES WILL TRANSPORT HIM TO HIS FOLLOW UP APPOINTMENTS.  Patient/Family verbalized understanding of follow-up arrangements: Yes  Individual responsible for coordination of the follow-up plan: SELF  Confirmed correct DME delivered: Elease Hashimoto 11/01/2018    Elease Hashimoto

## 2018-11-01 NOTE — Progress Notes (Signed)
Occupational Therapy Session Note  Patient Details  Name: [name] MRN: [id number] Date of Birth: 1941/02/21  Today's Date: 11/01/2018 OT Individual Time: 0900-1001 OT Individual Time Calculation (min): 61 min   Short Term Goals: Week 2:  OT Short Term Goal 1 (Week 2): STGs=LTGs due to ELOS  Skilled Therapeutic Interventions/Progress Updates:    Pt greeted in bed. RN present to administer morning medication. He requested to shower. Supine<sit completed with supervision and HOB elevated. He donned gripper socks while sitting EOB with min vcs, using extra wide sock aide. Stand pivot<w/c using RW completed with supervision assist, and supervision for stand pivot<elevated toilet. Pt with continent BM. Able to complete his own hygiene and manage clothing. Next transitioned to shower. Pt completed bathing while sitting on TTB with vcs for using lateral lean technique to reach buttocks. LH sponge used for washing feet. Throughout session, pt verbalizes his d/c plans for home. Very excited about tomorrow. At end of tx pt was left in care of NT to proceed with dressing.     Therapy Documentation Precautions:  Precautions Precautions: Fall Precaution Comments: Sore Lt ribs Restrictions Weight Bearing Restrictions: No Vital Signs: Therapy Vitals Temp: 97.8 F (36.6 C) Temp Source: Oral Pulse Rate: 80 Resp: 19 BP: 116/69 Patient Position (if appropriate): Sitting Oxygen Therapy SpO2: 98 % O2 Device: Room Air Pain: In groin during functional movement. Pt able to reposition himself to minimize pain   ADL:       Therapy/Group: Individual Therapy  [name] [name] 11/01/2018, 3:31 PM

## 2018-11-01 NOTE — Discharge Summary (Addendum)
Occupational Therapy Discharge Summary  Patient Details  Name: [name] MRN: [id number] Date of Birth: 1940/07/19  Patient has met 28 of 10 long term goals due to improved activity tolerance, improved balance, postural control, ability to compensate for deficits and improved coordination.  Patient to discharge at overall Supervision level.  Patient demonstrates the ability to direct his care. He reports his spouse can provide the necessary assistance at discharge.    All goals met.   Recommendation:  Patient will benefit from ongoing skilled OT services in home health setting to continue to advance functional skills in the area of BADL and iADL.  Equipment: Shower chair + BSC  Reasons for discharge: treatment goals met and discharge from hospital  Patient/family agrees with progress made and goals achieved: Yes  OT Discharge Precautions/Restrictions  Precautions Precautions: Fall Vital Signs Therapy Vitals Temp: 97.8 F (36.6 C) Temp Source: Oral Pulse Rate: 80 Resp: 19 BP: 116/69 Patient Position (if appropriate): Sitting Oxygen Therapy SpO2: 98 % O2 Device: Room Air ADL ADL Eating: Independent Where Assessed-Eating: Wheelchair Grooming: Independent Where Assessed-Grooming: Sitting at sink Upper Body Bathing: Supervision/safety Where Assessed-Upper Body Bathing: Shower Lower Body Bathing: Supervision/safety Where Assessed-Lower Body Bathing: Shower Upper Body Dressing: Independent(per most recent staff report) Lower Body Dressing: Supervision/safety(per most recent staff report) Toileting: Supervision/safety Where Assessed-Toileting: Glass blower/designer: Close supervision Toilet Transfer Method: Arts development officer: Raised toilet Print production planner: Close supervision Social research officer, government Method: Radiographer, therapeutic: Radio broadcast assistant, Grab bars Vision Vision Assessment?: No apparent visual  deficits Designer, industrial/product Overall Cognitive Status: Within Functional Limits for tasks assessed Arousal/Alertness: Awake/alert Orientation Level: Oriented X4 Memory: Appears intact Safety/Judgment: Appears intact Sensation Sensation Light Touch: Impaired Detail Peripheral sensation comments: 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 Coordination Gross Motor Movements are Fluid and Coordinated: No Fine Motor Movements are Fluid and Coordinated: No Coordination and Movement Description: Affected by LE pain and UE tremor activity Motor  Motor Motor: Other (comment) Motor - Discharge Observations: generalized weakness and tremor in BUE Mobility  Stand pivot bathroom transfers with supervision using RW or grab bars Trunk/Postural Assessment  Cervical Assessment Cervical Assessment: Exceptions to WFL(forward head) Thoracic Assessment Thoracic Assessment: Exceptions to WFL(rounded shoulders) Lumbar Assessment Lumbar Assessment: Exceptions to [facility] Postural Control Postural Control: Within Functional Limits  Balance Balance Balance Assessed: Yes Static Sitting Balance Static Sitting - Level of Assistance: 6: Modified independent (Device/Increase time) Dynamic Sitting Balance Dynamic Sitting - Level of Assistance: 6: Modified independent (donning gripper socks EOB using sock aide) Static Standing Balance Static Standing - Level of Assistance: 5: Stand by assistance(with RW) Dynamic Standing Balance Dynamic Standing - Level of Assistance: 5: Stand by assistance(toileting) Extremity/Trunk Assessment RUE Assessment RUE Assessment: Within Functional Limits LUE Assessment LUE Assessment: Within Functional Limits   Emet Rafanan A Jethro Radke 11/01/2018, 4:15 PM

## 2018-11-01 NOTE — Progress Notes (Signed)
[name] PHYSICAL MEDICINE & REHABILITATION PROGRESS NOTE   Subjective/Complaints:  The patient is quite happy about his discharge.  He feels comfortable managing his Foley. ROS: - Penile pain, Denies CP, SOB, N/V/D  Objective:   No results found. No results for input(s): WBC, HGB, HCT, PLT in the last 72 hours. No results for input(s): NA, K, CL, CO2, GLUCOSE, BUN, CREATININE, CALCIUM in the last 72 hours.  Intake/Output Summary (Last 24 hours) at 11/01/2018 0858 Last data filed at 11/01/2018 0602 Gross per 24 hour  Intake 480 ml  Output 900 ml  Net -420 ml     Physical Exam: Vital Signs Blood pressure 101/74, pulse 80, temperature 97.7 F (36.5 C), temperature source Oral, resp. rate 18, height 6\' 3"  (1.905 m), weight 117.3 kg, SpO2 94 %.  Constitutional: No distress . Vital signs reviewed. HENT: Normocephalic.  Atraumatic. Eyes: EOMI.  No discharge. Cardiovascular: No JVD. Respiratory: Normal effort. GI: Non-distended. Musc: No edema or tenderness in extremities.  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  Intact. Neurologic: Alert and oriented Motor: Bilateral upper extremities: 5/5 proximal distal Bilateral lower extremities: 4+/5 proximal distal  Assessment/Plan: 1. Functional deficits secondary to Debility related to PE which require 3+ hours per day of interdisciplinary therapy in a comprehensive inpatient rehab setting.  Physiatrist is providing close team supervision and 24 hour management of active medical problems listed below.  Physiatrist and rehab team continue to assess barriers to discharge/monitor patient progress toward functional and medical goals  Care Tool:  Bathing    Body parts bathed by patient: Left arm, Chest, Abdomen, Face, Right arm   Body parts bathed by helper: Buttocks Body parts n/a: Buttocks   Bathing assist Assist Level: Supervision/Verbal cueing     Upper  Body Dressing/Undressing Upper body dressing   What is the patient wearing?: Pull over shirt    Upper body assist Assist Level: Independent    Lower Body Dressing/Undressing Lower body dressing      What is the patient wearing?: Pants     Lower body assist Assist for lower body dressing: Supervision/Verbal cueing     Toileting Toileting    Toileting assist Assist for toileting: Contact Guard/Touching assist     Transfers Chair/bed transfer  Transfers assist     Chair/bed transfer assist level: Supervision/Verbal cueing     Locomotion Ambulation   Ambulation assist      Assist level: Supervision/Verbal cueing Assistive device: Rollator Max distance: 50   Walk 10 feet activity   Assist  Walk 10 feet activity did not occur: Safety/medical concerns  Assist level: Supervision/Verbal cueing Assistive device: Rollator   Walk 50 feet activity   Assist Walk 50 feet with 2 turns activity did not occur: Safety/medical concerns  Assist level: Supervision/Verbal cueing Assistive device: Rollator    Walk 150 feet activity   Assist Walk 150 feet activity did not occur: Safety/medical concerns         Walk 10 feet on uneven surface  activity   Assist Walk 10 feet on uneven surfaces activity did not occur: Safety/medical concerns         Wheelchair     Assist   Type of Wheelchair: Manual    Wheelchair assist level: Supervision/Verbal cueing Max wheelchair distance: 150    Wheelchair 50 feet with 2 turns activity    Assist        Assist Level: Supervision/Verbal cueing  Wheelchair 150 feet activity     Assist     Assist Level: Supervision/Verbal cueing    Medical Problem List and Plan: 1.  Debility secondary to bilateral lower lobe pulmonary emboli/multi-medical  Continue CIR PT, OT  discharge on 11/02/2018 2.  Antithrombotics: Pulmonary emboli/left peroneal DVT: Eliquis             -antiplatelet therapy: N/A 3.  Pain Management: Lidoderm patch, Zanaflex 2 mg every 8 hours as needed  -voltaren gel for knees  -lidocaine ointment for right 2nd toe area 4. Mood: Provide emotional support             -antipsychotic agents: N/A 5. Neuropsych: This patient is capable of making decisions on his own behalf. 6. Skin/Wound Care: Routine skin checks 7. Fluids/Electrolytes/Nutrition: Routine in and outs.  8.  Hematuria as well as history of BPH with TURP.  Continue Flomax 0.4 mg daily, Avodart 0.5 mg daily.    Keep Foley tube per Urology follow-up as outpatient plan for potential voiding trial.  Appointment with Dr. Mena GoesEskridge 11/05/2018 at 8 AM [facility].  9.  Resting tremors.  Continue Inderal 10.  Hypertension.  Patient on no antihypertensive medications prior to admission. Monitor with increased mobility. Vitals:   10/31/18 1958 11/01/18 0551  BP: 107/68 101/74  Pulse: 75 80  Resp: 18 18  Temp: 97.7 F (36.5 C) 97.7 F (36.5 C)  SpO2: 95% 94%   Controlled on 7/10 although running a little low on the systolic, no dizziness 11.  Constipation.  Laxative assistance, monitor for incont  Improving 12.  Acute blood loss anemia, improving despite episode of hematuria   Hemoglobin 11.4 on 6/30, up to 12.1 on 7/6 13.  Transaminitis, may be elevated due to statin monitor  LFTs elevated on 6/25   LOS: 16 days A FACE TO FACE EVALUATION WAS PERFORMED  Erick Colacendrew E Voncille Simm 11/01/2018, 8:58 AM

## 2018-11-01 NOTE — Progress Notes (Signed)
Physical Therapy Discharge Summary  Patient Details  Name: [name] MRN: [id number] Date of Birth: 1941/02/12  Today's Date: 11/01/2018 PT Individual Time: 1115-1200 AND 1530-1645 PT Individual Time Calculation (min): 45 min and 75 min    Patient has met 10 of 11 long term goals due to improved activity tolerance, improved balance, increased strength, decreased pain and ability to compensate for deficits.  Patient to discharge at a wheelchair level Supervision.   Patient's care partner unavailable to provide the necessary physical assistance at discharge.  Reasons goals not met: Knee pain limits gait distance to <30f.   Recommendation:  Patient will benefit from ongoing skilled PT services in home health setting to continue to advance safe functional mobility, address ongoing impairments in balance, safety, gait, transfers, pain control, strength, and minimize fall risk.  Equipment: RW, WC, Rollator  Reasons for discharge: treatment goals met and discharge from hospital  Patient/family agrees with progress made and goals achieved: Yes   PT Treatment:  Session 1.   Pt received sitting in WC and agreeable to PT.  Car transfer with supervision assist and RW. WC mobility 2 x [id number] cues or assist from PT. Stair management with BUE support x 3 as listed below with supervision assist. Gait training with Rollator x [id number] supervision assist from PT. Patient returned to room and [facility] reach and all needs met.    Session 2.  Pt received sitting in WC and agreeable to PT. WC mobility instructed by PT 3 x [id number] instructed pt in Grad day assessment to measure progress toward goals. See below for details. PT instructed pt in HEP of modified [place] with hand out provided. PT instructed pt in dynamic standing balance x 12 minutes while engaged in fine motor task of Wii Bowling. Supervision assist from PT for safety and intermittent 1-2 UE  support Patient returned to room and left sitting in [facility] in reach and all needs met.        PT Discharge Precautions/Restrictions   fall. Foley Vital Signs Therapy Vitals Temp: 97.8 F (36.6 C) Temp Source: Oral Pulse Rate: 80 Resp: 19 BP: 116/69 Patient Position (if appropriate): Sitting Oxygen Therapy SpO2: 98 % O2 Device: Room Air Pain   denies at rest.  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 Coordination Gross Motor Movements are Fluid and Coordinated: No Fine Motor Movements are Fluid and Coordinated: No Coordination and Movement Description: tremor in BUE L>R Motor  Motor Motor: Other (comment) Motor - Discharge Observations: generalized weakness and tremor in BUE  Mobility Bed Mobility Bed Mobility: Rolling Right;Rolling Left;Supine to Sit;Sit to Supine Rolling Right: Independent with assistive device Rolling Left: Independent with assistive device Supine to Sit: Independent with assistive device Sit to Supine: Independent with assistive device Transfers Transfers: Sit to Stand;Stand to Sit;Stand Pivot Transfers Sit to Stand: Supervision/Verbal cueing Stand to Sit: Supervision/Verbal cueing Stand Pivot Transfers: Supervision/Verbal cueing Transfer (Assistive device): Rollator Locomotion  Gait Gait Assistance: Supervision/Verbal cueing Gait Distance (Feet): 58 Feet Assistive device: Rollator Gait Gait: Yes Gait Pattern: Trunk flexed;Right flexed knee in stance;Left flexed knee in stance Stairs / Additional Locomotion Stairs: Yes Stairs Assistance: Supervision/Verbal cueing Stair Management Technique: Two rails Number of Stairs: 3 Height of Stairs: 6 Wheelchair Mobility Wheelchair Mobility: Yes Wheelchair Assistance: Supervision/Verbal cueing Wheelchair Parts Management: Independent Distance:  241f   Trunk/Postural Assessment  Cervical Assessment Cervical Assessment: Exceptions to WFL(forward head) Thoracic Assessment Thoracic Assessment: Exceptions to WFL(rounded shoulders) Lumbar Assessment Lumbar Assessment: Exceptions to WCyp[facility] Behavioral Health LLCPostural Control Postural Control: Within Functional Limits  Balance Balance Balance Assessed: Yes Static Sitting Balance Static Sitting - Level of Assistance: 6: Modified independent (Device/Increase time) Dynamic Sitting Balance Dynamic Sitting - Level of Assistance: 6: Modified independent (Device/Increase time)(donning gripper socks EOB) Static Standing Balance Static Standing - Level of Assistance: 5: Stand by assistance(with RW) Dynamic Standing Balance Dynamic Standing - Level of Assistance: 5: Stand by assistance(toileting)  Extremity Assessment      RLE Assessment RLE Assessment: Exceptions to [facility]awakaActive Range of Motion (AROM) Comments: lacking ~10 deg full extension General Strength Comments: grossly 4/5 with pain in knee LLE Assessment LLE Assessment: Exceptions to WDig[facility] Of PlanoActive Range of Motion (AROM) Comments: lackin 5 deg full extension General Strength Comments: grossly 4/5 with limited knee ROM    ALorie Phenix7/01/2019, 4:00 PM

## 2018-11-01 NOTE — Plan of Care (Signed)
  Problem: RH Balance Goal: LTG: Patient will maintain dynamic sitting balance (OT) Description: LTG:  Patient will maintain dynamic sitting balance with assistance during activities of daily living (OT) Outcome: Completed/Met Goal: LTG Patient will maintain dynamic standing with ADLs (OT) Description: LTG:  Patient will maintain dynamic standing balance with assist during activities of daily living (OT)  Outcome: Completed/Met   Problem: Sit to Stand Goal: LTG:  Patient will perform sit to stand in prep for activites of daily living with assistance level (OT) Description: LTG:  Patient will perform sit to stand in prep for activites of daily living with assistance level (OT) Outcome: Completed/Met   Problem: RH Grooming Goal: LTG Patient will perform grooming w/assist,cues/equip (OT) Description: LTG: Patient will perform grooming with assist, with/without cues using equipment (OT) Outcome: Completed/Met   Problem: RH Bathing Goal: LTG Patient will bathe all body parts with assist levels (OT) Description: LTG: Patient will bathe all body parts with assist levels (OT) Outcome: Completed/Met   Problem: RH Dressing Goal: LTG Patient will perform upper body dressing (OT) Description: LTG Patient will perform upper body dressing with assist, with/without cues (OT). Outcome: Completed/Met Goal: LTG Patient will perform lower body dressing w/assist (OT) Description: LTG: Patient will perform lower body dressing with assist, with/without cues in positioning using equipment (OT) Outcome: Completed/Met   Problem: RH Toileting Goal: LTG Patient will perform toileting task (3/3 steps) with assistance level (OT) Description: LTG: Patient will perform toileting task (3/3 steps) with assistance level (OT)  Outcome: Completed/Met   Problem: RH Toilet Transfers Goal: LTG Patient will perform toilet transfers w/assist (OT) Description: LTG: Patient will perform toilet transfers with assist,  with/without cues using equipment (OT) Outcome: Completed/Met   Problem: RH Tub/Shower Transfers Goal: LTG Patient will perform tub/shower transfers w/assist (OT) Description: LTG: Patient will perform tub/shower transfers with assist, with/without cues using equipment (OT) Outcome: Completed/Met   

## 2018-11-02 NOTE — Progress Notes (Signed)
[place] PHYSICAL MEDICINE & REHABILITATION PROGRESS NOTE   Subjective/Complaints: Patient seen sitting up this morning.  He states he slept well overnight.  He states he is ready for discharge.  He reviews discharge appointments with me.  ROS: Denies CP, SOB, N/V/D  Objective:   No results found. No results for input(s): WBC, HGB, HCT, PLT in the last 72 hours. No results for input(s): NA, K, CL, CO2, GLUCOSE, BUN, CREATININE, CALCIUM in the last 72 hours.  Intake/Output Summary (Last 24 hours) at 11/02/2018 1204 Last data filed at 11/02/2018 0743 Gross per 24 hour  Intake 680 ml  Output 1600 ml  Net -920 ml     Physical Exam: Vital Signs Blood pressure 107/72, pulse 75, temperature 97.7 F (36.5 C), temperature source Oral, resp. rate 18, height 6\' 3"  (1.905 m), weight 117.3 kg, SpO2 93 %.  Constitutional: No distress . Vital signs reviewed. HENT: Normocephalic.  Atraumatic. Eyes: EOMI.  No discharge. Cardiovascular: No JVD. Respiratory: Normal effort. GI: Non-distended. Musc: No edema or tenderness in extremities.  Amputated digits on lower extremity Uro: Blood tinged in foley bag, rose colored  Skin: Warm and dry.  Intact. Neurologic: Alert and oriented Motor: Bilateral upper extremities: 5/5 proximal distal Bilateral lower extremities: 4+/5 proximal distal  Assessment/Plan: 1. Functional deficits secondary to Debility related to PE which require 3+ hours per day of interdisciplinary therapy in a comprehensive inpatient rehab setting.  Physiatrist is providing close team supervision and 24 hour management of active medical problems listed below.  Physiatrist and rehab team continue to assess barriers to discharge/monitor patient progress toward functional and medical goals  Care Tool:  Bathing    Body parts bathed by patient: Left arm, Chest, Abdomen, Face, Right arm, Front perineal area, Buttocks, Right upper leg, Left upper leg, Right lower leg, Left lower  leg   Body parts bathed by helper: Buttocks Body parts n/a: Buttocks   Bathing assist Assist Level: Supervision/Verbal cueing     Upper Body Dressing/Undressing Upper body dressing   What is the patient wearing?: Pull over shirt    Upper body assist Assist Level: Independent(per most recent staff report)    Lower Body Dressing/Undressing Lower body dressing      What is the patient wearing?: Pants     Lower body assist Assist for lower body dressing: Supervision/Verbal cueing(per most recent staff report)     Toileting Toileting    Toileting assist Assist for toileting: Supervision/Verbal cueing     Transfers Chair/bed transfer  Transfers assist     Chair/bed transfer assist level: Supervision/Verbal cueing     Locomotion Ambulation   Ambulation assist      Assist level: Supervision/Verbal cueing Assistive device: Rollator Max distance: 58   Walk 10 feet activity   Assist  Walk 10 feet activity did not occur: Safety/medical concerns  Assist level: Supervision/Verbal cueing Assistive device: Rollator   Walk 50 feet activity   Assist Walk 50 feet with 2 turns activity did not occur: Safety/medical concerns  Assist level: Supervision/Verbal cueing Assistive device: Rollator    Walk 150 feet activity   Assist Walk 150 feet activity did not occur: Safety/medical concerns  Assist level: Supervision/Verbal cueing Assistive device: Rollator    Walk 10 feet on uneven surface  activity   Assist Walk 10 feet on uneven surfaces activity did not occur: Refused         Wheelchair     Assist Will patient use wheelchair at discharge?: Yes Type of Wheelchair:  Manual    Wheelchair assist level: Independent Max wheelchair distance: 150    Wheelchair 50 feet with 2 turns activity    Assist        Assist Level: Independent   Wheelchair 150 feet activity     Assist     Assist Level: Independent    Medical Problem  List and Plan: 1.  Debility secondary to bilateral lower lobe pulmonary emboli/multi-medical  DC today 2.  Antithrombotics: Pulmonary emboli/left peroneal DVT: Eliquis             -antiplatelet therapy: N/A 3. Pain Management: Lidoderm patch, Zanaflex 2 mg every 8 hours as needed  -voltaren gel for knees  -lidocaine ointment for right 2nd toe area 4. Mood: Provide emotional support             -antipsychotic agents: N/A 5. Neuropsych: This patient is capable of making decisions on his own behalf. 6. Skin/Wound Care: Routine skin checks 7. Fluids/Electrolytes/Nutrition: Routine in and outs.  8.  Hematuria as well as history of BPH with TURP.  Continue Flomax 0.4 mg daily, Avodart 0.5 mg daily.    Continue Foley at discharge.  Urology follow-up as outpatient plan for potential voiding trial.  Appointment with Dr. Mena GoesEskridge 11/05/2018 at 8 AM [facility]. 9.  Resting tremors.  Continue Inderal 10.  Hypertension.  Patient on no antihypertensive medications prior to admission. Monitor with increased mobility. Vitals:   11/01/18 2153 11/02/18 0559  BP: 111/69 107/72  Pulse: 91 75  Resp: 16 18  Temp: 97.8 F (36.6 C) 97.7 F (36.5 C)  SpO2: 96% 93%   Controlled on 7/11 11.  Constipation.  Laxative assistance, monitor for incont  Improved 12.  Acute blood loss anemia, improving despite episode of hematuria  Hemoglobin 12.1 on 7/6 13.  Transaminitis, may be elevated due to statin monitor  LFTs elevated on 6/25   LOS: 17 days A FACE TO FACE EVALUATION WAS PERFORMED  Emillee Talsma Karis Jubanil Cher Franzoni 11/02/2018, 12:04 PM

## 2018-11-02 NOTE — Plan of Care (Signed)
  Problem: Consults Goal: RH GENERAL PATIENT EDUCATION Description: See Patient Education module for education specifics. Outcome: Completed/Met   Problem: RH BOWEL ELIMINATION Goal: RH STG MANAGE BOWEL WITH ASSISTANCE Description: STG Manage Bowel with Mod I Assistance. Outcome: Completed/Met Goal: RH STG MANAGE BOWEL W/MEDICATION W/ASSISTANCE Description: STG Manage Bowel with Medication with Mod I Assistance. Outcome: Completed/Met   Problem: RH BLADDER ELIMINATION Goal: RH STG MANAGE BLADDER WITH MEDICATION WITH ASSISTANCE Description: STG Manage Bladder With Medication With Mod I Assistance. Outcome: Completed/Met Goal: RH STG MANAGE BLADDER WITH EQUIPMENT WITH ASSISTANCE Description: STG Manage Bladder With Equipment With No Assistance Outcome: Completed/Met   Problem: RH SKIN INTEGRITY Goal: RH STG MAINTAIN SKIN INTEGRITY WITH ASSISTANCE Description: STG Maintain Skin Integrity With [place]. Outcome: Completed/Met Goal: RH STG ABLE TO PERFORM INCISION/WOUND CARE W/ASSISTANCE Description: STG Able To Perform Incision/Wound Care With Assistance. Outcome: Completed/Met   Problem: RH SAFETY Goal: RH STG ADHERE TO SAFETY PRECAUTIONS W/ASSISTANCE/DEVICE Description: STG Adhere to Safety Precautions With cues/reminders. Outcome: Completed/Met   Problem: RH PAIN MANAGEMENT Goal: RH STG PAIN MANAGED AT OR BELOW PT'S PAIN GOAL Outcome: Completed/Met   Problem: RH KNOWLEDGE DEFICIT GENERAL Goal: RH STG INCREASE KNOWLEDGE OF SELF CARE AFTER HOSPITALIZATION Outcome: Completed/Met

## 2018-11-02 NOTE — Progress Notes (Signed)
Patient discharged at 1040. Patient discharged home with all equipment and d/c instructions. Patient able to verbalize foley care instructions and this nurse went over medication again with patient. All questions answered. Patient discharged home with friend.

## 2018-11-26 ENCOUNTER — Telehealth: Payer: Self-pay | Admitting: Physical Medicine & Rehabilitation

## 2018-11-26 NOTE — Telephone Encounter (Signed)
PT called wanting to make a follow up appointment. He stated Dr, Letta Pate told him after he got home from rehab to call and schedule. The discharge orders does not show that. Should we schedule him an appointment?

## 2018-12-03 ENCOUNTER — Telehealth: Payer: Self-pay

## 2018-12-03 NOTE — Telephone Encounter (Signed)
Patient called wanting to know next steps after having a recent hospital discharge and a recent DVT study.  Noted on his hospital discharge note that he was listed as follow up only as needed.  Informed patient that he needs to call his primary care provider on what to do next for his DVT's.[name]

## 2019-12-26 ENCOUNTER — Other Ambulatory Visit: Payer: Self-pay | Admitting: Nurse Practitioner

## 2019-12-26 DIAGNOSIS — Z86718 Personal history of other venous thrombosis and embolism: Secondary | ICD-10-CM

## 2019-12-30 ENCOUNTER — Other Ambulatory Visit: Payer: Medicare Other

## 2020-01-06 ENCOUNTER — Ambulatory Visit
Admission: RE | Admit: 2020-01-06 | Discharge: 2020-01-06 | Disposition: A | Discharge status: Home / Self Care | Payer: Medicare Other | Source: Ambulatory Visit | Attending: Nurse Practitioner | Admitting: Nurse Practitioner

## 2020-01-06 ENCOUNTER — Other Ambulatory Visit: Payer: Self-pay

## 2020-01-06 DIAGNOSIS — Z86718 Personal history of other venous thrombosis and embolism: Secondary | ICD-10-CM

## 2020-02-03 ENCOUNTER — Other Ambulatory Visit: Payer: Self-pay

## 2020-02-03 ENCOUNTER — Ambulatory Visit (INDEPENDENT_AMBULATORY_CARE_PROVIDER_SITE_OTHER): Payer: Medicare Other

## 2020-02-03 ENCOUNTER — Ambulatory Visit: Payer: Medicare Other | Admitting: Podiatry

## 2020-02-03 DIAGNOSIS — L97503 Non-pressure chronic ulcer of other part of unspecified foot with necrosis of muscle: Secondary | ICD-10-CM

## 2020-02-03 DIAGNOSIS — Z89421 Acquired absence of other right toe(s): Secondary | ICD-10-CM | POA: Diagnosis not present

## 2020-02-03 DIAGNOSIS — M2041 Other hammer toe(s) (acquired), right foot: Secondary | ICD-10-CM | POA: Diagnosis not present

## 2020-02-03 DIAGNOSIS — L84 Corns and callosities: Secondary | ICD-10-CM | POA: Diagnosis not present

## 2020-02-03 DIAGNOSIS — B351 Tinea unguium: Secondary | ICD-10-CM

## 2020-02-03 DIAGNOSIS — M79675 Pain in left toe(s): Secondary | ICD-10-CM

## 2020-02-03 DIAGNOSIS — L97518 Non-pressure chronic ulcer of other part of right foot with other specified severity: Secondary | ICD-10-CM | POA: Diagnosis not present

## 2020-02-03 DIAGNOSIS — M79674 Pain in right toe(s): Secondary | ICD-10-CM
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
## 2020-02-17 ENCOUNTER — Ambulatory Visit: Payer: Medicare Other | Admitting: Podiatry
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
## 2020-03-01 ENCOUNTER — Ambulatory Visit: Payer: Medicare Other | Admitting: Podiatry

## 2020-03-01 ENCOUNTER — Encounter: Payer: Self-pay | Admitting: Podiatry

## 2020-03-01 ENCOUNTER — Other Ambulatory Visit: Payer: Self-pay

## 2020-03-01 DIAGNOSIS — Z89421 Acquired absence of other right toe(s): Secondary | ICD-10-CM | POA: Diagnosis not present

## 2020-03-01 DIAGNOSIS — L84 Corns and callosities: Secondary | ICD-10-CM | POA: Diagnosis not present

## 2020-03-01 DIAGNOSIS — M2041 Other hammer toe(s) (acquired), right foot: Secondary | ICD-10-CM

## 2020-03-03 NOTE — Progress Notes (Signed)
  Subjective:  Patient ID: [name], male    DOB: [birth year],  MRN: [id number]  Chief Complaint  Patient presents with  . Callouses    callous trim right 2nd toe    79 y.o. male returns with the above complaint. History confirmed with patient. Wound is doing well and is healed  Objective:  Physical Exam: warm, good capillary refill and normal DP and PT pulses.  Previous amputated right fourth and fifth toes.  Wound remains healed. Onychomycosis x7 Assessment:   No diagnosis found.   Plan:  Patient was evaluated and treated and all questions answered.  Patient educated on diabetes. Discussed proper diabetic foot care and discussed risks and complications of disease. Educated patient in depth on reasons to return to the office immediately should he/she discover anything concerning or new on the feet. All questions answered. Discussed proper shoes as well.    Ulcer is healed and there is no underlying open lesion.  I recommend that he continue his aquatic therapy.  Return in about 3 months (around 06/01/2020).

## 2020-07-20 ENCOUNTER — Other Ambulatory Visit: Payer: Self-pay

## 2020-07-20 ENCOUNTER — Ambulatory Visit: Payer: Medicare Other | Admitting: Podiatry

## 2020-07-20 DIAGNOSIS — M79675 Pain in left toe(s): Secondary | ICD-10-CM

## 2020-07-20 DIAGNOSIS — L84 Corns and callosities: Secondary | ICD-10-CM

## 2020-07-20 DIAGNOSIS — M2041 Other hammer toe(s) (acquired), right foot: Secondary | ICD-10-CM

## 2020-07-20 DIAGNOSIS — B351 Tinea unguium: Secondary | ICD-10-CM

## 2020-07-20 DIAGNOSIS — M79674 Pain in right toe(s): Secondary | ICD-10-CM

## 2020-07-20 DIAGNOSIS — Z89421 Acquired absence of other right toe(s): Secondary | ICD-10-CM

## 2020-07-20 NOTE — Progress Notes (Signed)
  Subjective:  Patient ID: [name], male    DOB: 11-25-1940,  MRN: [id number]  Chief Complaint  Patient presents with  . Callouses    2nd toe callus     80 y.o. male returns with the above complaint. History confirmed with patient. Wound is doing well and is healed  Objective:  Physical Exam: warm, good capillary refill and normal DP and PT pulses.  Previous amputated right fourth and fifth toes.  Callus present right third toe. Onychomycosis x7 Assessment:   1. Hammertoe of right foot   2. History of amputation of lesser toe of right foot (HCC)   3. Pain due to onychomycosis of toenails of both feet   4. Pre-ulcerative calluses      Plan:  Patient was evaluated and treated and all questions answered.  Patient educated on diabetes. Discussed proper diabetic foot care and discussed risks and complications of disease. Educated patient in depth on reasons to return to the office immediately should he/she discover anything concerning or new on the feet. All questions answered. Discussed proper shoes as well.   Discussed the etiology and treatment options for the condition in detail with the patient. Educated patient on the topical and oral treatment options for mycotic nails. Recommended debridement of the nails today. Sharp and mechanical debridement performed of all painful and mycotic nails today. Nails debrided in length and thickness using a nail nipper to level of comfort. Discussed treatment options including appropriate shoe gear. Follow up as needed for painful nails.  All symptomatic hyperkeratoses were safely debrided with a sterile #15 blade to patient's level of comfort without incident. We discussed preventative and palliative care of these lesions including supportive and accommodative shoegear, padding, prefabricated and custom molded accommodative orthoses, use of a pumice stone and lotions/creams daily.  No follow-ups on file.

## 2020-08-14 ENCOUNTER — Emergency Department (HOSPITAL_COMMUNITY): Payer: Medicare Other

## 2020-08-14 ENCOUNTER — Inpatient Hospital Stay (HOSPITAL_COMMUNITY)
Admission: EM | Admit: 2020-08-14 | Discharge: 2020-08-19 | DRG: 125 | Disposition: A | Discharge status: Rehabilitation Facility | Payer: Medicare Other | Attending: Neurology | Admitting: Neurology

## 2020-08-14 ENCOUNTER — Encounter (HOSPITAL_COMMUNITY): Payer: Self-pay

## 2020-08-14 ENCOUNTER — Other Ambulatory Visit: Payer: Self-pay

## 2020-08-14 DIAGNOSIS — I6300 Cerebral infarction due to thrombosis of unspecified precerebral artery: Secondary | ICD-10-CM

## 2020-08-14 DIAGNOSIS — G8194 Hemiplegia, unspecified affecting left nondominant side: Secondary | ICD-10-CM | POA: Diagnosis not present

## 2020-08-14 DIAGNOSIS — Z79899 Other long term (current) drug therapy: Secondary | ICD-10-CM | POA: Diagnosis not present

## 2020-08-14 DIAGNOSIS — Z9842 Cataract extraction status, left eye: Secondary | ICD-10-CM

## 2020-08-14 DIAGNOSIS — M25561 Pain in right knee: Secondary | ICD-10-CM | POA: Diagnosis present

## 2020-08-14 DIAGNOSIS — Z91048 Other nonmedicinal substance allergy status: Secondary | ICD-10-CM

## 2020-08-14 DIAGNOSIS — H53462 Homonymous bilateral field defects, left side: Secondary | ICD-10-CM | POA: Diagnosis present

## 2020-08-14 DIAGNOSIS — G250 Essential tremor: Secondary | ICD-10-CM | POA: Diagnosis present

## 2020-08-14 DIAGNOSIS — H5462 Unqualified visual loss, left eye, normal vision right eye: Principal | ICD-10-CM | POA: Diagnosis present

## 2020-08-14 DIAGNOSIS — E669 Obesity, unspecified: Secondary | ICD-10-CM | POA: Diagnosis present

## 2020-08-14 DIAGNOSIS — E1165 Type 2 diabetes mellitus with hyperglycemia: Secondary | ICD-10-CM | POA: Diagnosis present

## 2020-08-14 DIAGNOSIS — Z8673 Personal history of transient ischemic attack (TIA), and cerebral infarction without residual deficits: Secondary | ICD-10-CM | POA: Diagnosis not present

## 2020-08-14 DIAGNOSIS — R531 Weakness: Secondary | ICD-10-CM | POA: Diagnosis present

## 2020-08-14 DIAGNOSIS — Z8249 Family history of ischemic heart disease and other diseases of the circulatory system: Secondary | ICD-10-CM | POA: Diagnosis not present

## 2020-08-14 DIAGNOSIS — I639 Cerebral infarction, unspecified: Secondary | ICD-10-CM | POA: Diagnosis not present

## 2020-08-14 DIAGNOSIS — Z20822 Contact with and (suspected) exposure to covid-19: Secondary | ICD-10-CM | POA: Diagnosis present

## 2020-08-14 DIAGNOSIS — Z91030 Bee allergy status: Secondary | ICD-10-CM

## 2020-08-14 DIAGNOSIS — Z7901 Long term (current) use of anticoagulants: Secondary | ICD-10-CM

## 2020-08-14 DIAGNOSIS — E785 Hyperlipidemia, unspecified: Secondary | ICD-10-CM | POA: Diagnosis present

## 2020-08-14 DIAGNOSIS — M79674 Pain in right toe(s): Secondary | ICD-10-CM | POA: Diagnosis present

## 2020-08-14 DIAGNOSIS — Z6832 Body mass index (BMI) 32.0-32.9, adult: Secondary | ICD-10-CM | POA: Diagnosis not present

## 2020-08-14 DIAGNOSIS — Z961 Presence of intraocular lens: Secondary | ICD-10-CM | POA: Diagnosis present

## 2020-08-14 DIAGNOSIS — Z886 Allergy status to analgesic agent status: Secondary | ICD-10-CM | POA: Diagnosis not present

## 2020-08-14 DIAGNOSIS — Z86711 Personal history of pulmonary embolism: Secondary | ICD-10-CM

## 2020-08-14 DIAGNOSIS — Z5309 Procedure and treatment not carried out because of other contraindication: Secondary | ICD-10-CM

## 2020-08-14 DIAGNOSIS — Z880 Allergy status to penicillin: Secondary | ICD-10-CM | POA: Diagnosis not present

## 2020-08-14 DIAGNOSIS — I10 Essential (primary) hypertension: Secondary | ICD-10-CM | POA: Diagnosis present

## 2020-08-14 DIAGNOSIS — E876 Hypokalemia: Secondary | ICD-10-CM | POA: Diagnosis not present

## 2020-08-14 DIAGNOSIS — I69354 Hemiplegia and hemiparesis following cerebral infarction affecting left non-dominant side: Secondary | ICD-10-CM | POA: Diagnosis not present

## 2020-08-14 DIAGNOSIS — M1990 Unspecified osteoarthritis, unspecified site: Secondary | ICD-10-CM | POA: Diagnosis present

## 2020-08-14 DIAGNOSIS — Z9841 Cataract extraction status, right eye: Secondary | ICD-10-CM

## 2020-08-14 DIAGNOSIS — Z86718 Personal history of other venous thrombosis and embolism: Secondary | ICD-10-CM | POA: Diagnosis not present

## 2020-08-14 DIAGNOSIS — Z7982 Long term (current) use of aspirin: Secondary | ICD-10-CM

## 2020-08-14 LAB — CBC
HCT: 41.9 % (ref 39.0–52.0)
Hemoglobin: 13.6 g/dL (ref 13.0–17.0)
MCH: 32.4 pg (ref 26.0–34.0)
MCHC: 32.5 g/dL (ref 30.0–36.0)
MCV: 99.8 fL (ref 80.0–100.0)
Platelets: 238 10*3/uL (ref 150–400)
RBC: 4.2 MIL/uL — ABNORMAL LOW (ref 4.22–5.81)
RDW: 13.3 % (ref 11.5–15.5)
WBC: 8.8 10*3/uL (ref 4.0–10.5)
nRBC: 0 % (ref 0.0–0.2)

## 2020-08-14 LAB — DIFFERENTIAL
Abs Immature Granulocytes: 0.02 10*3/uL (ref 0.00–0.07)
Basophils Absolute: 0.1 10*3/uL (ref 0.0–0.1)
Basophils Relative: 1 %
Eosinophils Absolute: 0.8 10*3/uL — ABNORMAL HIGH (ref 0.0–0.5)
Eosinophils Relative: 9 %
Immature Granulocytes: 0 %
Lymphocytes Relative: 34 %
Lymphs Abs: 3 10*3/uL (ref 0.7–4.0)
Monocytes Absolute: 0.8 10*3/uL (ref 0.1–1.0)
Monocytes Relative: 9 %
Neutro Abs: 4.2 10*3/uL (ref 1.7–7.7)
Neutrophils Relative %: 47 %

## 2020-08-14 LAB — COMPREHENSIVE METABOLIC PANEL
ALT: 17 U/L (ref 0–44)
AST: 20 U/L (ref 15–41)
Albumin: 3.7 g/dL (ref 3.5–5.0)
Alkaline Phosphatase: 49 U/L (ref 38–126)
Anion gap: 7 (ref 5–15)
BUN: 16 mg/dL (ref 8–23)
CO2: 25 mmol/L (ref 22–32)
Calcium: 9 mg/dL (ref 8.9–10.3)
Chloride: 109 mmol/L (ref 98–111)
Creatinine, Ser: 0.9 mg/dL (ref 0.61–1.24)
GFR, Estimated: >60 mL/min (ref >60)
Glucose, Bld: 116 mg/dL — ABNORMAL HIGH (ref 70–99)
Potassium: 3.4 mmol/L — ABNORMAL LOW (ref 3.5–5.1)
Sodium: 141 mmol/L (ref 135–145)
Total Bilirubin: 0.8 mg/dL (ref 0.3–1.2)
Total Protein: 6.6 g/dL (ref 6.5–8.1)

## 2020-08-14 LAB — RESP PANEL BY RT-PCR (FLU A&B, COVID) ARPGX2
Influenza A by PCR: NEGATIVE
Influenza B by PCR: NEGATIVE
SARS Coronavirus 2 by RT PCR: NEGATIVE

## 2020-08-14 LAB — PROTIME-INR
INR: 1 (ref 0.8–1.2)
Prothrombin Time: 12.9 seconds (ref 11.4–15.2)

## 2020-08-14 LAB — CBG MONITORING, ED: Glucose-Capillary: 113 mg/dL — ABNORMAL HIGH (ref 70–99)

## 2020-08-14 LAB — APTT: aPTT: 27 seconds (ref 24–36)

## 2020-08-14 LAB — ETHANOL: Alcohol, Ethyl (B): <10 mg/dL (ref <10)

## 2020-08-14 MED ORDER — SODIUM CHLORIDE 0.9 % IV SOLN
50.0000 mL | Freq: Once | INTRAVENOUS | Status: AC
  Administered 2020-08-14: 50 mL via INTRAVENOUS

## 2020-08-14 MED ORDER — SODIUM CHLORIDE 0.9 % IV BOLUS
500.0000 mL | Freq: Once | INTRAVENOUS | Status: AC
  Administered 2020-08-14: 500 mL via INTRAVENOUS

## 2020-08-14 MED ORDER — IOHEXOL 350 MG/ML SOLN
75.0000 mL | Freq: Once | INTRAVENOUS | Status: AC | PRN
  Administered 2020-08-14: 75 mL via INTRAVENOUS

## 2020-08-14 MED ORDER — LABETALOL HCL 5 MG/ML IV SOLN
5.0000 mg | Freq: Once | INTRAVENOUS | Status: AC
  Administered 2020-08-14: 5 mg via INTRAVENOUS
  Filled 2020-08-14: qty 4

## 2020-08-14 MED ORDER — SODIUM CHLORIDE 0.9 % IV SOLN
100.0000 mL/h | INTRAVENOUS | Status: DC
  Administered 2020-08-14 – 2020-08-16 (×4): 100 mL/h via INTRAVENOUS

## 2020-08-14 MED ORDER — ALTEPLASE (STROKE) FULL DOSE INFUSION
90.0000 mg | Freq: Once | INTRAVENOUS | Status: AC
  Administered 2020-08-14: 90 mg via INTRAVENOUS
  Filled 2020-08-14: qty 100

## 2020-08-14 NOTE — ED Notes (Signed)
Pt is post TPA administration. Pt is A&O x4 and is talkative. Pt denies any drastic changes at this time. Pt given warm blanket and encouraged to hit call light if there are any changes. Will continue to monitor pt.

## 2020-08-14 NOTE — Consult Note (Signed)
[facility] Tele[facility]urology Consult Services   Date of Service:   08/14/2020 19:53:47  Diagnosis:     .  I63.9 - Cerebrovascular accident (CVA), unspecified mechanism (HCC)  Impression:     . 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 The patient only has an NIHSS of 2 however a left VF deficit would be very disabling as he in the only member of his family who can drive. We discussed the risks and benefits of IV TPA. He understands tha there is a 5-6% risk of bleeding including ICH. He accepts this risk. IV TPA bolus without complication. Will need repeat CT head in 24 hours, would add a CTA head/neck at that time as he cannot have MRI. ICU admission. Hold all antiplatelets for 24 hours.  Metrics: Last Known Well: 08/14/2020 17:00:00 [facility] Notification Time: 08/14/2020 19:53:47 Arrival Time: 08/14/2020 19:34:00 Stamp Time: 08/14/2020 19:53:47 Initial Response Time: 08/14/2020 19:59:24 Symptoms: visual loss . NIHSS Start Assessment Time: 08/14/2020 20:01:33 Patient is a candidate for Thrombolytic. Thrombolytic Medical Decision: 08/14/2020 20:12:51 Needle Time: 08/14/2020 20:22:47 Weight Noted by Staff: 117.3 kg  CT head showed no acute hemorrhage or acute core infarct.  ED Physician notified of diagnostic impression and management plan on 08/14/2020 20:25:25  Advanced Imaging: Advanced Imaging Not Recommended because:  Clinical presentation is not suggestion of LVO or Low clinical suspicion of LVO based on presentation   Thrombolytic Contraindications:  Last Known Well > 4.5 hours: No CT Head showing hemorrhage: No Ischemic stroke within 3 months: No Severe head trauma within 3 months: No Intracranial/intraspinal surgery within 3 months: No History of intracranial hemorrhage: No Symptoms and signs consistent with an SAH: No GI malignancy or GI bleed within 21 days: No Coagulopathy: Platelets <100 000 /mm3, INR >1.7, aPTT>40 s, or PT >15  s: No Treatment dose of LMWH within the previous 24 hrs: No Use of NOACs in past 48 hours: No Glycoprotein IIb/IIIa receptor inhibitors use: No Symptoms consistent with infective endocarditis: No Suspected aortic arch dissection: No Intra-axial intracranial neoplasm: No  Thrombolytic Decision and Management Plan: Management with thrombolytic treatment was explained to the Patient as was risks and benefits and alternatives to the treatment. Patient agrees with the decision to proceed with thrombolytic treatment. . All questions were answered and the Patient expressed understanding of the treatment plan.  Our recommendations are outlined below.  Recommendations: IV Alteplase recommended.  Thrombolytic bolus given Without Complication.   IV Alteplase/Activase Total Dose - 90.0 mg IV Alteplase/Activase Bolus Dose - 9.0 mg IV Alteplase/Activase Infusion Dose - 81.0 mg   Routine post Thrombolytic monitoring including neuro checks and blood pressure control during/after treatment Monitor blood pressure Check blood pressure and neuro assessment every 15 min for 2 h, then every 30 min for 6 h, and finally every hour for 16 h.  Manage Blood Pressure per post Thrombolytic protocol.      .  Admission to ICU     .  CT brain 24 hours post Thrombolytic     .  NPO until swallowing screen performed and passed     .  No antiplatelet agents or anticoagulants (including heparin for DVT prophylaxis) in first 24 hours     .  No Foley catheter, nasogastric tube, arterial catheter or central venous catheter for 24 hr, unless absolutely necessary     .  Telemetry     .  Bedside swallow evaluation     .  HOB less than 30 degrees     .  Euglycemia     .  Avoid hyperthermia, PRN acetaminophen     .  DVT prophylaxis     .  Inpatient Neurology Consultation     .  Stroke evaluation as per inpatient neurology recommendations  Discussed with ED  physician    ------------------------------------------------------------------------------  History of Present Illness: Patient is a 80 year old Male.  Patient was brought by private transportation with symptoms of visual loss .  This is a 80 year old man with history of hypertension. The patient tells me that at 17:00 today he developed headache and visual blurred vision. He felt like he could not see out of the left eye. there was some mild left weakness as well. He presents to the hospital and continues to have a left visual field deficit. In the past he did take eliquis, but has not taken this medication in 2-3 years.  Last seen normal was within 4.5 hours. There is no history of hemorrhagic complications or intracranial hemorrhage. There is no history of Recent Anticoagulants. There is no history of recent major surgery. There is no history of recent stroke.  Past Medical History:     . Hypertension     . There is NO history of Diabetes Mellitus     . There is NO history of Atrial Fibrillation     . There is NO history of Stroke  Anticoagulant use:  No  Antiplatelet use: Yes asa  Allergies:  Reviewed    Examination: BP(154/78), Pulse(71), Blood Glucose(113) 1A: Level of Consciousness - Alert; keenly responsive + 0 1B: Ask Month and Age - Both Questions Right + 0 1C: Blink Eyes & Squeeze Hands - Performs Both Tasks + 0 2: Test Horizontal Extraocular Movements - Normal + 0 3: Test Visual Fields - Complete Hemianopia + 2 4: Test Facial Palsy (Use Grimace if Obtunded) - Normal symmetry + 0 5A: Test Left Arm Motor Drift - No Drift for 10 Seconds + 0 5B: Test Right Arm Motor Drift - No Drift for 10 Seconds + 0 6A: Test Left Leg Motor Drift - No Drift for 5 Seconds + 0 6B: Test Right Leg Motor Drift - No Drift for 5 Seconds + 0 7: Test Limb Ataxia (FNF/Heel-Shin) - No Ataxia + 0 8: Test Sensation - Normal; No sensory loss + 0 9: Test Language/Aphasia - Normal; No aphasia  + 0 10: Test Dysarthria - Normal + 0 11: Test Extinction/Inattention - No abnormality + 0  NIHSS Score: 2  Pre-Morbid Modified Rankin Scale: 0 Points = No symptoms at all   Patient/Family was informed the Neurology Consult would occur via TeleHealth consult by way of interactive audio and video telecommunications and consented to receiving care in this manner.   Patient is being evaluated for possible acute neurologic impairment and high probability of imminent or life-threatening deterioration. I spent total of 40 minutes providing care to this patient, including time for face to face visit via telemedicine, review of medical records, imaging studies and discussion of findings with providers, the patient and/or family.   Dr Raynelle Dick   [facility] [phone]  Case [id number]

## 2020-08-14 NOTE — ED Notes (Signed)
10cc of aleplase discarded prior to administratio per neurologist. Witnessed by Tinnie Gens, RN and Claris Gower, RN

## 2020-08-14 NOTE — ED Notes (Signed)
Patient transported to CT 

## 2020-08-14 NOTE — ED Triage Notes (Signed)
t arrived via [facility] reporting LKW @ 1700 today. Pt arrived with vision loss on left side, LUE and LLE numbness. Pt has Hx of previous Stroke reported and reports only taking baby ASA daily. EDP Notified and at bedside. Pt transported straight to CT.

## 2020-08-14 NOTE — ED Notes (Signed)
Report given to [facility] 

## 2020-08-14 NOTE — ED Triage Notes (Signed)
Pt presents [facility] for possible code stroke. Pt LKW was 5 this evening. Pt states his wife was cooking and he all of a sudden had blurry vision, headache and weakness on the left side. Upon assessment pt has decreased sensation to the left side and left sided weakness. Pt alert and oriented at this time.
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
## 2020-08-15 ENCOUNTER — Inpatient Hospital Stay (HOSPITAL_COMMUNITY): Payer: Medicare Other

## 2020-08-15 DIAGNOSIS — I639 Cerebral infarction, unspecified: Secondary | ICD-10-CM | POA: Diagnosis present

## 2020-08-15 LAB — ECHOCARDIOGRAM COMPLETE
Area-P 1/2: 2.8 cm2
Height: 75 in
S' Lateral: 2.7 cm
Weight: 4137.59 oz

## 2020-08-15 LAB — TROPONIN I (HIGH SENSITIVITY)
Troponin I (High Sensitivity): 8 ng/L (ref <18)
Troponin I (High Sensitivity): 7 ng/L (ref <18)

## 2020-08-15 LAB — MRSA PCR SCREENING: MRSA by PCR: NEGATIVE

## 2020-08-15 MED ORDER — PANTOPRAZOLE SODIUM 40 MG IV SOLR
40.0000 mg | Freq: Every day | INTRAVENOUS | Status: DC
  Administered 2020-08-15 – 2020-08-16 (×2): 40 mg via INTRAVENOUS
  Filled 2020-08-15 (×2): qty 40

## 2020-08-15 MED ORDER — CHLORHEXIDINE GLUCONATE CLOTH 2 % EX PADS
6.0000 | MEDICATED_PAD | Freq: Every day | CUTANEOUS | Status: DC
  Administered 2020-08-15 – 2020-08-19 (×5): 6 via TOPICAL

## 2020-08-15 MED ORDER — TAMSULOSIN HCL 0.4 MG PO CAPS
0.4000 mg | ORAL_CAPSULE | Freq: Every day | ORAL | Status: DC
  Administered 2020-08-15 – 2020-08-18 (×4): 0.4 mg via ORAL
  Filled 2020-08-15 (×4): qty 1

## 2020-08-15 MED ORDER — PERFLUTREN LIPID MICROSPHERE
1.0000 mL | INTRAVENOUS | Status: AC | PRN
  Administered 2020-08-15: 2 mL via INTRAVENOUS
  Filled 2020-08-15: qty 10

## 2020-08-15 MED ORDER — CLEVIDIPINE BUTYRATE 0.5 MG/ML IV EMUL: 0.0000 mg/h | INTRAVENOUS | Status: DC

## 2020-08-15 MED ORDER — ACETAMINOPHEN 650 MG RE SUPP: 650.0000 mg | RECTAL | Status: DC | PRN

## 2020-08-15 MED ORDER — ATORVASTATIN CALCIUM 10 MG PO TABS
20.0000 mg | ORAL_TABLET | Freq: Every day | ORAL | Status: DC
  Administered 2020-08-15 – 2020-08-18 (×4): 20 mg via ORAL
  Filled 2020-08-15 (×4): qty 2

## 2020-08-15 MED ORDER — ACETAMINOPHEN 325 MG PO TABS: 650.0000 mg | ORAL_TABLET | ORAL | Status: DC | PRN

## 2020-08-15 MED ORDER — STROKE: EARLY STAGES OF RECOVERY BOOK
Freq: Once | Status: DC
  Filled 2020-08-15: qty 1

## 2020-08-15 MED ORDER — PROPRANOLOL HCL 10 MG PO TABS
10.0000 mg | ORAL_TABLET | Freq: Three times a day (TID) | ORAL | Status: DC
  Administered 2020-08-15 – 2020-08-19 (×14): 10 mg via ORAL
  Filled 2020-08-15 (×16): qty 1

## 2020-08-15 MED ORDER — SODIUM CHLORIDE 0.9 % IV SOLN: INTRAVENOUS | Status: DC

## 2020-08-15 MED ORDER — ASCORBIC ACID 500 MG PO TABS
500.0000 mg | ORAL_TABLET | Freq: Two times a day (BID) | ORAL | Status: DC
  Administered 2020-08-15 – 2020-08-19 (×9): 500 mg via ORAL
  Filled 2020-08-15 (×9): qty 1

## 2020-08-15 MED ORDER — ACETAMINOPHEN 160 MG/5ML PO SOLN: 650.0000 mg | ORAL | Status: DC | PRN

## 2020-08-15 MED ORDER — SENNOSIDES-DOCUSATE SODIUM 8.6-50 MG PO TABS
1.0000 | ORAL_TABLET | Freq: Every evening | ORAL | Status: DC | PRN
  Administered 2020-08-18: 1 via ORAL
  Filled 2020-08-15: qty 1

## 2020-08-15 NOTE — Progress Notes (Signed)
PT Cancellation Note  Patient Details Name: [name] MRN: [id number] DOB: 1941/03/25   Cancelled Treatment:    Reason Eval/Treat Not Completed: Active bedrest order   Lillia Pauls, PT, DPT Acute Rehabilitation Services Pager [phone] Office [phone]    Norval Morton 08/15/2020, 7:17 AM
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
## 2020-08-15 NOTE — Progress Notes (Signed)
  Echocardiogram 2D Echocardiogram with definity has been performed.  Leta Jungling M 08/15/2020, 10:16 AM

## 2020-08-15 NOTE — Progress Notes (Signed)
Neurology Progress Note   S:// Patient is alert and oriented sitting up in bed.  IN NAD. He voices no new concerns. No family at bedside   O:// Current vital signs: BP 129/78   Pulse 62   Temp 98.3 F (36.8 C) (Oral)   Resp (!) 6   Ht 6\' 3"  (1.905 m)   Wt 117.3 kg   SpO2 95%   BMI 32.32 kg/m  Vital signs in last 24 hours: Temp:  [97.6 F (36.4 C)-98.4 F (36.9 C)] 98.3 F (36.8 C) (04/24 1200) Pulse Rate:  [62-81] 62 (04/24 1200) Resp:  [0-22] 6 (04/24 1200) BP: (108-174)/(67-109) 129/78 (04/24 1200) SpO2:  [91 %-97 %] 95 % (04/24 1200) Weight:  [117.3 kg] 117.3 kg (04/23 1954)   GENERAL: Awake, alert in NAD HEENT: - Normocephalic and atraumatic, dry mm LUNGS - Clear to auscultation bilaterally with no wheezes CV - S1S2 RRR, no m/r/g, equal pulses bilaterally. ABDOMEN - Soft, nontender, nondistended with normoactive BS Ext: warm, well perfused, intact peripheral pulses, mild 1+ edema bilateral feet  Psych: normal affect   NEURO:  Mental Status: AA&Ox3  Language: speech is clear.  Naming, repetition, fluency, and comprehension intact. Cranial Nerves: PERRL 29mm/brisk. EOMI, left field cut, no facial asymmetry, facial sensation intact, hearing intact, tongue/uvula/soft palate midline, normal sternocleidomastoid and trapezius muscle strength. No evidence of tongue atrophy or fibrillations Motor: 4/5 in all extremities Tone: is normal and bulk is normal Sensation- decreased sensation on the right  Coordination: FTN intact bilaterally, no ataxia in BLE. Gait- deferred    Medications  Current Facility-Administered Medications:  .   stroke: mapping our early stages of recovery book, , Does not apply, Once, 1m, MD .  Caryl Pina sodium chloride 0.9 % bolus 500 mL, 500 mL, Intravenous, Once, Stopped at 08/14/20 2102 **FOLLOWED BY** 0.9 %  sodium chloride infusion, 100 mL/hr, Intravenous, Continuous, 2103, MD, Last Rate: 100 mL/hr at 08/15/20 0033, 100  mL/hr at 08/15/20 0033 .  0.9 %  sodium chloride infusion, , Intravenous, Continuous, 08/17/20, MD .  acetaminophen (TYLENOL) tablet 650 mg, 650 mg, Oral, Q4H PRN **OR** acetaminophen (TYLENOL) 160 MG/5ML solution 650 mg, 650 mg, Per Tube, Q4H PRN **OR** acetaminophen (TYLENOL) suppository 650 mg, 650 mg, Rectal, Q4H PRN, Caryl Pina, MD .  ascorbic acid (VITAMIN C) tablet 500 mg, 500 mg, Oral, BID, Caryl Pina, MD, 500 mg at 08/15/20 1007 .  Chlorhexidine Gluconate Cloth 2 % PADS 6 each, 6 each, Topical, Daily, 08/17/20, MD .  clevidipine (CLEVIPREX) infusion 0.5 mg/mL, 0-21 mg/hr, Intravenous, Continuous, Caryl Pina, MD .  pantoprazole (PROTONIX) injection 40 mg, 40 mg, Intravenous, QHS, Caryl Pina, MD .  propranolol (INDERAL) tablet 10 mg, 10 mg, Oral, TID, Caryl Pina, MD, 10 mg at 08/15/20 1007 .  senna-docusate (Senokot-S) tablet 1 tablet, 1 tablet, Oral, QHS PRN, 08/17/20, MD .  tamsulosin (FLOMAX) capsule 0.4 mg, 0.4 mg, Oral, QPC supper, Caryl Pina, MD Labs CBC    Component Value Date/Time   WBC 8.8 08/14/2020 1941   RBC 4.20 (L) 08/14/2020 1941   HGB 13.6 08/14/2020 1941   HCT 41.9 08/14/2020 1941   PLT 238 08/14/2020 1941   MCV 99.8 08/14/2020 1941   MCH 32.4 08/14/2020 1941   MCHC 32.5 08/14/2020 1941   RDW 13.3 08/14/2020 1941   LYMPHSABS 3.0 08/14/2020 1941   MONOABS 0.8 08/14/2020 1941   EOSABS 0.8 (H) 08/14/2020 1941   BASOSABS 0.1 08/14/2020 1941  CMP     Component Value Date/Time   NA 141 08/14/2020 1941   K 3.4 (L) 08/14/2020 1941   CL 109 08/14/2020 1941   CO2 25 08/14/2020 1941   GLUCOSE 116 (H) 08/14/2020 1941   BUN 16 08/14/2020 1941   CREATININE 0.90 08/14/2020 1941   CALCIUM 9.0 08/14/2020 1941   PROT 6.6 08/14/2020 1941   ALBUMIN 3.7 08/14/2020 1941   AST 20 08/14/2020 1941   ALT 17 08/14/2020 1941   ALKPHOS 49 08/14/2020 1941   BILITOT 0.8 08/14/2020 1941   GFRNONAA >60 08/14/2020 1941   GFRAA >60 10/17/2018  0543    glycosylated hemoglobin  Lipid Panel     Component Value Date/Time   CHOL 144 03/16/2018 0348   TRIG 102 03/16/2018 0348   HDL 26 (L) 03/16/2018 0348   CHOLHDL 5.5 03/16/2018 0348   VLDL 20 03/16/2018 0348   LDLCALC 98 03/16/2018 0348     Imaging I have reviewed images in epic and the results pertinent to this consultation are:  CT-scan of the brain 1. No acute intracranial abnormality. 2. ASPECTS is 10. 3. Advanced generalized atrophy and chronic ischemic Microangiopathy.  CTA h/n  1. No emergent large vessel occlusion or high-grade stenosis of the intracranial or cervical arteries. 2. Previously biopsied 1.8 cm nodule in the right lobe of the thyroid gland. This has been evaluated on previous imaging  MRI examination of the brain unable to shrapnel in body   Assessment:  [name] is an 80 y.o. male with a PMHx of stroke, HTN, arrhythmia and tremor who presented to the [facility] ED on Saturday evening with acute left-sided vision loss in conjunction with LUE and LLE numbness and weakness. Symptom onset was at 1700 while he and his wife were cooking. He had sudden onset of blurry vision, headache and left sided weakness. 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 PT/OT BP goal < 180/105 Lipid, HGBA1c ordered Echo ordered Continue home statin  Maintain normoglycemia  Gevena Mart, DNP, ACNPC-AG
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
## 2020-08-15 NOTE — Progress Notes (Signed)
1936 call time 1957 beeper time 1941 exam started 1943 exam finished 1943 images sent to soc 1947 exam completed in epic 1947 [facility] rad called

## 2020-08-15 NOTE — ED Notes (Signed)
Attempted to call pt wife to update her on her husband but no answer.

## 2020-08-16 DIAGNOSIS — I6300 Cerebral infarction due to thrombosis of unspecified precerebral artery: Secondary | ICD-10-CM

## 2020-08-16 DIAGNOSIS — I639 Cerebral infarction, unspecified: Secondary | ICD-10-CM

## 2020-08-16 LAB — LIPID PANEL
Cholesterol: 83 mg/dL (ref 0–200)
HDL: 28 mg/dL — ABNORMAL LOW (ref >40)
LDL Cholesterol: 41 mg/dL (ref 0–99)
Total CHOL/HDL Ratio: 3 RATIO
Triglycerides: 68 mg/dL (ref <150)
VLDL: 14 mg/dL (ref 0–40)

## 2020-08-16 LAB — HEMOGLOBIN A1C
Hgb A1c MFr Bld: 5.8 % — ABNORMAL HIGH (ref 4.8–5.6)
Mean Plasma Glucose: 119.76 mg/dL

## 2020-08-16 MED ORDER — LIDOCAINE 4 % EX CREA
TOPICAL_CREAM | Freq: Three times a day (TID) | CUTANEOUS | Status: DC | PRN
  Filled 2020-08-16: qty 5

## 2020-08-16 MED ORDER — DICLOFENAC SODIUM 1 % EX GEL
2.0000 g | Freq: Four times a day (QID) | CUTANEOUS | Status: DC
  Administered 2020-08-16 – 2020-08-19 (×13): 2 g via TOPICAL
  Filled 2020-08-16: qty 100

## 2020-08-16 MED ORDER — ENOXAPARIN SODIUM 40 MG/0.4ML ~~LOC~~ SOLN
40.0000 mg | SUBCUTANEOUS | Status: DC
Start: 2020-08-16 — End: 2020-08-17
  Administered 2020-08-16: 40 mg via SUBCUTANEOUS
  Filled 2020-08-16: qty 0.4

## 2020-08-16 MED ORDER — CLOPIDOGREL BISULFATE 75 MG PO TABS
75.0000 mg | ORAL_TABLET | Freq: Every day | ORAL | Status: DC
  Administered 2020-08-16: 75 mg via ORAL
  Filled 2020-08-16 (×2): qty 1

## 2020-08-16 MED ORDER — ASPIRIN EC 81 MG PO TBEC
81.0000 mg | DELAYED_RELEASE_TABLET | Freq: Every day | ORAL | Status: DC
  Administered 2020-08-16 – 2020-08-17 (×2): 81 mg via ORAL
  Filled 2020-08-16 (×2): qty 1

## 2020-08-16 MED ORDER — ACETAMINOPHEN 325 MG PO TABS
650.0000 mg | ORAL_TABLET | Freq: Four times a day (QID) | ORAL | Status: DC | PRN
  Administered 2020-08-16 – 2020-08-18 (×3): 650 mg via ORAL
  Filled 2020-08-16 (×3): qty 2
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
## 2020-08-16 NOTE — Progress Notes (Signed)
Inpatient Rehab Admissions Coordinator Note:   Per PT recommendations, pt was screened for CIR candidacy by Estill Dooms, PT, DPT.  At this time we are recommending a CIR consult and I will place an order per our protocol.  Please contact me with questions.   Estill Dooms, PT, DPT [phone] 08/16/20 1:15 PM

## 2020-08-16 NOTE — Evaluation (Signed)
Physical Therapy Evaluation Patient Details Name: [name] MRN: [id number] DOB: Dec 24, 1940 Today's Date: 08/16/2020   History of Present Illness  Patient is a 80 y.o. male who presented to [facility] with acute left sided vision loss in conjunction with LUE and LLE numbness and weakness. 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 Unable to receive MRI due to shrapnel in body. PMH: CVA, HTN, PE, arrhythmia and tremor.  Clinical Impression  Prior to admission, pt lives with his wife and is independent with mobility using a RW. Of note, pt wife has a memory deficit and he assists with her IADL's. Pt presents with decreased functional mobility secondary to left visual field deficit (noted in superior and temporal fields), gait abnormalities, LUE weakness, and decreased activity tolerance. Pt requiring min assist (+2 safety) for taking pivotal steps from bed to chair using walker. SpO2 95-97% on RA, BP 113/68. Recommend CIR to address deficits and maximize functional independence.     Follow Up Recommendations CIR    Equipment Recommendations  None recommended by PT    Recommendations for Other Services       Precautions / Restrictions Precautions Precautions: Fall;Other (comment) Precaution Comments: L visual field deficit Restrictions Weight Bearing Restrictions: No      Mobility  Bed Mobility Overal bed mobility: Needs Assistance Bed Mobility: Supine to Sit     Supine to sit: Supervision     General bed mobility comments: Supervision for safety    Transfers Overall transfer level: Needs assistance Equipment used: Rolling walker (2 wheeled) Transfers: Sit to/from Stand Sit to Stand: Min assist;From elevated surface;+2 safety/equipment         General transfer comment: MinA to rise to stand x 2, increased time  Ambulation/Gait Ambulation/Gait assistance: Min  assist;+2 safety/equipment Gait Distance (Feet): 3 Feet Assistive device: Rolling walker (2 wheeled) Gait Pattern/deviations: Step-through pattern;Decreased stride length;Trunk flexed Gait velocity: decreased Gait velocity interpretation: <1.31 ft/sec, indicative of household ambulator General Gait Details: Pivotal steps from bed to chair, minA for balance, +2 safety. Cues for midline positioning, upright posture. Pt with decreased weight shift away from left side  Stairs            Wheelchair Mobility    Modified Rankin (Stroke Patients Only) Modified Rankin (Stroke Patients Only) Pre-Morbid Rankin Score: No significant disability Modified Rankin: Moderately severe disability     Balance Overall balance assessment: Needs assistance Sitting-balance support: Feet supported Sitting balance-Leahy Scale: Fair     Standing balance support: Bilateral upper extremity supported Standing balance-Leahy Scale: Poor Standing balance comment: reliant on external support                             Pertinent Vitals/Pain Pain Assessment: Faces Faces Pain Scale: Hurts a little bit Pain Location: R knee (chronic) Pain Descriptors / Indicators: Discomfort Pain Intervention(s): Monitored during session    Home Living Family/patient expects to be discharged to:: Private residence Living Arrangements: Spouse/significant other Available Help at Discharge: Family;Available PRN/intermittently Type of Home: House Home Access: Ramped entrance     Home Layout: Two level;Other (Comment) (chair lift to basement) Home Equipment: Walker - 2 wheels;Walker - 4 wheels;Cane - single point;Cane - quad;Wheelchair - manual;Shower seat;Grab bars - toilet;Grab bars - tub/shower Additional Comments: Pt wife has memory difficulties    Prior Function Level of Independence: Independent with assistive device(s)  Comments: Drives, does grocery shopping. Attending OPPT at [facility]      Hand Dominance        Extremity/Trunk Assessment   Upper Extremity Assessment Upper Extremity Assessment: Defer to OT evaluation    Lower Extremity Assessment Lower Extremity Assessment: RLE deficits/detail;LLE deficits/detail RLE Deficits / Details: Strength 5/5 LLE Deficits / Details: Strength 5/5       Communication   Communication: No difficulties  Cognition Arousal/Alertness: Awake/alert Behavior During Therapy: Flat affect Overall Cognitive Status: Impaired/Different from baseline Area of Impairment: Safety/judgement;Awareness                         Safety/Judgement: Decreased awareness of safety;Decreased awareness of deficits Awareness: Emergent   General Comments: Decreased awareness of safety/deficits. Pt with difficulty describing acute symptoms vs PLOF. Following multi-step commands      General Comments      Exercises     Assessment/Plan    PT Assessment Patient needs continued PT services  PT Problem List Decreased strength;Decreased activity tolerance;Decreased balance;Decreased mobility;Decreased cognition;Decreased safety awareness       PT Treatment Interventions DME instruction;Gait training;Functional mobility training;Therapeutic activities;Therapeutic exercise;Balance training;Patient/family education    PT Goals (Current goals can be found in the Care Plan section)  Acute Rehab PT Goals Patient Stated Goal: be able to care for his wife PT Goal Formulation: With patient Time For Goal Achievement: 08/30/20 Potential to Achieve Goals: Good    Frequency Min 4X/week   Barriers to discharge        Co-evaluation               AM-PAC PT "6 Clicks" Mobility  Outcome Measure Help needed turning from your back to your side while in a flat bed without using bedrails?: None Help needed moving from lying on your back to sitting on the side of a flat bed without using bedrails?: A Little Help needed moving to and from a  bed to a chair (including a wheelchair)?: A Little Help needed standing up from a chair using your arms (e.g., wheelchair or bedside chair)?: A Little Help needed to walk in hospital room?: A Lot Help needed climbing 3-5 steps with a railing? : A Lot 6 Click Score: 17    End of Session Equipment Utilized During Treatment: Gait belt Activity Tolerance: Patient tolerated treatment well Patient left: in chair;with call bell/phone within reach;with chair alarm set Nurse Communication: Mobility status PT Visit Diagnosis: Unsteadiness on feet (R26.81);Difficulty in walking, not elsewhere classified (R26.2)    Time: 2956-2130 PT Time Calculation (min) (ACUTE ONLY): 27 min   Charges:   PT Evaluation $PT Eval Moderate Complexity: 1 Mod PT Treatments $Therapeutic Activity: 8-22 mins        Lillia Pauls, PT, DPT Acute Rehabilitation Services Pager [phone] Office [phone]   Norval Morton 08/16/2020, 12:21 PM

## 2020-08-16 NOTE — Progress Notes (Signed)
Report given to 3W, pt transferred to 3W16 with all belongings. Wife Liborio Nixon updated with new room.
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
## 2020-08-16 NOTE — Progress Notes (Signed)
Occupational Therapy Evaluation  PTA pt  lives with his wife who he assists with IADL tasks due to her "memory problems". Pt drives and is currently involved in outpt PT. Pt presents with L sided weakness and L visual field deficits in addition to being tangential at times. Recommend CIR to maximize functional level of independence with goal of returning home. Will follow acutely.    08/16/20 1300  OT Visit Information  Last OT Received On 08/16/20  Assistance Needed +2 (for chair follow)  PT/OT/SLP Co-Evaluation/Treatment Yes (partail session)  Reason for Co-Treatment For patient/therapist safety  OT goals addressed during session ADL's and self-care  History of Present Illness Patient is a 80 y.o. male who presented to [facility] ED with acute left sided vision loss in conjunction with LUE and LLE numbness and weakness. 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 Unable to receive MRI due to shrapnel in body. PMH: CVA, HTN, PE, arrhythmia and tremor.  Precautions  Precautions Fall;Other (comment)  Precaution Comments L visual field deficit  Restrictions  Weight Bearing Restrictions No  Home Living  Family/patient expects to be discharged to: Private residence  Living Arrangements Spouse/significant other  Available Help at Discharge Family;Available PRN/intermittently  Type of Home House  Home Access Ramped entrance  Home Layout Two level;Other (Comment) (chair lift to basement)  Scientist, physiological Yes  How Accessible Accessible via walker  Home Equipment Walker - 2 wheels;Walker - 4 wheels;Cane - single point;Cane - quad;Wheelchair - manual;Shower seat;Grab bars - toilet;Grab bars - tub/shower  Additional Comments Pt wife has memory difficulties  Prior Function  Level of Independence Independent with assistive  device(s)  Comments Drives, does grocery shopping. Attending OPPT at [facility] - states he is going to start "pool therapy"  Communication  Communication No difficulties  Pain Assessment  Pain Assessment Faces  Faces Pain Scale 2  Pain Location R knee (chronic)  Pain Descriptors / Indicators Discomfort  Pain Intervention(s) Limited activity within patient's tolerance  Cognition  Arousal/Alertness Awake/alert  Behavior During Therapy Flat affect  Overall Cognitive Status Impaired/Different from baseline  Area of Impairment Safety/judgement;Awareness;Attention;Problem solving  Current Attention Level Selective  Safety/Judgement Decreased awareness of safety;Decreased awareness of deficits  Awareness Emergent  Problem Solving Slow processing  General Comments Decreased awareness of safety/deficits. Pt with difficulty describing acute symptoms vs PLOF. Following multi-step commands  Upper Extremity Assessment  Upper Extremity Assessment LUE deficits/detail  LUE Deficits / Details limited shoulder AROM to @ 90 but using funcitonally; note slower finger to nose; intention tremor present  Lower Extremity Assessment  RLE Deficits / Details Strength 5/5  LLE Deficits / Details Strength 5/5  Cervical / Trunk Assessment  Cervical / Trunk Assessment Kyphotic  ADL  Overall ADL's  Needs assistance/impaired  Eating/Feeding Modified independent  Grooming Set up;Supervision/safety;Sitting  Upper Body Bathing Set up;Supervision/ safety;Sitting  Lower Body Bathing Moderate assistance;Sit to/from stand  Upper Body Dressing  Set up;Supervision/safety;Sitting  Lower Body Dressing Moderate assistance;Sit to/from Scientist, research (life sciences) Moderate assistance;RW;Stand-pivot;+2 for safety/equipment  Toileting- Clothing Manipulation and Hygiene Moderate assistance  Toileting - Clothing Manipulation Details (indicate cue type and reason) condom cath  Functional mobility during ADLs Moderate assistance;+2 for  safety/equipment  Vision- History  Baseline Vision/History Wears glasses  Wears Glasses Reading only  Vision- Assessment  Vision Assessment? Yes  Eye Alignment [facility]  Ocular Range of Motion  [facility]  Alignment/Gaze Preference WDL  Tracking/Visual Pursuits Decreased smoothness of horizontal tracking;Decreased smoothness of vertical tracking  Saccades Additional eye shifts occurred during testing;Additional head turns occurred during testing  Visual Fields Left visual field deficit (greater cut superiorly; does not see target until almost midline)  Additional Comments Missing words on L; will further assess  Perception  Comments Appeasr to have difficulty finding midline; R bias  Bed Mobility  Overal bed mobility Needs Assistance  Bed Mobility Supine to Sit  Supine to sit Supervision  General bed mobility comments Supervision for safety  Transfers  Overall transfer level Needs assistance  Equipment used Rolling walker (2 wheeled)  Transfers Sit to/from Stand  Sit to Stand From elevated surface;+2 safety/equipment;Mod assist (mod A initially; progressed to min A)  General transfer comment MinA to rise to stand x 2, increased time; after standing, immediately asked to sit  Balance  Overall balance assessment Needs assistance  Sitting-balance support Feet supported  Sitting balance-Leahy Scale Fair  Standing balance support Bilateral upper extremity supported  Standing balance-Leahy Scale Poor  Standing balance comment reliant on external support  OT - End of Session  Equipment Utilized During Treatment Gait belt;Rolling walker  Activity Tolerance Patient tolerated treatment well  Patient left in chair;with call bell/phone within reach;with chair alarm set  Nurse Communication Mobility status  OT Assessment  OT Recommendation/Assessment Patient needs continued OT Services  OT Visit Diagnosis Unsteadiness on feet (R26.81);Other abnormalities of gait and mobility (R26.89);Muscle weakness  (generalized) (M62.81);Other symptoms and signs involving cognitive function;Pain  Pain - Right/Left  (B)  Pain - part of body Knee  OT Problem List Decreased strength;Decreased range of motion;Decreased activity tolerance;Impaired balance (sitting and/or standing);Impaired vision/perception;Decreased coordination;Decreased cognition;Decreased safety awareness;Decreased knowledge of use of DME or AE;Obesity;Impaired UE functional use;Pain  OT Plan  OT Frequency (ACUTE ONLY) Min 2X/week  OT Treatment/Interventions (ACUTE ONLY) Self-care/ADL training;Therapeutic exercise;Neuromuscular education;DME and/or AE instruction;Therapeutic activities;Cognitive remediation/compensation;Visual/perceptual remediation/compensation;Patient/family education;Balance training  AM-PAC OT "6 Clicks" Daily Activity Outcome Measure (Version 2)  Help from another person eating meals? 4  Help from another person taking care of personal grooming? 3  Help from another person toileting, which includes using toliet, bedpan, or urinal? 2  Help from another person bathing (including washing, rinsing, drying)? 2  Help from another person to put on and taking off regular upper body clothing? 3  Help from another person to put on and taking off regular lower body clothing? 2  6 Click Score 16  OT Recommendation  Recommendations for Other Services Rehab consult  Follow Up Recommendations CIR  OT Equipment None recommended by OT  Individuals Consulted  Consulted and Agree with Results and Recommendations Patient  Acute Rehab OT Goals  Patient Stated Goal be able to care for his wife  OT Goal Formulation With patient  Time For Goal Achievement 08/30/20  Potential to Achieve Goals Good  OT Time Calculation  OT Start Time (ACUTE ONLY) 1020  OT Stop Time (ACUTE ONLY) 1037  OT Time Calculation (min) 17 min  OT General Charges  $OT Visit 1 Visit  OT Evaluation  $OT Eval Moderate Complexity 1 Mod  Written Expression   Dominant Hand Right  Luisa Dago, OT/L   Acute OT Clinical Specialist Acute Rehabilitation Services Pager [phone] Office [phone]

## 2020-08-17 LAB — BASIC METABOLIC PANEL
Anion gap: 7 (ref 5–15)
BUN: 9 mg/dL (ref 8–23)
CO2: 24 mmol/L (ref 22–32)
Calcium: 8.7 mg/dL — ABNORMAL LOW (ref 8.9–10.3)
Chloride: 108 mmol/L (ref 98–111)
Creatinine, Ser: 0.85 mg/dL (ref 0.61–1.24)
GFR, Estimated: >60 mL/min (ref >60)
Glucose, Bld: 102 mg/dL — ABNORMAL HIGH (ref 70–99)
Potassium: 3.3 mmol/L — ABNORMAL LOW (ref 3.5–5.1)
Sodium: 139 mmol/L (ref 135–145)

## 2020-08-17 MED ORDER — PANTOPRAZOLE SODIUM 40 MG PO TBEC
40.0000 mg | DELAYED_RELEASE_TABLET | Freq: Every day | ORAL | Status: DC
  Administered 2020-08-17 – 2020-08-19 (×3): 40 mg via ORAL
  Filled 2020-08-17 (×3): qty 1

## 2020-08-17 MED ORDER — APIXABAN 5 MG PO TABS
5.0000 mg | ORAL_TABLET | Freq: Two times a day (BID) | ORAL | Status: DC
  Administered 2020-08-17 – 2020-08-19 (×4): 5 mg via ORAL
  Filled 2020-08-17 (×4): qty 1

## 2020-08-17 NOTE — Progress Notes (Signed)
Transitions of Care Pharmacist Note  [name] is a 80 y.o. male that has been diagnosed with CVA, hx of DVTs and will be prescribed Eliquis (apixaban) at discharge to rehab.  Patient Education: I provided the following education on Eliquis to the patient: How to take the medication Described what the medication is Signs of bleeding Signs/symptoms of VTE and stroke  Answered their questions  Discharge Medications Plan: Patient is discharging to rehab. Benefits check is still in process, follow up on cost and need for patient assistance. Consider filling medications at [facility] pharmacy when patient discharges from rehab. Patient plans to fill maintenance refills with his mail order pharmacy.  Thank you,   Laverna Peace, PharmD PGY-1 Pharmacy Resident 08/17/2020 5:02 PM

## 2020-08-17 NOTE — Plan of Care (Signed)
No acute events this shift VSS O2 stable on RA Telemetry in place, NSR Due scheduled meds given  Mentation intact, no changes noted External condom cath C/D/I, yellow clear urine noted Will continue to monitor and follow plan of care   Problem: Education: Goal: Knowledge of disease or condition will improve Outcome: Progressing Goal: Knowledge of secondary prevention will improve Outcome: Progressing Goal: Knowledge of patient specific risk factors addressed and post discharge goals established will improve Outcome: Progressing   Problem: Health Behavior/Discharge Planning: Goal: Ability to manage health-related needs will improve Outcome: Progressing   Problem: Ischemic Stroke/TIA Tissue Perfusion: Goal: Complications of ischemic stroke/TIA will be minimized Outcome: Progressing

## 2020-08-17 NOTE — Care Management Important Message (Signed)
Important Message  Patient Details  Name: [name] MRN: [id number] Date of Birth: 12-25-1940   Medicare Important Message Given:  Yes     Dorena Bodo 08/17/2020, 3:04 PM
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
## 2020-08-17 NOTE — Progress Notes (Signed)
Physical Therapy Treatment Patient Details Name: [name] MRN: [id number] DOB: [birth year] Today's Date: 08/17/2020    History of Present Illness Patient is a 80 y.o. male who presented to [facility] ED with acute left sided vision loss in conjunction with LUE and LLE numbness and weakness. 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 Unable to receive MRI due to shrapnel in body. PMH: CVA, HTN, PE, arrhythmia and tremor.    PT Comments    Pt received in bed. Pt felt that he could come to EOB independently but was unable to manage covers around feet, min A given as well as increased time to come to EOB safely. Min A +2 for sit>stand from bed and recliner. Pt had difficulty with lower surface. Pt stands with trunk flexion and maintain very low step height during ambulation. Worked on fwd and bkwd walking, 2 bouts 6' each needing seated rest after each due to fatigue. Min A +2 for safety. Continue to recommend CIR at d/c.     Follow Up Recommendations  CIR     Equipment Recommendations  None recommended by PT    Recommendations for Other Services       Precautions / Restrictions Precautions Precautions: Fall;Other (comment) Precaution Comments: L visual field deficit, he reports is pre existing Restrictions Weight Bearing Restrictions: No    Mobility  Bed Mobility Overal bed mobility: Needs Assistance Bed Mobility: Supine to Sit     Supine to sit: Min assist     General bed mobility comments: cued pt to come to EOB independently but he became distracted by self conversation during task and needed redirecting. In addition, was unable to get covers off feet independently. Min A for managing bed while pt came to sitting    Transfers Overall transfer level: Needs assistance Equipment used: Rolling walker (2 wheeled) Transfers: Sit to/from Stand Sit to Stand: From elevated  surface;+2 safety/equipment;Min assist         General transfer comment: min A +2 from bed and recliner. Pt struggled from low recliner despite good arm rests, added pillows to recliner on last sit.  Ambulation/Gait Ambulation/Gait assistance: Min assist;+2 safety/equipment Gait Distance (Feet): 6 Feet (2x) Assistive device: Rolling walker (2 wheeled) Gait Pattern/deviations: Step-through pattern;Decreased stride length;Trunk flexed;Shuffle Gait velocity: decreased Gait velocity interpretation: <1.31 ft/sec, indicative of household ambulator General Gait Details: pt with very low step height and flexed trunk. Needed to sit after first 6' bout due to LE fatigue. Worked on fwd stepping and bkwd stepping. Pt verbalizes safety concerns throughout ambulation and shows better awareness during this activity   Stairs             Wheelchair Mobility    Modified Rankin (Stroke Patients Only) Modified Rankin (Stroke Patients Only) Pre-Morbid Rankin Score: No significant disability Modified Rankin: Moderately severe disability     Balance Overall balance assessment: Needs assistance Sitting-balance support: Feet supported Sitting balance-Leahy Scale: Fair     Standing balance support: Bilateral upper extremity supported Standing balance-Leahy Scale: Poor Standing balance comment: reliant on external support. Pt with slow response to perturbation, not functional for preventing fall                            Cognition Arousal/Alertness: Awake/alert Behavior During Therapy: Flat affect Overall Cognitive Status: Impaired/Different from baseline Area of Impairment: Safety/judgement;Awareness;Attention;Problem solving  Current Attention Level: Selective     Safety/Judgement: Decreased awareness of safety;Decreased awareness of deficits Awareness: Emergent Problem Solving: Slow processing General Comments: Decreased awareness of  safety/deficits. Pt with difficulty describing acute symptoms vs PLOF. Following multi-step commands      Exercises Other Exercises Other Exercises: worked on sliding each hand and then both up wall for shoulder flex stretch as well as trunk extension 4x    General Comments General comments (skin integrity, edema, etc.): VSS on RA. Helped pt call [facility] PT to let them know he is in hospital and will not be able to keep his appt tomorrow.      Pertinent Vitals/Pain Pain Assessment: Faces Faces Pain Scale: Hurts a little bit Pain Location: R knee (chronic) Pain Descriptors / Indicators: Discomfort Pain Intervention(s): Monitored during session    Home Living                      Prior Function            PT Goals (current goals can now be found in the care plan section) Acute Rehab PT Goals Patient Stated Goal: be able to care for his wife PT Goal Formulation: With patient Time For Goal Achievement: 08/30/20 Potential to Achieve Goals: Good    Frequency    Min 4X/week      PT Plan      Co-evaluation              AM-PAC PT "6 Clicks" Mobility   Outcome Measure  Help needed turning from your back to your side while in a flat bed without using bedrails?: None Help needed moving from lying on your back to sitting on the side of a flat bed without using bedrails?: A Little Help needed moving to and from a bed to a chair (including a wheelchair)?: A Little Help needed standing up from a chair using your arms (e.g., wheelchair or bedside chair)?: A Little Help needed to walk in hospital room?: A Lot Help needed climbing 3-5 steps with a railing? : A Lot 6 Click Score: 17    End of Session Equipment Utilized During Treatment: Gait belt Activity Tolerance: Patient tolerated treatment well Patient left: in chair;with call bell/phone within reach;with chair alarm set Nurse Communication: Mobility status PT Visit Diagnosis: Unsteadiness on feet  (R26.81);Difficulty in walking, not elsewhere classified (R26.2)     Time: 2992-4268 PT Time Calculation (min) (ACUTE ONLY): 27 min  Charges:  $Gait Training: 8-22 mins $Therapeutic Activity: 8-22 mins                     Lyanne Co, PT  Acute Rehab Services  Pager [phone] Office [phone]    Lawana Chambers Calob Baskette 08/17/2020, 2:32 PM

## 2020-08-17 NOTE — Plan of Care (Signed)
  Problem: Coping: Goal: Will verbalize positive feelings about self Outcome: Progressing Goal: Will identify appropriate support needs Outcome: Progressing   Problem: Education: Goal: Knowledge of disease or condition will improve Outcome: Progressing Goal: Knowledge of secondary prevention will improve Outcome: Progressing Goal: Knowledge of patient specific risk factors addressed and post discharge goals established will improve Outcome: Progressing Goal: Individualized Educational Video(s) Outcome: Progressing   Problem: Ischemic Stroke/TIA Tissue Perfusion: Goal: Complications of ischemic stroke/TIA will be minimized Outcome: Progressing   

## 2020-08-17 NOTE — Progress Notes (Signed)
Inpatient Rehabilitation Admissions Coordinator  I met at bedside with patient for rehab assessment. I am familiar with patient from his previous admit to CIR 09/2018. We discussed goals and expectations of a possible CIR admit pending insurance approval. He is in agreement. His 79 year old wife has some memory issues and pt does not have a [place]. His stepson, Camrin Gearheart, patient feels has too much on him. I spoke with patient's  wife, Thayer Headings by phone , to request she provide David's phone number in case of an emergency that he can also be contacted. Thayer Headings does not drive. She will discuss with Shanon Brow and provide me the number after that discussion. I will begin insurance authorization for a possible CIR admit.  Danne Baxter, RN, MSN Rehab Admissions Coordinator [phone] 08/17/2020 11:10 AM

## 2020-08-17 NOTE — Discharge Instructions (Signed)
Information on my medicine - ELIQUIS (apixaban)  This medication education was reviewed with me or my healthcare representative as part of my discharge preparation.   Why was Eliquis prescribed for you? Eliquis was prescribed for your past history of clot in the veins of your legs (deep vein thrombosis) or in your lungs (pulmonary embolism) and to reduce the risk of them occurring again.  What do You need to know about Eliquis ? The the dose is  ONE 5 mg tablet taken TWICE daily.  Eliquis may be taken with or without food. Important to take medicine as prescribed and not miss any doses.  Try to take the dose about the same time in the morning and in the evening. If you have difficulty swallowing the tablet whole please discuss with your pharmacist how to take the medication safely.  Take Eliquis exactly as prescribed and DO NOT stop taking Eliquis without talking to the doctor who prescribed the medication.  Stopping may increase your risk of developing a new blood clot.  Refill your prescription before you run out.  After discharge, you should have regular check-up appointments with your healthcare provider that is prescribing your Eliquis.    What do you do if you miss a dose? If a dose of ELIQUIS is not taken at the scheduled time, take it as soon as possible on the same day and twice-daily administration should be resumed. The dose should not be doubled to make up for a missed dose.  Important Safety Information A possible side effect of Eliquis is bleeding. You should call your healthcare provider right away if you experience any of the following: ? Bleeding from an injury or your nose that does not stop. ? Unusual colored urine (red or dark brown) or unusual colored stools (red or black). ? Unusual bruising for unknown reasons. ? A serious fall or if you hit your head (even if there is no bleeding).  Some medicines may interact with Eliquis and might increase your risk of  bleeding or clotting while on Eliquis. To help avoid this, consult your healthcare provider or pharmacist prior to using any new prescription or non-prescription medications, including herbals, vitamins, non-steroidal anti-inflammatory drugs (NSAIDs) and supplements.  This website has more information on Eliquis (apixaban): http://[email or web address].com/eliquis/home

## 2020-08-18 ENCOUNTER — Other Ambulatory Visit (HOSPITAL_COMMUNITY): Payer: Self-pay

## 2020-08-18 LAB — BASIC METABOLIC PANEL
Anion gap: 6 (ref 5–15)
BUN: 11 mg/dL (ref 8–23)
CO2: 24 mmol/L (ref 22–32)
Calcium: 8.7 mg/dL — ABNORMAL LOW (ref 8.9–10.3)
Chloride: 108 mmol/L (ref 98–111)
Creatinine, Ser: 0.84 mg/dL (ref 0.61–1.24)
GFR, Estimated: >60 mL/min (ref >60)
Glucose, Bld: 105 mg/dL — ABNORMAL HIGH (ref 70–99)
Potassium: 3.5 mmol/L (ref 3.5–5.1)
Sodium: 138 mmol/L (ref 135–145)
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
## 2020-08-18 NOTE — TOC Benefit Eligibility Note (Signed)
Patient Advocate Encounter  Insurance verification completed.    The patient is currently admitted and upon discharge could be taking Eliquis 5 mg.  The current 30 day co-pay is, $47.00.   The patient is insured through [employer] Medicare Part D    Apryle Stowell, CPhT Pharmacy Patient Advocate Specialist [place] Antimicrobial Stewardship Team Direct Number: [phone]  Fax: [phone]        
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
## 2020-08-18 NOTE — Progress Notes (Signed)
Inpatient Rehabilitation Admissions Coordinator  I received calls from both patient  and his wife asking to clarify [facility] medicare policy number and son's contact information in case of emergency. I have begun insurance Auth with [facility] medicare yesterday. I have explained to both patient and his wife that I have begun this Auth and there is nothing else they need to provide or do to facilitate this process.   Ottie Glazier, RN, MSN Rehab Admissions Coordinator [phone] 08/18/2020 1:55 PM

## 2020-08-19 ENCOUNTER — Inpatient Hospital Stay (HOSPITAL_COMMUNITY)
Admission: RE | Admit: 2020-08-19 | Discharge: 2020-08-26 | DRG: 057 | Disposition: A | Discharge status: Home / Self Care | Payer: Medicare Other | Source: Intra-hospital | Attending: Physical Medicine and Rehabilitation | Admitting: Physical Medicine and Rehabilitation

## 2020-08-19 ENCOUNTER — Encounter (HOSPITAL_COMMUNITY): Payer: Self-pay | Admitting: Physical Medicine & Rehabilitation

## 2020-08-19 ENCOUNTER — Other Ambulatory Visit: Payer: Self-pay

## 2020-08-19 DIAGNOSIS — E876 Hypokalemia: Secondary | ICD-10-CM | POA: Diagnosis present

## 2020-08-19 DIAGNOSIS — M25562 Pain in left knee: Secondary | ICD-10-CM | POA: Diagnosis present

## 2020-08-19 DIAGNOSIS — Z91030 Bee allergy status: Secondary | ICD-10-CM | POA: Diagnosis not present

## 2020-08-19 DIAGNOSIS — Z713 Dietary counseling and surveillance: Secondary | ICD-10-CM

## 2020-08-19 DIAGNOSIS — K5900 Constipation, unspecified: Secondary | ICD-10-CM | POA: Diagnosis present

## 2020-08-19 DIAGNOSIS — Z91018 Allergy to other foods: Secondary | ICD-10-CM

## 2020-08-19 DIAGNOSIS — K219 Gastro-esophageal reflux disease without esophagitis: Secondary | ICD-10-CM | POA: Diagnosis present

## 2020-08-19 DIAGNOSIS — Z91011 Allergy to milk products: Secondary | ICD-10-CM | POA: Diagnosis not present

## 2020-08-19 DIAGNOSIS — N400 Enlarged prostate without lower urinary tract symptoms: Secondary | ICD-10-CM | POA: Diagnosis present

## 2020-08-19 DIAGNOSIS — I639 Cerebral infarction, unspecified: Secondary | ICD-10-CM | POA: Diagnosis present

## 2020-08-19 DIAGNOSIS — Z8673 Personal history of transient ischemic attack (TIA), and cerebral infarction without residual deficits: Secondary | ICD-10-CM

## 2020-08-19 DIAGNOSIS — Z880 Allergy status to penicillin: Secondary | ICD-10-CM

## 2020-08-19 DIAGNOSIS — H5340 Unspecified visual field defects: Secondary | ICD-10-CM

## 2020-08-19 DIAGNOSIS — G252 Other specified forms of tremor: Secondary | ICD-10-CM | POA: Diagnosis present

## 2020-08-19 DIAGNOSIS — Z86718 Personal history of other venous thrombosis and embolism: Secondary | ICD-10-CM | POA: Diagnosis not present

## 2020-08-19 DIAGNOSIS — I69354 Hemiplegia and hemiparesis following cerebral infarction affecting left non-dominant side: Principal | ICD-10-CM

## 2020-08-19 DIAGNOSIS — D649 Anemia, unspecified: Secondary | ICD-10-CM

## 2020-08-19 DIAGNOSIS — E041 Nontoxic single thyroid nodule: Secondary | ICD-10-CM | POA: Diagnosis present

## 2020-08-19 DIAGNOSIS — E669 Obesity, unspecified: Secondary | ICD-10-CM | POA: Diagnosis present

## 2020-08-19 DIAGNOSIS — I10 Essential (primary) hypertension: Secondary | ICD-10-CM | POA: Diagnosis present

## 2020-08-19 DIAGNOSIS — E8809 Other disorders of plasma-protein metabolism, not elsewhere classified: Secondary | ICD-10-CM | POA: Diagnosis present

## 2020-08-19 DIAGNOSIS — M25561 Pain in right knee: Secondary | ICD-10-CM | POA: Diagnosis present

## 2020-08-19 DIAGNOSIS — I69392 Facial weakness following cerebral infarction: Secondary | ICD-10-CM

## 2020-08-19 DIAGNOSIS — Z8249 Family history of ischemic heart disease and other diseases of the circulatory system: Secondary | ICD-10-CM | POA: Diagnosis not present

## 2020-08-19 DIAGNOSIS — I69312 Visuospatial deficit and spatial neglect following cerebral infarction: Secondary | ICD-10-CM | POA: Diagnosis not present

## 2020-08-19 DIAGNOSIS — Z79899 Other long term (current) drug therapy: Secondary | ICD-10-CM

## 2020-08-19 DIAGNOSIS — G250 Essential tremor: Secondary | ICD-10-CM | POA: Diagnosis not present

## 2020-08-19 DIAGNOSIS — Z7901 Long term (current) use of anticoagulants: Secondary | ICD-10-CM

## 2020-08-19 DIAGNOSIS — G8194 Hemiplegia, unspecified affecting left nondominant side: Secondary | ICD-10-CM | POA: Diagnosis not present

## 2020-08-19 DIAGNOSIS — R600 Localized edema: Secondary | ICD-10-CM | POA: Diagnosis present

## 2020-08-19 DIAGNOSIS — Z886 Allergy status to analgesic agent status: Secondary | ICD-10-CM

## 2020-08-19 DIAGNOSIS — Z89431 Acquired absence of right foot: Secondary | ICD-10-CM

## 2020-08-19 DIAGNOSIS — I69318 Other symptoms and signs involving cognitive functions following cerebral infarction: Secondary | ICD-10-CM | POA: Diagnosis not present

## 2020-08-19 DIAGNOSIS — Z5309 Procedure and treatment not carried out because of other contraindication: Secondary | ICD-10-CM

## 2020-08-19 DIAGNOSIS — Z6832 Body mass index (BMI) 32.0-32.9, adult: Secondary | ICD-10-CM

## 2020-08-19 DIAGNOSIS — Z86711 Personal history of pulmonary embolism: Secondary | ICD-10-CM

## 2020-08-19 LAB — BASIC METABOLIC PANEL
Anion gap: 9 (ref 5–15)
BUN: 16 mg/dL (ref 8–23)
CO2: 23 mmol/L (ref 22–32)
Calcium: 8.8 mg/dL — ABNORMAL LOW (ref 8.9–10.3)
Chloride: 104 mmol/L (ref 98–111)
Creatinine, Ser: 0.97 mg/dL (ref 0.61–1.24)
GFR, Estimated: >60 mL/min (ref >60)
Glucose, Bld: 109 mg/dL — ABNORMAL HIGH (ref 70–99)
Potassium: 3.6 mmol/L (ref 3.5–5.1)
Sodium: 136 mmol/L (ref 135–145)

## 2020-08-19 MED ORDER — BISACODYL 10 MG RE SUPP: 10.0000 mg | Freq: Every day | RECTAL | Status: DC | PRN

## 2020-08-19 MED ORDER — PANTOPRAZOLE SODIUM 40 MG PO TBEC: 40.0000 mg | DELAYED_RELEASE_TABLET | Freq: Every day | ORAL | Status: AC

## 2020-08-19 MED ORDER — LIDOCAINE 4 % EX CREA
TOPICAL_CREAM | Freq: Three times a day (TID) | CUTANEOUS | Status: DC | PRN
  Administered 2020-08-20 – 2020-08-25 (×2): 1 via TOPICAL
  Filled 2020-08-19 (×3): qty 5

## 2020-08-19 MED ORDER — TAMSULOSIN HCL 0.4 MG PO CAPS
0.4000 mg | ORAL_CAPSULE | Freq: Every day | ORAL | Status: DC
  Administered 2020-08-19 – 2020-08-23 (×5): 0.4 mg via ORAL
  Filled 2020-08-19 (×6): qty 1

## 2020-08-19 MED ORDER — SENNOSIDES-DOCUSATE SODIUM 8.6-50 MG PO TABS
1.0000 | ORAL_TABLET | ORAL | Status: DC
  Administered 2020-08-20 – 2020-08-22 (×2): 1 via ORAL
  Filled 2020-08-19 (×4): qty 1

## 2020-08-19 MED ORDER — GUAIFENESIN-DM 100-10 MG/5ML PO SYRP
5.0000 mL | ORAL_SOLUTION | Freq: Four times a day (QID) | ORAL | Status: DC | PRN
  Filled 2020-08-19: qty 10

## 2020-08-19 MED ORDER — PROCHLORPERAZINE EDISYLATE 10 MG/2ML IJ SOLN: 5.0000 mg | Freq: Four times a day (QID) | INTRAMUSCULAR | Status: DC | PRN

## 2020-08-19 MED ORDER — EXERCISE FOR HEART AND HEALTH BOOK
Freq: Once | Status: AC
  Filled 2020-08-19: qty 1

## 2020-08-19 MED ORDER — APIXABAN 5 MG PO TABS: 5.0000 mg | ORAL_TABLET | Freq: Two times a day (BID) | ORAL | Status: DC

## 2020-08-19 MED ORDER — FLEET ENEMA 7-19 GM/118ML RE ENEM: 1.0000 | ENEMA | Freq: Once | RECTAL | Status: DC | PRN

## 2020-08-19 MED ORDER — DICLOFENAC SODIUM 1 % EX GEL
2.0000 g | Freq: Four times a day (QID) | CUTANEOUS | Status: DC
  Administered 2020-08-19 – 2020-08-25 (×15): 2 g via TOPICAL
  Filled 2020-08-19: qty 100

## 2020-08-19 MED ORDER — ALUM & MAG HYDROXIDE-SIMETH 200-200-20 MG/5ML PO SUSP: 30.0000 mL | ORAL | Status: DC | PRN

## 2020-08-19 MED ORDER — PANTOPRAZOLE SODIUM 40 MG PO TBEC
40.0000 mg | DELAYED_RELEASE_TABLET | Freq: Every day | ORAL | Status: DC
  Administered 2020-08-20 – 2020-08-21 (×2): 40 mg via ORAL
  Filled 2020-08-19 (×2): qty 1

## 2020-08-19 MED ORDER — PROPRANOLOL HCL 20 MG PO TABS
10.0000 mg | ORAL_TABLET | Freq: Three times a day (TID) | ORAL | Status: DC
  Administered 2020-08-19 – 2020-08-21 (×5): 10 mg via ORAL
  Filled 2020-08-19 (×5): qty 1

## 2020-08-19 MED ORDER — PROCHLORPERAZINE MALEATE 5 MG PO TABS: 5.0000 mg | ORAL_TABLET | Freq: Four times a day (QID) | ORAL | Status: DC | PRN

## 2020-08-19 MED ORDER — LIDOCAINE 4 % EX CREA: TOPICAL_CREAM | Freq: Three times a day (TID) | CUTANEOUS | 0 refills | Status: DC | PRN

## 2020-08-19 MED ORDER — DICLOFENAC SODIUM 1 % EX GEL: 2.0000 g | Freq: Four times a day (QID) | CUTANEOUS | Status: DC

## 2020-08-19 MED ORDER — APIXABAN 5 MG PO TABS
5.0000 mg | ORAL_TABLET | Freq: Two times a day (BID) | ORAL | Status: DC
  Administered 2020-08-19 – 2020-08-26 (×14): 5 mg via ORAL
  Filled 2020-08-19 (×15): qty 1

## 2020-08-19 MED ORDER — PROCHLORPERAZINE 25 MG RE SUPP: 12.5000 mg | Freq: Four times a day (QID) | RECTAL | Status: DC | PRN

## 2020-08-19 MED ORDER — ACETAMINOPHEN 325 MG PO TABS: 325.0000 mg | ORAL_TABLET | ORAL | Status: DC | PRN

## 2020-08-19 MED ORDER — DIPHENHYDRAMINE HCL 12.5 MG/5ML PO ELIX: 12.5000 mg | ORAL_SOLUTION | Freq: Four times a day (QID) | ORAL | Status: DC | PRN

## 2020-08-19 MED ORDER — TRAZODONE HCL 50 MG PO TABS
25.0000 mg | ORAL_TABLET | Freq: Every evening | ORAL | Status: DC | PRN
  Administered 2020-08-20 – 2020-08-25 (×4): 50 mg via ORAL
  Filled 2020-08-19 (×5): qty 1

## 2020-08-19 MED ORDER — POLYETHYLENE GLYCOL 3350 17 G PO PACK: 17.0000 g | PACK | Freq: Every day | ORAL | Status: DC | PRN

## 2020-08-19 MED ORDER — ATORVASTATIN CALCIUM 10 MG PO TABS
20.0000 mg | ORAL_TABLET | Freq: Every day | ORAL | Status: DC
  Administered 2020-08-19 – 2020-08-23 (×5): 20 mg via ORAL
  Filled 2020-08-19 (×6): qty 2

## 2020-08-19 MED ORDER — ASCORBIC ACID 500 MG PO TABS
500.0000 mg | ORAL_TABLET | Freq: Two times a day (BID) | ORAL | Status: DC
  Administered 2020-08-19 – 2020-08-26 (×13): 500 mg via ORAL
  Filled 2020-08-19 (×16): qty 1

## 2020-08-19 NOTE — Progress Notes (Signed)
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 recommended care.   Comment: Thank you for this consult. Admission coordinator to follow.    I have personally performed a face to face diagnostic evaluation, including, but not limited to relevant history and physical exam findings, of this patient and developed relevant assessment and plan.  Additionally, I have reviewed and concur with the physician assistant's documentation above.   Sula Soda, MD   Mcarthur Rossetti Angiulli, PA-C 08/16/2020      Revision History                             Routing History  Note Details  Author Carlis Abbott, Drema Pry, MD File Time 08/16/2020  2:02 PM  Author Type Physician Status Addendum  Last Editor Horton Chin, MD Service Physical Medicine and Rehabilitation  Hospital Acct # [id number] Admit Date 08/19/2020

## 2020-08-19 NOTE — Progress Notes (Signed)
Inpatient Rehabilitation Medication Review by a Pharmacist  A complete drug regimen review was completed for this patient to identify any potential clinically significant medication issues.  Clinically significant medication issues were identified:  no  Check AMION for pharmacist assigned to patient if future medication questions/issues arise during this admission.  Pharmacist comments:   Time spent performing this drug regimen review (minutes):  15   Jakiah Bienaime 08/19/2020 7:24 PM
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
## 2020-08-19 NOTE — Progress Notes (Signed)
Inpatient Rehabilitation Admissions Coordinator  I have insurance approval and CIR bed to admit patient today. I will make the arrangements to admit and patient is aware and in agreement.  Dr Pearlean Brownie, acute team and [facility] aware.  Ottie Glazier, RN, MSN Rehab Admissions Coordinator [phone] 08/19/2020 3:01 PM
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
## 2020-08-19 NOTE — Progress Notes (Signed)
Inpatient Rehabilitation Admissions Coordinator  I have requested peer to peer with Dr. Pearlean Brownie and Fransico Him MD to discuss our CIR recommendations. I await that determination /approval for CIR to proceed.  Ottie Glazier, RN, MSN Rehab Admissions Coordinator [phone] 08/19/2020 11:46 AM

## 2020-08-19 NOTE — Progress Notes (Signed)
INPATIENT REHABILITATION ADMISSION NOTE   Arrival Method: wheelchair     Mental Orientation:alert    Assessment:done   Skin: done   IV'S:right forearm   Pain:none   Tubes and Drains:none   Safety Measures:done   Vital Signs:done  Height and Weight:6"3; 115.8 kg   Rehab Orientation:done   Family:not  With patient    Notes:
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
## 2020-08-20 DIAGNOSIS — G8194 Hemiplegia, unspecified affecting left nondominant side: Secondary | ICD-10-CM

## 2020-08-20 LAB — CBC WITH DIFFERENTIAL/PLATELET
Abs Immature Granulocytes: 0.02 10*3/uL (ref 0.00–0.07)
Basophils Absolute: 0 10*3/uL (ref 0.0–0.1)
Basophils Relative: 1 %
Eosinophils Absolute: 0.7 10*3/uL — ABNORMAL HIGH (ref 0.0–0.5)
Eosinophils Relative: 9 %
HCT: 38.3 % — ABNORMAL LOW (ref 39.0–52.0)
Hemoglobin: 12.5 g/dL — ABNORMAL LOW (ref 13.0–17.0)
Immature Granulocytes: 0 %
Lymphocytes Relative: 35 %
Lymphs Abs: 3 10*3/uL (ref 0.7–4.0)
MCH: 31.8 pg (ref 26.0–34.0)
MCHC: 32.6 g/dL (ref 30.0–36.0)
MCV: 97.5 fL (ref 80.0–100.0)
Monocytes Absolute: 0.7 10*3/uL (ref 0.1–1.0)
Monocytes Relative: 8 %
Neutro Abs: 4.1 10*3/uL (ref 1.7–7.7)
Neutrophils Relative %: 47 %
Platelets: 218 10*3/uL (ref 150–400)
RBC: 3.93 MIL/uL — ABNORMAL LOW (ref 4.22–5.81)
RDW: 13 % (ref 11.5–15.5)
WBC: 8.5 10*3/uL (ref 4.0–10.5)
nRBC: 0 % (ref 0.0–0.2)

## 2020-08-20 LAB — COMPREHENSIVE METABOLIC PANEL
ALT: 10 U/L (ref 0–44)
AST: 15 U/L (ref 15–41)
Albumin: 2.9 g/dL — ABNORMAL LOW (ref 3.5–5.0)
Alkaline Phosphatase: 41 U/L (ref 38–126)
Anion gap: 7 (ref 5–15)
BUN: 15 mg/dL (ref 8–23)
CO2: 25 mmol/L (ref 22–32)
Calcium: 8.9 mg/dL (ref 8.9–10.3)
Chloride: 105 mmol/L (ref 98–111)
Creatinine, Ser: 0.94 mg/dL (ref 0.61–1.24)
GFR, Estimated: >60 mL/min (ref >60)
Glucose, Bld: 98 mg/dL (ref 70–99)
Potassium: 3.5 mmol/L (ref 3.5–5.1)
Sodium: 137 mmol/L (ref 135–145)
Total Bilirubin: 1.1 mg/dL (ref 0.3–1.2)
Total Protein: 5.6 g/dL — ABNORMAL LOW (ref 6.5–8.1)
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
## 2020-08-20 NOTE — Progress Notes (Signed)
Patient ID: [name], male   DOB: [birth year], 80 y.o.   MRN: [id number] Met with the patient to review the role of the nurse CM and initiate patient education. Reviewed secondary stroke risks and touched on HTN, DASH diet, HLD and statin, and importance of taking medications. Patient has reflected on changes he needs to make and dietary modifications. Felt like he was doing well other than not taking Eliquis but was taking Vitamin B. Noted he was eating processed meals and open to cooking more at home and decreasing prepackaged foods. Patient given tips on cooking with less salt and using herbs, spices and seasonings. He was active PTA; swimming, and managed intake and managed his own meds as well as meds for his wife. Continue to follow along to discharge to address educational needs. Also collaborate with the SW to facilitate preparation for discharge. Margarito Liner, RN

## 2020-08-20 NOTE — Progress Notes (Signed)
Inpatient Rehabilitation Center Individual Statement of Services  Patient Name:  [name]  Date:  08/20/2020  Welcome to the Inpatient Rehabilitation Center.  Our goal is to provide you with an individualized program based on your diagnosis and situation, designed to meet your specific needs.  With this comprehensive rehabilitation program, you will be expected to participate in at least 3 hours of rehabilitation therapies Monday-Friday, with modified therapy programming on the weekends.  Your rehabilitation program will include the following services:  Physical Therapy (PT), Occupational Therapy (OT), 24 hour per day rehabilitation nursing, Neuropsychology, Care Coordinator, Rehabilitation Medicine, Nutrition Services and Pharmacy Services  Weekly team conferences will be held on Wednesday to discuss your progress.  Your Inpatient Rehabilitation Care Coordinator will talk with you frequently to get your input and to update you on team discussions.  Team conferences with you and your family in attendance may also be held.  Expected length of stay: 10-12  days  Overall anticipated outcome: supervision with cueing  Depending on your progress and recovery, your program may change. Your Inpatient Rehabilitation Care Coordinator will coordinate services and will keep you informed of any changes. Your Inpatient Rehabilitation Care Coordinator's name and contact numbers are listed  below.  The following services may also be recommended but are not provided by the Inpatient Rehabilitation Center:   Driving Evaluations  Home Health Rehabiltiation Services  Outpatient Rehabilitation Services    Arrangements will be made to provide these services after discharge if needed.  Arrangements include referral to agencies that provide these services.  Your insurance has been verified to be:  [employer]-Medicare Your primary doctor is:  Myles Lipps  Pertinent information will be shared with your doctor  and your insurance company.  Inpatient Rehabilitation Care Coordinator:  Dossie Der, [place] [phone] or Luna Glasgow  Information discussed with and copy given to patient by: Lucy Chris, 08/20/2020, 11:54 AM

## 2020-08-20 NOTE — Progress Notes (Signed)
PROGRESS NOTE   Subjective/Complaints: No complaints Moving to [facility] today Did well with therapy Eager to return home to be with his wife  ROS:  +bilateral knee pain  Objective:   No results found. Recent Labs    08/20/20 0530  WBC 8.5  HGB 12.5*  HCT 38.3*  PLT 218   Recent Labs    08/19/20 0322 08/20/20 0530  NA 136 137  K 3.6 3.5  CL 104 105  CO2 23 25  GLUCOSE 109* 98  BUN 16 15  CREATININE 0.97 0.94  CALCIUM 8.8* 8.9    Intake/Output Summary (Last 24 hours) at 08/20/2020 1138 Last data filed at 08/20/2020 0820 Gross per 24 hour  Intake 440 ml  Output 1101 ml  Net -661 ml        Physical Exam: Vital Signs Blood pressure 101/66, pulse 68, temperature 98 F (36.7 C), temperature source Oral, resp. rate 14, weight 118.5 kg, SpO2 93 %. Gen: no distress, normal appearing HEENT: oral mucosa pink and moist, Comments: Slightly decreased L visual field vision  Cardiovascular:     Rate and Rhythm: Normal rate and regular rhythm.     Heart sounds: Normal heart sounds. No murmur heard. No gallop.   Pulmonary:     Comments: CTA B/L- no W/R/R- good air movement Abdominal:     Comments: Soft, NT, ND, (+)BS    Genitourinary:    Comments: Condom catheter in place- medium amber urine Musculoskeletal:     Cervical back: Normal range of motion. No rigidity.     Comments: LUE 5-/5- biceps, triceps, WE, grip and finger abd RUE 5/5 LLE- 4+/5 in HF, KE, DF and PF RLE_ 5/5 in same muscles Very TTP in R>L knee- esp medially ("needs knee replacements")  Skin:    General: Skin is warm and dry.     Comments: IV in R forearm -looks OK  Neurological:     Mental Status: He is alert and oriented to person, place, and time.     Comments: Tangential --question decreased hearing. Able to follow simple motor commands. Intentional tremors noted.   Hard to keep on track- L facial numbness/impaired sensation, but intact in  all 4 extremities   Psychiatric:     Comments: Flat, appropriate, tangential      Assessment/Plan: 1. Functional deficits which require 3+ hours per day of interdisciplinary therapy in a comprehensive inpatient rehab setting.  Physiatrist is providing close team supervision and 24 hour management of active medical problems listed below.  Physiatrist and rehab team continue to assess barriers to discharge/monitor patient progress toward functional and medical goals  Care Tool:  Bathing              Bathing assist       Upper Body Dressing/Undressing Upper body dressing        Upper body assist Assist Level: Set up assist    Lower Body Dressing/Undressing Lower body dressing            Lower body assist Assist for lower body dressing: Moderate Assistance - Patient 50 - 74%     Toileting Toileting    Toileting assist Assist for toileting:  Independent with assistive device Assistive Device Comment: front wheel walker   Transfers Chair/bed transfer  Transfers assist  Chair/bed transfer activity did not occur: Safety/medical concerns  Chair/bed transfer assist level: Contact Guard/Touching assist     Locomotion Ambulation   Ambulation assist      Assist level: Contact Guard/Touching assist Assistive device: Walker-rolling Max distance: 263ft   Walk 10 feet activity   Assist     Assist level: Contact Guard/Touching assist Assistive device: Walker-rolling   Walk 50 feet activity   Assist    Assist level: Contact Guard/Touching assist Assistive device: Walker-rolling    Walk 150 feet activity   Assist    Assist level: Contact Guard/Touching assist Assistive device: Walker-rolling    Walk 10 feet on uneven surface  activity   Assist Walk 10 feet on uneven surfaces activity did not occur: Safety/medical concerns         Wheelchair     Assist Will patient use wheelchair at discharge?: No   Wheelchair activity did  not occur: N/A         Wheelchair 50 feet with 2 turns activity    Assist    Wheelchair 50 feet with 2 turns activity did not occur: N/A       Wheelchair 150 feet activity     Assist  Wheelchair 150 feet activity did not occur: N/A       Blood pressure 101/66, pulse 68, temperature 98 F (36.7 C), temperature source Oral, resp. rate 14, weight 118.5 kg, SpO2 93 %.    Medical Problem List and Plan: 1.  L hemiparesis with L visual field deficit secondary to new likely R brain stroke (cannot have MRI)             -patient may  shower             -ELOS/Goals- 10-14 days- mod I to supervision  -Initial CIR evals today  -stroke education provided 2.  Antithrombotics: Continue Eliquis             -antiplatelet therapy: N/A 3. Headaches/Pain Management: Tylenol prn effective for HA             --resume Voltaren gel to knees.  4. Mood: LCSW to follow for evaluation and support.              -antipsychotic agents: N/A 5. Neuropsych: This patient maybe capable of making decisions on his own behalf. 6. Skin/Wound Care: Routine pressure relief measures. Discontinue IV. 7. Fluids/Electrolytes/Nutrition: Monitor I/O.  8. BPH: Continue FLomax daily.  9. H/o Tremors: Managed with inderal tid 10. Constipation: Will schedule Senna S one every other day.  11. Transient hypokalemia: Recheck CMET in am.  12. H/o Peripheral edema:  Resume support stocking.   13. Hypoalbuminemia: encourage high protein foods.    LOS: 1 days A FACE TO FACE EVALUATION WAS PERFORMED  Drema Pry Ambert Virrueta 08/20/2020, 11:38 AM
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
## 2020-08-20 NOTE — Progress Notes (Signed)
Patient information reviewed and entered into [facility] System by Becky Kainen Struckman, PPS coordinator. Information including medical coding, function ability, and quality indicators will be reviewed and updated through discharge.   

## 2020-08-21 MED ORDER — PROPRANOLOL HCL 20 MG PO TABS
10.0000 mg | ORAL_TABLET | Freq: Two times a day (BID) | ORAL | Status: DC
  Administered 2020-08-21 – 2020-08-26 (×10): 10 mg via ORAL
  Filled 2020-08-21 (×10): qty 1

## 2020-08-21 MED ORDER — PANTOPRAZOLE SODIUM 40 MG PO TBEC: 40.0000 mg | DELAYED_RELEASE_TABLET | Freq: Every day | ORAL | Status: DC | PRN

## 2020-08-21 NOTE — Progress Notes (Signed)
Physical Therapy Session Note  Patient Details  Name: [name] MRN: [id number] Date of Birth: 09/09/1940  Today's Date: 08/21/2020 PT Individual Time:1452  - 1536, 44 min     Short Term Goals: Week 1:  PT Short Term Goal 1 (Week 1): STG = LTG due to ELOS  Skilled Therapeutic Interventions/Progress Updates:  Pt reclined in recliner.  Pt denied pain; premedicated. He requested using toilet .  Sit> stand to RW from recliner with CGA.  Gait training on level tile with RW into BR, CGA.  Pt declined using BSC.  Stand> sit to toilet using R wall bar, CGA.  Continent of bladder.  Hand washing at sink with supervision. Pt donned slippers and pants in sitting/standing with set up and CGA.  Gait training with RW on level tile x 150' including turns R/L; CGA.  Cues for upright trunk and forward gaze.  Cues for visual scanning for room numbers /signs on L; max cues needed for signs above eye level.    neuromuscular re-education via demo and multimodal cues for standing with bil UE support on footboard of bed, for bil heel raises, toe raises.  Minimal excursion noted LLE for PF or DF.  Pt doffed pants in sitting> standing with RW, close supervision.   At end of session, pt reclined in recliner with needs at hand and seat pad alarm set.      Therapy Documentation Precautions:  Precautions Precautions: Fall Precaution Comments: L visual field deficits. Verbose and tangential at times Restrictions Weight Bearing Restrictions: No       Therapy/Group: Individual Therapy  Daune Divirgilio 08/21/2020, 2:52 PM

## 2020-08-21 NOTE — Progress Notes (Signed)
Speech Language Pathology Daily Session Note  Patient Details  Name: MATEEN FRANSSEN MRN: [id number] Date of Birth: [birth year]  Today's Date: 08/21/2020 SLP Individual Time: 1517-6160 SLP Individual Time Calculation (min): 50 min  Short Term Goals: Week 1: SLP Short Term Goal 1 (Week 1): Pt will demonstrate sustained attention to task as well on topic for 10 minutes with supervision A verbal cues. SLP Short Term Goal 2 (Week 1): Pt will demonstrate mildly complex problem solving skills in functional tasks with min A verbal cues. SLP Short Term Goal 3 (Week 1): Pt will self-monitor and self-correct functional errors mod I. SLP Short Term Goal 4 (Week 1): Pt will scan left of midline during functional tasks with supervision A verbal cues. SLP Short Term Goal 5 (Week 1): Pt will recall daily, novel events with supervision A verbal cues for written aids.  Skilled Therapeutic Interventions:Skilled ST services focused on cognitive skills. Pt required moderate assistance to read medication list due to left field cut. SLP facilitated mildly complex problem solving, error awareness and recall skills in TID pill organizer task. Pt demonstrated reduced insight into acute deficits and required extensive education how to complete novel TID pill organizer, perseverating on old system that appeared confusing to SLP. When pt finally began filling out organizer, pt required mod A verbal cues for error awareness, filling in multiple pills for one day. MD entered and pt discussed medication changes, which MD agreed to reduce x3 medication x2 a day. Pt expressed not to contact son, but can contact wife and friend Thresa Ross to talk about supervision. Due to limited time and pt's internal distractions, SLP will complete BID pill organizer next session. Pt was left in room with call bell within reach and bed alarm set. SLP recommends to continue skilled services.  As of note SLP communicated with OT that consisently  treated pt when on CIR in 2020, whom supports pragmatic behaviors are baseline including tangential speech.      Pain Pain Assessment Pain Scale: 0-10 Pain Score: 0-No pain  Therapy/Group: Individual Therapy  Timtohy Broski  [facility] 08/21/2020, 12:35 PM

## 2020-08-21 NOTE — Progress Notes (Signed)
Occupational Therapy Session Note  Patient Details  Name: [name] MRN: [id number] Date of Birth: 1940-07-06  Today's Date: 08/21/2020 OT Individual Time: 0700-0800 OT Individual Time Calculation (min): 60 min    Today's Date: 08/21/2020 OT Individual Time: 1130-1200 OT Individual Time Calculation (min): 30 min     Short Term Goals: Week 1:  OT Short Term Goal 1 (Week 1): Pt will transfer to toilet with CGA OT Short Term Goal 2 (Week 1): Pt will don pants wiht CGA OT Short Term Goal 3 (Week 1): Pt will don B socks with AE PRN OT Short Term Goal 4 (Week 1): Pt will groom in standing with CGA  Skilled Therapeutic Interventions/Progress Updates:     Pt received in knee pain from "twisting it." Pt agreeable to shower  ADL:  Pt completes bathing with S for sit to stand from TTB in walk in shower Pt completes UB dressing with MIN A sit to stand Pt unable to complete LB dressing d/t time constraints (gown donned over shirt Pt completes footwear with total A d/t time constraints Pt completes toileting with MIN A using RW and BSC over toilet and significantly increased time d/t verbosity, decreased attention to task and attempt to void bowel (unsuccessful). Pt completes toileting transfer with MIN A for ambulation from bed to bathroom Pt completes shower/Tub transfer with MIN A amb from toilet to TTB in shower   Pt left at end of session in bed with exit alarm on, call light in reach and all needs met   SESSION 2: Pt received in bed agreeable to OT. Pt completes standing urination with urinal and MIN A at EOB and ambulates to bathroom with CGA to attempt toileting for BM, however unsuccessful. Pt transfers back to recliner as stated above and OT shaves face d/t increased tremors and pt on blood thinners. Pt likely able to attempt later in the week since hair is not as long now. Exited session with pt seated in bed, exit alarm on and call light in reach   Therapy  Documentation Precautions:  Precautions Precautions: Fall Precaution Comments: L visual field deficits. Verbose and tangential at times Restrictions Weight Bearing Restrictions: No General:   Vital Signs: Therapy Vitals Temp: 97.9 F (36.6 C) Temp Source: Oral Pulse Rate: 74 Resp: 18 BP: 100/62 Patient Position (if appropriate): Lying Oxygen Therapy SpO2: 90 % O2 Device: Room Air Pain:   ADL:   Vision   Perception    Praxis   Exercises:   Other Treatments:     Therapy/Group: Individual Therapy  Tonny Branch 08/21/2020, 6:48 AM

## 2020-08-21 NOTE — Progress Notes (Signed)
PROGRESS NOTE   Subjective/Complaints: Asks that propanolol be decreased to BID or he gets vertigo He usually wears support stockings at home.  Working with SLP in medication management Threw up last night after eating a certain meat.   ROS:  +bilateral knee pain, +tremor  Objective:   No results found. Recent Labs    08/20/20 0530  WBC 8.5  HGB 12.5*  HCT 38.3*  PLT 218   Recent Labs    08/19/20 0322 08/20/20 0530  NA 136 137  K 3.6 3.5  CL 104 105  CO2 23 25  GLUCOSE 109* 98  BUN 16 15  CREATININE 0.97 0.94  CALCIUM 8.8* 8.9    Intake/Output Summary (Last 24 hours) at 08/21/2020 1019 Last data filed at 08/21/2020 0746 Gross per 24 hour  Intake 1137 ml  Output 750 ml  Net 387 ml        Physical Exam: Vital Signs Blood pressure 110/87, pulse 62, temperature 97.9 F (36.6 C), temperature source Oral, resp. rate 18, weight 118.5 kg, SpO2 90 %.  Gen: no distress, normal appearing, BMI 32.65 HEENT: oral mucosa pink and moist, NCAT Cardio: Reg rate Chest: normal effort, normal rate of breathing Abd: soft, non-distended Ext: no edema Psych: pleasant, normal affect Genitourinary:    Comments: Condom catheter in place- medium amber urine Musculoskeletal:     Cervical back: Normal range of motion. No rigidity.     Comments: LUE 5-/5- biceps, triceps, WE, grip and finger abd RUE 5/5 LLE- 4+/5 in HF, KE, DF and PF RLE_ 5/5 in same muscles Very TTP in R>L knee- esp medially ("needs knee replacements")  Skin:    General: Skin is warm and dry.     Comments: IV in R forearm -looks OK  Neurological:     Mental Status: He is alert and oriented to person, place, and time.     Comments: Tangential --question decreased hearing. Able to follow simple motor commands. Intentional tremors noted.   Hard to keep on track- L facial numbness/impaired sensation, but intact in all 4 extremities   Psychiatric:      Comments: Flat, appropriate, tangential       Gen: no distress, normal appearing HEENT: oral mucosa pink and moist, Comments: Slightly decreased L visual field vision  Cardiovascular:     Rate and Rhythm: Normal rate and regular rhythm.     Heart sounds: Normal heart sounds. No murmur heard. No gallop.   Pulmonary:     Comments: CTA B/L- no W/R/R- good air movement Abdominal:     Comments: Soft, NT, ND, (+)BS     Assessment/Plan: 1. Functional deficits which require 3+ hours per day of interdisciplinary therapy in a comprehensive inpatient rehab setting.  Physiatrist is providing close team supervision and 24 hour management of active medical problems listed below.  Physiatrist and rehab team continue to assess barriers to discharge/monitor patient progress toward functional and medical goals  Care Tool:  Bathing  Bathing activity did not occur: Refused           Bathing assist       Upper Body Dressing/Undressing Upper body dressing   What is the patient  wearing?: Hospital gown only    Upper body assist Assist Level: Supervision/Verbal cueing    Lower Body Dressing/Undressing Lower body dressing      What is the patient wearing?: Pants     Lower body assist Assist for lower body dressing: Minimal Assistance - Patient > 75%     Toileting Toileting    Toileting assist Assist for toileting: Minimal Assistance - Patient > 75% Assistive Device Comment: front wheel walker   Transfers Chair/bed transfer  Transfers assist  Chair/bed transfer activity did not occur: Safety/medical concerns  Chair/bed transfer assist level: Contact Guard/Touching assist     Locomotion Ambulation   Ambulation assist      Assist level: Contact Guard/Touching assist Assistive device: Walker-rolling Max distance: 252ft   Walk 10 feet activity   Assist     Assist level: Contact Guard/Touching assist Assistive device: Walker-rolling   Walk 50 feet  activity   Assist    Assist level: Contact Guard/Touching assist Assistive device: Walker-rolling    Walk 150 feet activity   Assist    Assist level: Contact Guard/Touching assist Assistive device: Walker-rolling    Walk 10 feet on uneven surface  activity   Assist Walk 10 feet on uneven surfaces activity did not occur: Safety/medical concerns         Wheelchair     Assist Will patient use wheelchair at discharge?: No   Wheelchair activity did not occur: N/A         Wheelchair 50 feet with 2 turns activity    Assist    Wheelchair 50 feet with 2 turns activity did not occur: N/A       Wheelchair 150 feet activity     Assist  Wheelchair 150 feet activity did not occur: N/A       Blood pressure 110/87, pulse 62, temperature 97.9 F (36.6 C), temperature source Oral, resp. rate 18, weight 118.5 kg, SpO2 90 %.    Medical Problem List and Plan: 1.  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  Antithrombotics: Continue Eliquis             -antiplatelet therapy: N/A 3. Headaches/Pain Management: Tylenol prn effective for HA             --resume Voltaren gel to knees.  4. Mood: LCSW to follow for evaluation and support.              -antipsychotic agents: N/A 5. Neuropsych: This patient maybe capable of making decisions on his own behalf. 6. Skin/Wound Care: Routine pressure relief measures. Discontinue IV. 7. Fluids/Electrolytes/Nutrition: Monitor I/O.  8. BPH: Continue FLomax daily.  9. H/o Tremors: Decrease inderal to BID as he took at home 10. Constipation: Continue schedule Senna S one every other day.  11. Transient hypokalemia: Recheck CMET in am.  12. H/o Peripheral edema:  Resume support stocking as needed, only used when walking a lot at  home.   13. Hypoalbuminemia: encourage high protein foods.    LOS: 2 days A FACE TO FACE EVALUATION WAS PERFORMED  [name] 08/21/2020, 10:19 AM

## 2020-08-22 NOTE — IPOC Note (Signed)
Overall Plan of Care [facility]) Patient Details Name: [name] MRN: [id number] DOB: January 06, 1941  Admitting Diagnosis: Left hemiparesis [facility])  Hospital Problems: Principal Problem:   Left hemiparesis (HCC) Active Problems:   Stroke (cerebrum) (HCC)     Functional Problem List: Nursing Medication Management,Safety,Endurance,Bladder  PT Balance,Endurance,Motor,Safety,Other (comment) (L visual field deficits)  OT Balance,Cognition,Edema,Endurance,Motor,Pain,Perception,Safety,Sensory,Vision  SLP Cognition  TR         Basic ADL's: OT Grooming,Bathing,Dressing,Toileting     Advanced  ADL's: OT       Transfers: PT Bed Mobility,Bed to Chair,Car  OT Toilet,Tub/Shower     Locomotion: PT Ambulation,Stairs     Additional Impairments: OT    SLP Social Cognition   Problem Solving,Memory,Awareness,Attention  TR      Anticipated Outcomes Item Anticipated Outcome  Self Feeding S  Swallowing      Basic self-care  S  Toileting  S   Bathroom Transfers S  Bowel/Bladder  Manage bladder without assistance  Transfers  mod I with LRAD  Locomotion  Supervision with LRAD  Communication     Cognition  Min-Supervision A  Pain  n/a  Safety/Judgment  Maintain safety wtih cues /reminders   Therapy Plan: PT Intensity: Minimum of 1-2 x/day ,45 to 90 minutes PT Frequency: 5 out of 7 days PT Duration Estimated Length of Stay: 10-12 days OT Intensity: Minimum of 1-2 x/day, 45 to 90 minutes OT Frequency: 5 out of 7 days OT Duration/Estimated Length of Stay: 10-12 SLP Intensity: Minumum of 1-2 x/day, 30 to 90 minutes SLP Frequency: 3 to 5 out of 7 days SLP Duration/Estimated Length of Stay: 10-12 days   Due to the current state of emergency, patients may not be receiving their 3-hours of Medicare-mandated therapy.   Team Interventions: Nursing Interventions Patient/Family Education,Discharge Planning,Medication Management,Disease Management/Prevention,Bladder Management  PT  interventions Ambulation/gait training,Discharge planning,Functional mobility training,Psychosocial support,Therapeutic Activities,Visual/perceptual remediation/compensation,Wheelchair propulsion/positioning,Therapeutic Exercise,Skin care/wound management,Neuromuscular re-education,Disease management/prevention,Balance/vestibular training,Cognitive remediation/compensation,DME/adaptive equipment instruction,Pain management,Splinting/orthotics,UE/LE Strength taining/ROM,UE/LE Coordination activities,Stair training,Patient/family education,Community reintegration  OT Interventions Balance/vestibular training,DME/adaptive equipment instruction,Patient/family education,Cognitive remediation/compensation,Therapeutic Activities,Therapeutic Exercise,Functional electrical stimulation,Psychosocial support,Community reintegration,Functional mobility training,Self Care/advanced ADL retraining,UE/LE Strength taining/ROM,UE/LE Coordination activities,Skin care/wound managment,Neuromuscular re-education,Discharge planning,Disease mangement/prevention,Pain management,Splinting/orthotics,Visual/perceptual remediation/compensation  SLP Interventions Cognitive remediation/compensation,Cueing hierarchy,Functional tasks,Environmental controls,Internal/external aids,Medication managment,Patient/family education  TR Interventions    SW/CM Interventions Discharge Planning,Psychosocial Support,Patient/Family Education   Barriers to Discharge MD  Medical stability  Nursing Decreased caregiver support,Medication compliance 2 level ramped home with chair lift to basement  PT Decreased caregiver support,Home environment access/layout,Insurance for SNF coverage,Lack of/limited family support    OT Decreased caregiver support,Lack of/limited family support    SLP      SW Decreased caregiver support Wife's dementia   Team Discharge Planning: Destination: PT-Home ,OT- Home , SLP-Home Projected Follow-up: PT-Home health  PT,Outpatient PT,Other (comment),24 hour supervision/assistance (Pending access to transportation), OT-  Home health OT, SLP-Home Health SLP,Outpatient SLP,24 hour supervision/assistance Projected Equipment Needs: PT-To be determined,None recommended by PT, OT- 3 in 1 bedside comode, SLP-None recommended by SLP Equipment Details: PT-Pt owns all needed DME, OT-  Patient/family involved in discharge planning: PT- Patient,  OT-Patient, SLP-Patient  MD ELOS: 5-7 days Medical Rehab Prognosis:  Excellent Assessment: [name] is a 80 year old man admitted to CIR with L hemiparesis with L visual field deficitsecondary to 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 Active medical issues include peripheral edema, tremors, hypokalemia, and constipation. Labs and vitals are being monitored and medications are being adjusted as needed.  See Team Conference Notes for weekly updates to the plan of care

## 2020-08-23 DIAGNOSIS — E876 Hypokalemia: Secondary | ICD-10-CM

## 2020-08-23 DIAGNOSIS — G250 Essential tremor: Secondary | ICD-10-CM

## 2020-08-23 LAB — BASIC METABOLIC PANEL
Anion gap: 8 (ref 5–15)
BUN: 16 mg/dL (ref 8–23)
CO2: 25 mmol/L (ref 22–32)
Calcium: 8.7 mg/dL — ABNORMAL LOW (ref 8.9–10.3)
Chloride: 107 mmol/L (ref 98–111)
Creatinine, Ser: 1.05 mg/dL (ref 0.61–1.24)
GFR, Estimated: >60 mL/min (ref >60)
Glucose, Bld: 104 mg/dL — ABNORMAL HIGH (ref 70–99)
Potassium: 3.6 mmol/L (ref 3.5–5.1)
Sodium: 140 mmol/L (ref 135–145)

## 2020-08-23 LAB — CBC
HCT: 36.9 % — ABNORMAL LOW (ref 39.0–52.0)
Hemoglobin: 12.1 g/dL — ABNORMAL LOW (ref 13.0–17.0)
MCH: 32 pg (ref 26.0–34.0)
MCHC: 32.8 g/dL (ref 30.0–36.0)
MCV: 97.6 fL (ref 80.0–100.0)
Platelets: 236 10*3/uL (ref 150–400)
RBC: 3.78 MIL/uL — ABNORMAL LOW (ref 4.22–5.81)
RDW: 12.9 % (ref 11.5–15.5)
WBC: 8.7 10*3/uL (ref 4.0–10.5)
nRBC: 0 % (ref 0.0–0.2)

## 2020-08-23 NOTE — Progress Notes (Signed)
Physical Therapy Session Note  Patient Details  Name: [name] MRN: [id number] Date of Birth: 20-Jul-1940  Today's Date: 08/23/2020 PT Individual Time: 1000-1056 PT Individual Time Calculation (min): 56 min   Short Term Goals: Week 1:  PT Short Term Goal 1 (Week 1): STG = LTG due to ELOS  Skilled Therapeutic Interventions/Progress Updates:   Received pt sitting in recliner, pt agreeable to therapy, and denied any pain but requested lidocaine on R knee as preventative pain measure; RN notified and present to adminster. Pt is hyperverbose and requires cues for redirection and increased time with all functional mobility. Session with emphasis on functional mobility/transfers, generalized strengthening, dynamic standing balance/coordination, gait training, and improved activity tolerance. Donned pants and shoes sitting in recliner with supervision. Sit<>stand with RW and CGA to pull pants over hips. Pt ambulated 176ft x 1 and 19ft x 1 with RW and CGA from 5C to 28M ortho gym with increased time and multiple standing rest breaks due to fatigue. Pt demonstrated L foot drag and flexed trunk with heavy reliance on UE support on RW. Pt required cues to increase step length and for upright posture but demonstrated poor carry over. Provided pt with 22x18 manual WC for energy conservation when traveling longer distances and due to fatigue from walking from [facility], although pt insisting that he does not need one. Pt performed seated BLE strengthening on Kinetron at 20 cm/sec for 1 minute x 4 trials with emphasis on quad/glute strengthening and core activation. Pt transported back to room in [facility] total A. Concluded session with pt sitting in WC, needs within reach, and chair pad alarm on awaiting OT session.   Therapy Documentation Precautions:  Precautions Precautions: Fall Precaution Comments: L visual field deficits. Verbose and tangential at times Restrictions Weight Bearing Restrictions: No  Therapy/Group:  Individual Therapy Martin Majestic PT, DPT   08/23/2020, 7:15 AM

## 2020-08-23 NOTE — Progress Notes (Signed)
Occupational Therapy Session Note  Patient Details  Name: [name] MRN: [id number] Date of Birth: 12-23-1940  Today's Date: 08/23/2020 OT Individual Time: 1303-1403 OT Individual Time Calculation (min): 60 min    Short Term Goals: Week 1:  OT Short Term Goal 1 (Week 1): Pt will transfer to toilet with CGA OT Short Term Goal 2 (Week 1): Pt will don pants wiht CGA OT Short Term Goal 3 (Week 1): Pt will don B socks with AE PRN OT Short Term Goal 4 (Week 1): Pt will groom in standing with CGA  Skilled Therapeutic Interventions/Progress Updates:    Pt received in room and consented to OT tx. Pt seen for functional transfer training, toileting, dynamic balance training, and functional mobility with item retrieval to increase ADL and IADL independence. Pt instructed to walk with RW to collect items in gym, cuing for safety with RW and cuing to turn all the way around and have both legs touching w/c before reaching back to sit. Pt is very talkative and requires min cuing to attend to task at hand. Instructed in card matching activity while standing in RW with close SUP. Cards placed on L side of pt for him to pick up with L hand and scan environment to place correctly. Pt able to complete with increased time, min cuing as pt matched similar items, rather than the same items a couple of times. Min tactile cuing for proper upright posture. Pt then helped to toilet in room with CGA, then washed hands standing sink side with close SUP. After tx, pt left up in w/c with all needs met and chair alarm on.   Therapy Documentation Precautions:  Precautions Precautions: Fall Precaution Comments: L visual field deficits. Verbose and tangential at times Restrictions Weight Bearing Restrictions: No   Pain: Pain Assessment Pain Scale: 0-10 Pain Score: 0-No pain ADL: ADL Toileting: Contact guard Where Assessed-Toileting: Geneticist, molecular guard (with RW)   Therapy/Group: Individual  Therapy  [name] 08/23/2020, 2:17 PM

## 2020-08-23 NOTE — Progress Notes (Signed)
Speech Language Pathology Daily Session Note  Patient Details  Name: [name] MRN: [id number] Date of Birth: [birth year]  Today's Date: 08/23/2020 SLP Individual Time: 1415-1500 SLP Individual Time Calculation (min): 45 min  Short Term Goals: Week 1: SLP Short Term Goal 1 (Week 1): Pt will demonstrate sustained attention to task as well on topic for 10 minutes with supervision A verbal cues. SLP Short Term Goal 2 (Week 1): Pt will demonstrate mildly complex problem solving skills in functional tasks with min A verbal cues. SLP Short Term Goal 3 (Week 1): Pt will self-monitor and self-correct functional errors mod I. SLP Short Term Goal 4 (Week 1): Pt will scan left of midline during functional tasks with supervision A verbal cues. SLP Short Term Goal 5 (Week 1): Pt will recall daily, novel events with supervision A verbal cues for written aids.  Skilled Therapeutic Interventions: Skilled SLP intervention focused on cognition. Pt completed medication management with TID pill box with max Afor redirection and reasoning with purpose of using pill box vs taking from each individual bottle daily. Pt responded to questions regarding medicationtimes and where to place them in pill box with mod A verbal cues. Mildly complex mony calculation worksheet completed with  min A vebal cues for redirection to task . Cont with therapy per plan of care,   Pain Pain Assessment Pain Scale: Faces Pain Score: 0-No pain Faces Pain Scale: No hurt  Therapy/Group: Individual Therapy  [name] 08/23/2020, 3:02 PM

## 2020-08-23 NOTE — Progress Notes (Signed)
Occupational Therapy Session Note  Patient Details  Name: [name] MRN: [id number] Date of Birth: 1940/07/21  Today's Date: 08/23/2020 OT Individual Time: 1093-2355 OT Individual Time Calculation (min): 55 min    Short Term Goals: Week 1:  OT Short Term Goal 1 (Week 1): Pt will transfer to toilet with CGA OT Short Term Goal 2 (Week 1): Pt will don pants wiht CGA OT Short Term Goal 3 (Week 1): Pt will don B socks with AE PRN OT Short Term Goal 4 (Week 1): Pt will groom in standing with CGA  Skilled Therapeutic Interventions/Progress Updates:    Treatment session with focus on functional use of LUE, dynamic standing balance, and sustained attention to task.  Pt hyperverbose throughout session, requiring cues and redirection to sustain attention to tasks at hand.  Pt declined bathing and dressing this session, but agreeable to therapy session. Engaged in pipe tree puzzle in standing with CGA for standing balance and sit > stand. Pt required mod cues for redirection, sequencing, and problem solving while completing pipe tree puzzle in standing.  Pt requiring seated rest breaks throughout session due to pain in RLE.  Pt with onset of bilateral tremors L > R, reports these are not new.  Pt requested to terminate session a few mins early due to phone call.  Pt remained upright in w/c with chair alarm on and all needs in reach.  Therapy Documentation Precautions:  Precautions Precautions: Fall Precaution Comments: L visual field deficits. Verbose and tangential at times Restrictions Weight Bearing Restrictions: No Pain:   Pt with c/o pain in R knee with standing, required seated rest breaks.   Therapy/Group: Individual Therapy  Rosalio Loud 08/23/2020, 12:27 PM

## 2020-08-23 NOTE — Progress Notes (Signed)
PROGRESS NOTE   Subjective/Complaints: Very concerned about wife at home without him. Said that he typically does everything for her and that friends are watching now. Anxious to get home  ROS: Patient denies fever, rash, sore throat, blurred vision, nausea, vomiting, diarrhea, cough, shortness of breath or chest pain, joint or back pain, headache, or mood change.   Objective:   No results found. Recent Labs    08/23/20 0612  WBC 8.7  HGB 12.1*  HCT 36.9*  PLT 236   Recent Labs    08/23/20 0612  NA 140  K 3.6  CL 107  CO2 25  GLUCOSE 104*  BUN 16  CREATININE 1.05  CALCIUM 8.7*    Intake/Output Summary (Last 24 hours) at 08/23/2020 8546 Last data filed at 08/23/2020 0616 Gross per 24 hour  Intake 240 ml  Output 600 ml  Net -360 ml        Physical Exam: Vital Signs Blood pressure 106/69, pulse 76, temperature 98.2 F (36.8 C), temperature source Oral, resp. rate 14, weight 118.5 kg, SpO2 90 %.  Constitutional: No distress . Vital signs reviewed. HEENT: EOMI, oral membranes moist Neck: supple Cardiovascular: RRR without murmur. No JVD    Respiratory/Chest: CTA Bilaterally without wheezes or rales. Normal effort    GI/Abdomen: BS +, non-tender, non-distended Ext: no clubbing, cyanosis, or edema Psych: pleasant and cooperative Musculoskeletal:     Cervical back: Normal range of motion. No rigidity.     Comments: LUE 5-/5- biceps, triceps, WE, grip and finger abd RUE 5/5 LLE- 4+/5 in HF, KE, DF and PF RLE_ 5/5 in same muscles R>L knee pain with palpation Skin:    General: Skin is warm and dry.    Neurological:     Mental Status: He is alert and oriented to person, place, and time.     Comments: tangential. Hard to keep on track- L facial numbness/impaired sensation, but intact in all 4 extremities             Assessment/Plan: 1. Functional deficits which require 3+ hours per day of  interdisciplinary therapy in a comprehensive inpatient rehab setting.  Physiatrist is providing close team supervision and 24 hour management of active medical problems listed below.  Physiatrist and rehab team continue to assess barriers to discharge/monitor patient progress toward functional and medical goals  Care Tool:  Bathing  Bathing activity did not occur: Refused Body parts bathed by patient: Right arm,Left arm,Chest,Abdomen,Front perineal area,Buttocks,Right upper leg,Left upper leg,Right lower leg,Left lower leg,Face         Bathing assist Assist Level: Minimal Assistance - Patient > 75%     Upper Body Dressing/Undressing Upper body dressing   What is the patient wearing?: Hospital gown only    Upper body assist Assist Level: Supervision/Verbal cueing    Lower Body Dressing/Undressing Lower body dressing      What is the patient wearing?: Pants     Lower body assist Assist for lower body dressing: Minimal Assistance - Patient > 75%     Toileting Toileting    Toileting assist Assist for toileting: Minimal Assistance - Patient > 75% Assistive Device Comment: front wheel walker  Transfers Chair/bed transfer  Transfers assist  Chair/bed transfer activity did not occur: Safety/medical concerns  Chair/bed transfer assist level: Contact Guard/Touching assist     Locomotion Ambulation   Ambulation assist      Assist level: Contact Guard/Touching assist Assistive device: Walker-rolling Max distance: 150   Walk 10 feet activity   Assist     Assist level: Contact Guard/Touching assist Assistive device: Walker-rolling   Walk 50 feet activity   Assist    Assist level: Contact Guard/Touching assist Assistive device: Walker-rolling    Walk 150 feet activity   Assist    Assist level: Contact Guard/Touching assist Assistive device: Walker-rolling    Walk 10 feet on uneven surface  activity   Assist Walk 10 feet on uneven surfaces  activity did not occur: Safety/medical concerns         Wheelchair     Assist Will patient use wheelchair at discharge?: No   Wheelchair activity did not occur: N/A         Wheelchair 50 feet with 2 turns activity    Assist    Wheelchair 50 feet with 2 turns activity did not occur: N/A       Wheelchair 150 feet activity     Assist  Wheelchair 150 feet activity did not occur: N/A       Blood pressure 106/69, pulse 76, temperature 98.2 F (36.8 C), temperature source Oral, resp. rate 14, weight 118.5 kg, SpO2 90 %.    Medical Problem List and Plan: 1.  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  Antithrombotics: Continue Eliquis             -antiplatelet therapy: N/A 3. Headaches/Pain Management: Tylenol prn effective for HA             --resumed Voltaren gel to knees. Knee pain seems improved 4/2 4. Mood: LCSW to follow for evaluation and support.              -antipsychotic agents: N/A 5. Neuropsych: This patient maybe capable of making decisions on his own behalf. 6. Skin/Wound Care: Routine pressure relief measures. Discontinue IV. 7. Fluids/Electrolytes/Nutrition:    I personally reviewed the patient's labs today.  wnl   8. BPH: Continue FLomax daily.  9. H/o Tremors: Decreased inderal to BID as he took at home 10. Constipation: Continue schedule Senna S one every other day.  11. Transient hypokalemia: 3.6 5/2  12. H/o Peripheral edema:  Resume support stocking as needed, only used when walking a lot at home.   13. Hypoalbuminemia: encouraging high protein foods.    LOS: 4 days A FACE TO FACE EVALUATION WAS PERFORMED  Ranelle Oyster 08/23/2020, 9:09 AM

## 2020-08-23 NOTE — Progress Notes (Signed)
Pt slept fairly well, awakening in brief intervals throughout the night. . No c/o pain or discomfort.

## 2020-08-24 MED ORDER — LIDOCAINE 5 % EX PTCH
2.0000 | MEDICATED_PATCH | CUTANEOUS | Status: DC
  Administered 2020-08-24 – 2020-08-26 (×3): 2 via TRANSDERMAL
  Filled 2020-08-24 (×3): qty 2

## 2020-08-24 NOTE — Progress Notes (Signed)
Physical Therapy Discharge Summary  Patient Details  Name: [name] MRN: [id number] Date of Birth: 1940-05-03  Patient has met 4 of 8 long term goals due to improved activity tolerance, improved balance, improved postural control and improved coordination. Patient to discharge at an ambulatory level Supervision. Patient's care partner unavailable to provide the necessary physical and cognitive assistance at discharge. Pt's family did not attend family education training. It is recommended that pt have supervision at D/C due to decreased insights into deficits and decreased awareness and problem solving thus impacting the safety of pt's and pt's ability to care for his wife.   Reasons goals not met: Pt did not meet bed mobility goal of independent as pt requires increased time with bed mobility. Pt did not meet mod I transfer goals as pt continues to require supervision for RW safety and awareness. Pt did not meet stair goal of CGA as pt requires min A due to decreased balance/postural control, generalized weakness, and decreased endurance.   Recommendation:  Patient will benefit from ongoing skilled PT services in outpatient setting to continue to advance safe functional mobility, address ongoing impairments in transfers, generalized strengthening, dynamic standing balance/coordination, gait training, endurance, and to minimize fall risk.  Equipment: No equipment provided; pt already has all equipment  Reasons for discharge: treatment goals met  Patient/family agrees with progress made and goals achieved: Yes  PT Discharge Precautions/Restrictions Precautions Precautions: Fall Precaution Comments: L visual field deficits. Verbose and tangential at times Restrictions Weight Bearing Restrictions: No Cognition Overall Cognitive Status: Impaired/Different from baseline Arousal/Alertness: Awake/alert Orientation Level: Oriented X4 Memory: Impaired Awareness: Impaired Problem Solving:  Impaired Safety/Judgment: Impaired Comments: decreased insight into deficits Sensation Sensation Light Touch: Appears Intact Proprioception: Appears Intact Coordination Gross Motor Movements are Fluid and Coordinated: No Fine Motor Movements are Fluid and Coordinated: No Coordination and Movement Description: mild uncoordination due to mild L hemiparesis, generalized weakness, and decreased endurance. Finger Nose Finger Test: decreased accurracy due to tremors Heel Shin Test: [facility] bilaterally Motor  Motor Motor: Hemiplegia;Abnormal postural alignment and control Motor - Skilled Clinical Observations: mild uncoordination due to mild L hemiparesis, generalized weakness, and decreased endurance.  Mobility Bed Mobility Bed Mobility: Rolling Right;Rolling Left;Sit to Supine;Supine to Sit Rolling Right: Independent with assistive device Rolling Left: Independent with assistive device Supine to Sit: Independent with assistive device Sit to Supine: Independent with assistive device Transfers Transfers: Sit to Stand;Stand Pivot Transfers;Stand to Sit Sit to Stand: Supervision/Verbal cueing Stand to Sit: Supervision/Verbal cueing Stand Pivot Transfers: Supervision/Verbal cueing Stand Pivot Transfer Details: Verbal cues for safe use of DME/AE;Verbal cues for precautions/safety;Verbal cues for technique Stand Pivot Transfer Details (indicate cue type and reason): verbal cues for RW safety and awareness Transfer (Assistive device): Rolling walker Locomotion  Gait Ambulation: Yes Gait Assistance: Supervision/Verbal cueing Gait Distance (Feet): 150 Feet Assistive device: Rolling walker Gait Assistance Details: Verbal cues for safe use of DME/AE;Verbal cues for gait pattern;Verbal cues for technique;Verbal cues for precautions/safety Gait Assistance Details: verbal cues to increase bilateral foot clearance and for RW safety Gait Gait: Yes Gait Pattern: Impaired Gait Pattern: Step-through  pattern;Decreased step length - right;Decreased step length - left;Decreased stride length;Trunk flexed;Wide base of support;Poor foot clearance - right;Poor foot clearance - left;Decreased dorsiflexion - right;Decreased dorsiflexion - left;Decreased trunk rotation Gait velocity: decreased Stairs / Additional Locomotion Stairs: Yes Stairs Assistance: Minimal Assistance - Patient > 75% Stair Management Technique: Two rails Number of Stairs: 8 Height of Stairs: 6 Ramp: Supervision/Verbal cueing (RW) Wheelchair  Mobility Wheelchair Mobility: Yes Wheelchair Assistance: Chartered loss adjuster: Both upper extremities Wheelchair Parts Management: Supervision/cueing Distance: 1104ft  Trunk/Postural Assessment  Cervical Assessment Cervical Assessment: Within Functional Limits Thoracic Assessment Thoracic Assessment: Within Functional Limits Lumbar Assessment Lumbar Assessment: Exceptions to [facility] (posterior pelvic tilt in sitting) Postural Control Postural Control: Within Functional Limits  Balance Balance Balance Assessed: Yes Static Sitting Balance Static Sitting - Balance Support: Feet supported;Bilateral upper extremity supported Static Sitting - Level of Assistance: 7: Independent Dynamic Sitting Balance Dynamic Sitting - Balance Support: Feet supported;No upper extremity supported Dynamic Sitting - Level of Assistance: 6: Modified independent (Device/Increase time) Static Standing Balance Static Standing - Balance Support: Bilateral upper extremity supported (RW) Static Standing - Level of Assistance: 5: Stand by assistance (supervision) Dynamic Standing Balance Dynamic Standing - Balance Support: Bilateral upper extremity supported (RW) Dynamic Standing - Level of Assistance: 5: Stand by assistance (supervision) Extremity Assessment  RLE Assessment RLE Assessment: Within Functional Limits General Strength Comments: Grossly 4+/5 LLE Assessment LLE  Assessment: Exceptions to [facility] General Strength Comments: grossly generalized to 4/5  Alfonse Alpers PT, DPT  08/24/2020, 3:26 PM

## 2020-08-24 NOTE — Progress Notes (Signed)
Speech Language Pathology Daily Session Note  Patient Details  Name: KAYLON HITZ MRN: [id number] Date of Birth: Dec 01, 1940  Today's Date: 08/24/2020 SLP Individual Time: 1430-1515 SLP Individual Time Calculation (min): 45 min  Short Term Goals: Week 1: SLP Short Term Goal 1 (Week 1): Pt will demonstrate sustained attention to task as well on topic for 10 minutes with supervision A verbal cues. SLP Short Term Goal 2 (Week 1): Pt will demonstrate mildly complex problem solving skills in functional tasks with min A verbal cues. SLP Short Term Goal 3 (Week 1): Pt will self-monitor and self-correct functional errors mod I. SLP Short Term Goal 4 (Week 1): Pt will scan left of midline during functional tasks with supervision A verbal cues. SLP Short Term Goal 5 (Week 1): Pt will recall daily, novel events with supervision A verbal cues for written aids.  Skilled Therapeutic Interventions: Pt seen for skilled ST with focus on cognitive goals. Pt with upcoming d/c, focus of tx on attempting to increase awareness of current deficits, safety awareness, and problem solving for home environment. When SLP entered room, pt finishing phone call with friend who will pick him up from the hospital Thursday AM. Pt encouraged to utilize resources at home to assist with daily living tasks, care of wife and transportation. SLP stating it is not safe for pt to drive until cleared by MD, pt not agreeable to this. Pt states he can see fine despite L visual field cut, SLP educating patient on complex nature of operating a car, more than just visual. Pt initially stating he plans to use walker until he can graduate to cane "in 6-12 months", then stating he plans to drive to church Monday and surprise them utilizing a cane. Pt demonstrates poor awareness of current physical and cognitive impairments, minimally agreeable to education this date. Continue to recommend 24/7 supervision at this time, assist with higher level  cognitive tasks (med management, money management, etc). Pt left in chair with alarm set and all needs within reach. Cont ST POC.   Pain Pain Assessment Pain Scale: 0-10 Pain Score: 0-No pain  Therapy/Group: Individual Therapy  Tacey Ruiz 08/24/2020, 3:21 PM

## 2020-08-24 NOTE — Progress Notes (Addendum)
Patient ID: [name], male   DOB: [birth year], 80 y.o.   MRN: [id number]  Met with pt to inform of team conference goals of supervision level and target discharge date of 5/5. Made aware of Speech concerns regarding medication management and fiances. He hopes to work on this the next two days and if has too will ask a friend to check it when at home. He wants to go back to OPPT at [facility] will fax an order for this. Aware he will not be driving at discharge until follows up with MD. Work on discharge Thursday  2:45 PM Informed pt he received a rolling walker in 2020 and can not get another one covered by insurance. Pt said to not order he will use the one he has at home.

## 2020-08-24 NOTE — Progress Notes (Signed)
PROGRESS NOTE   Subjective/Complaints: Patient's chart reviewed- No issues reported overnight Vitals signs stable Therapy notes reviewed- ambulating >257feet CG with RW. Patient is very eager to return home as wife has dementia and he helps her at home  ROS: Patient denies fever, rash, sore throat, blurred vision, nausea, vomiting, diarrhea, cough, shortness of breath or chest pain, joint or back pain, headache, or mood change.   Objective:   No results found. Recent Labs    08/23/20 0612  WBC 8.7  HGB 12.1*  HCT 36.9*  PLT 236   Recent Labs    08/23/20 0612  NA 140  K 3.6  CL 107  CO2 25  GLUCOSE 104*  BUN 16  CREATININE 1.05  CALCIUM 8.7*    Intake/Output Summary (Last 24 hours) at 08/24/2020 0558 Last data filed at 08/24/2020 0500 Gross per 24 hour  Intake 720 ml  Output 925 ml  Net -205 ml        Physical Exam: Vital Signs Blood pressure 99/73, pulse 73, temperature 98 F (36.7 C), temperature source Oral, resp. rate 19, weight 118.5 kg, SpO2 93 %.  Gen: no distress, normal appearing HEENT: oral mucosa pink and moist, NCAT Cardio: Reg rate Chest: normal effort, normal rate of breathing Abd: soft, non-distended Ext: no edema Psych: pleasant, normal affect Musculoskeletal:     Cervical back: Normal range of motion. No rigidity.     Comments: LUE 5-/5- biceps, triceps, WE, grip and finger abd RUE 5/5 LLE- 4+/5 in HF, KE, DF and PF RLE_ 5/5 in same muscles R>L knee pain with palpation Skin:    General: Skin is warm and dry.    Neurological:     Mental Status: He is alert and oriented to person, place, and time.     Comments: tangential. Hard to keep on track- L facial numbness/impaired sensation, but intact in all 4 extremities             Assessment/Plan: 1. Functional deficits which require 3+ hours per day of interdisciplinary therapy in a comprehensive inpatient rehab  setting.  Physiatrist is providing close team supervision and 24 hour management of active medical problems listed below.  Physiatrist and rehab team continue to assess barriers to discharge/monitor patient progress toward functional and medical goals  Care Tool:  Bathing  Bathing activity did not occur: Refused Body parts bathed by patient: Right arm,Left arm,Chest,Abdomen,Front perineal area,Buttocks,Right upper leg,Left upper leg,Right lower leg,Left lower leg,Face         Bathing assist Assist Level: Minimal Assistance - Patient > 75%     Upper Body Dressing/Undressing Upper body dressing   What is the patient wearing?: Hospital gown only    Upper body assist Assist Level: Supervision/Verbal cueing    Lower Body Dressing/Undressing Lower body dressing      What is the patient wearing?: Pants     Lower body assist Assist for lower body dressing: Minimal Assistance - Patient > 75%     Toileting Toileting    Toileting assist Assist for toileting: Minimal Assistance - Patient > 75% Assistive Device Comment: front wheel walker   Transfers Chair/bed transfer  Transfers assist  Chair/bed transfer  activity did not occur: Safety/medical concerns  Chair/bed transfer assist level: Contact Guard/Touching assist     Locomotion Ambulation   Ambulation assist      Assist level: Contact Guard/Touching assist Assistive device: Walker-rolling Max distance: 150   Walk 10 feet activity   Assist     Assist level: Contact Guard/Touching assist Assistive device: Walker-rolling   Walk 50 feet activity   Assist    Assist level: Contact Guard/Touching assist Assistive device: Walker-rolling    Walk 150 feet activity   Assist    Assist level: Contact Guard/Touching assist Assistive device: Walker-rolling    Walk 10 feet on uneven surface  activity   Assist Walk 10 feet on uneven surfaces activity did not occur: Safety/medical concerns          Wheelchair     Assist Will patient use wheelchair at discharge?: No   Wheelchair activity did not occur: N/A         Wheelchair 50 feet with 2 turns activity    Assist    Wheelchair 50 feet with 2 turns activity did not occur: N/A       Wheelchair 150 feet activity     Assist  Wheelchair 150 feet activity did not occur: N/A       Blood pressure 99/73, pulse 73, temperature 98 F (36.7 C), temperature source Oral, resp. rate 19, weight 118.5 kg, SpO2 93 %.    Medical Problem List and Plan: 1.  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  Antithrombotics: Continue Eliquis             -antiplatelet therapy: N/A 3. Headaches/Pain Management: Tylenol prn effective for HA             --resumed Voltaren gel to knees. Knee pain seems improved 4/2 4. Mood: LCSW to follow for evaluation and support.              -antipsychotic agents: N/A 5. Neuropsych: This patient maybe capable of making decisions on his own behalf. 6. Skin/Wound Care: Routine pressure relief measures. Discontinue IV. 7. Fluids/Electrolytes/Nutrition:    I personally reviewed the patient's labs today.  Wnl. Continue vitamin C tablet.  8. BPH: Continue FLomax daily.  9. H/o Tremors: Decreased inderal to BID as he took at home 10. Constipation: Continue schedule Senna S one every other day.  11. Transient hypokalemia: 3.6 5/2  12. H/o Peripheral edema:  Resume support stocking as needed, only used when walking a lot at home.   13. Hypoalbuminemia: encouraging high protein foods.   14. GERD: intermittent. Advised avoiding eating large meals, changing protonix to PRN. 15. Obesity: BMI 32.65: provide dietary education 16. Disposition: To be determined at conference today.   LOS: 5 days A FACE TO  FACE EVALUATION WAS PERFORMED  Drema Pry Teigan Manner 08/24/2020, 5:58 AM

## 2020-08-24 NOTE — Progress Notes (Signed)
Physical Therapy Session Note  Patient Details  Name: [name] MRN: [id number] Date of Birth: 01-27-1941  Today's Date: 08/24/2020 PT Individual Time: [phone] and 1300-1355  PT Individual Time Calculation (min): 26 min and 55 min  Short Term Goals: Week 1:  PT Short Term Goal 1 (Week 1): STG = LTG due to ELOS  Skilled Therapeutic Interventions/Progress Updates:   Treatment Session 1: [id number] 26 min Received pt ambulating from bathroom with NT, PT took over with care. Pt agreeable to therapy and denied any pain during session. RN present to administer medications. Session with emphasis on functional mobility/transfers, generalized strengthening, dynamic standing balance/coordination, gait training, and improved activity tolerance. Pt remains hyper-verbose and required significantly increased time with all mobility and cues for redirection. Doffed hospital socks and donned shoes with set up assist. Pt transferred sit<>stand with RW and close supervision and ambulated 122ft with RW and supervision. Pt demonstrated flexed trunk, downward gaze, heavy reliance on UE use, and decreased bilateral foot clearance LLE>RLE requiring cues for correct. Extensive discussion had regarding DME. Pt reports having multiple walkers and rollator's but does not have RW; will notify CSW. Educated pt on safety risks of using rollator ultimately recommending pt use RW upon D/C. Pt agreeable for the most part but states he wants to continue using the rollator in his sunroom and to sit on. Concluded session with pt sitting in recliner, needs within reach, and chair pad alarm on.   Treatment Session 2: 1300-1355 55 min Received pt sitting in WC, pt agreeable to therapy, and denied any pain during session. Session with emphasis on functional mobility/transfers, generalized strengthening, dynamic standing balance/coordination, simulated car transfers, stair navigation, and improved activity tolerance. Pt reported urge to  use restroom and ambulated 26ft x 2 trials with RW and close supervision. Pt able to manage clothing with close supervision and void. Pt required total A for posterior peri-care in standing and applied ointment with total A per pt request. Pt stood at sink and washed hands with supervision. Pt transported to 67M ortho gym in [facility] total A for time management purposes and performed simulated car transfer with RW and supervision. Pt required cues for safe entry and management of RW as pt very particular about his routine for getting in/out of the car. Pt ambulated 78ft on uneven surfaces (ramp) with RW and close supervision with cues to increase step height and for RW safety. Pt then navigated 8 steps with 2 rails and min A alternating ascending with a step to and step through pattern and descending with a step to pattern. Pt able to don legrests with supervision and increased time (as pt reports having WC at home) and transported back to room in [facility] total A. NT present to assess vitals. Concluded session with pt sitting in WC, needs within reach, and chair pad alarm on.   Therapy Documentation Precautions:  Precautions Precautions: Fall Precaution Comments: L visual field deficits. Verbose and tangential at times Restrictions Weight Bearing Restrictions: No  Therapy/Group: Individual Therapy Martin Majestic PT, DPT   08/24/2020, 7:21 AM

## 2020-08-24 NOTE — Patient Care Conference (Signed)
Inpatient RehabilitationTeam Conference and Plan of Care Update Date: 08/24/2020   Time: 0943 AM    Patient Name: [name]      Medical Record Number: [id number]  Date of Birth: [birth year] Sex: Male         Room/Bed: 5C08C/5C08C-01 Payor Info: Payor: [employer]vertising copy[employer] MEDICARE / Plan: [facility] MEDICARE / Product Type: *No Product type* /    Admit Date/Time:  08/19/2020  5:06 PM  Primary Diagnosis:  Left hemiparesis [facility])  Hospital Problems: Principal Problem:   Left hemiparesis (HCC) Active Problems:   Stroke (cerebrum) [facility])    Expected Discharge Date: Expected Discharge Date: 08/26/20  Team Members Present: Physician leading conference: Dr. Sula Soda Care Coodinator Present: Chana Bode, RN, BSN, CRRN;Becky Dupree, LCSW Nurse Present: Chana Bode, RN PT Present: Raechel Chute, PT OT Present: Rosalio Loud, OT SLP Present: Colin Benton, SLP PPS Coordinator present : Fae Pippin, SLP     Current Status/Progress Goal Weekly Team Focus  Bowel/Bladder             Swallow/Nutrition/ Hydration             ADL's   Min assist bathing and dressing at shower level, CGA sit > stand and ambulatory transfers.  Pt hyperverbose and limited by R knee pain/instability  Supervision overall  ADL retraining, dynamic standing balance, endurance, L NMR, safety awareness   Mobility   bed mobility min A, transfers with RW CGA, gait 218ft with RW CGA, and WC mobility 122ft supervision  Mod I, supervision gait, CGA steps  functional mobility/transfers, generalized strengthening, dynamic standing balance/coordination, ambulation, and improved activity tolerance.   Communication             Safety/Cognition/ Behavioral Observations  mod A needs frequent redirection to tasks  Min-supervision A  Reasoning and prob solving with med management.   Pain             Skin               Discharge Planning:  Home with wife who has dementia and can only be there not assist at all. Pt was  going to OPPT prior to admission and will resume this at discharge from here. Pt anxious to go home soon to be with wife   Team Discussion: Medically stable; ready for discharge, labs WNL, VSS and medications adjusted back to home dosages. Staff concerned about ability to care for self and supervise wife with health issues. Functional status limited by left visual issues, and cognitive impairments. Patient with limited awareness of deficits and requires mod assist currently for cognition and medication management.  Patient on target to meet rehab goals: Currently min assist - CGA for OP and CGA for toileting and dynamic standing. Requires cues for tasks and staying on task. Hyper verbouse and skirts around issues or is "set in his way" of doing activities that appear unsafe.   *See Care Plan and progress notes for long and short-term goals.   Revisions to Treatment Plan:   Teaching Needs: Safety, medication management, cues for safety and tasks.   Current Barriers to Discharge: Decreased caregiver support, Home enviroment access/layout and Behavior  Possible Resolutions to Barriers: Recommend supervision by family or friends for medications and money management. Recommend min assist - supervision for patient and help with care of his wife Recommend standard walker for discharge     Medical Summary Current Status: vitals stable, labs stable, MASD to buttocks, GERD, bilateral knee pain, tremor  Barriers  to Discharge: Medical stability;Wound care  Barriers to Discharge Comments: MASD to buttocks, GERD, bilateral knee pain, tremor Possible Resolutions to [employer] Focus: apply barrier cream, decrease protonix to daily PRN, decrease inderal to BID   Continued Need for Acute Rehabilitation Level of Care: The patient requires daily medical management by a physician with specialized training in physical medicine and rehabilitation for the following reasons: Direction of a  multidisciplinary physical rehabilitation program to maximize functional independence : Yes Medical management of patient stability for increased activity during participation in an intensive rehabilitation regime.: Yes Analysis of laboratory values and/or radiology reports with any subsequent need for medication adjustment and/or medical intervention. : Yes   I attest that I was present, lead the team conference, and concur with the assessment and plan of the team.   Chana Bode B 08/24/2020, 9:49 AM

## 2020-08-24 NOTE — Consult Note (Signed)
Neuropsychological Consultation   Patient:   [name]   DOB:   Feb 09, 1941  MR Number:  [id number]  [facility]:  [facility] [address] CENTER [id number] [place] [address] Dept: [phone] Loc: [phone]           Date of Service:   08/24/2020  Start Time:   3 PM End Time:   4 PM  Provider/Observer:  Arley Phenix, Psy.D.       Clinical Neuropsychologist       Billing Code/Service: [id number]  Chief Complaint:    [name] is a 80 year old male who has a past medical history including previous CVA, DVT/B-PE in 2020, tremors, hematuria, gait disorder.  Patient was admitted on 08/14/2020 with acute onset of left-sided visual deficits and left upper extremity/left lower extremity numbness and weakness.  CT head was negative and patient did receive tPA.  CTA of the head/neck negative for large vessel occlusion.  MRI was not done due to retained steel pellets.  The patient has had past CT studies and had a previous occipital lobe stroke that was identified in earlier CT scans.  Most recent CT scans did not show any specific acute intracranial abnormality but his multiple CT scans have shown advanced generalized atrophy and indications of moderate to significant chronic microvascular ischemic changes and significant small vessel disease.  His stroke was felt to be due to small vessel disease.  Patient stopped taking Eliquis due to questionable SE?  And fell off the medication list?.  This medication was resumed after discussion with PCP.  Patient continues to be limited by left-sided sensory deficits and left visual field deficits and inattention.  Patient started work at [employer] after therapy evaluations were completed due to functional decline.  Reason for Service:  Patient was referred for neuropsychological consultation due to coping and adjustment issues following stroke.  The patient does appear to have some degree  of visual disturbance and inattention but is insisting on quickly returning back to his life including driving.  Below is the HPI for the current admission.  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 Mena Goes) who was admitted on 08/14/20 with acute onset of left sided visual deficits and LUE/LLE numbness/weakness. CT head was negative w/ NIHSS 2  and he received tPA. CTA head/neck negative for LVO.  MRI brain not done due to retained steel pellets. 2D echo showed Ef 55-60% with mild concentric LVH. Stroke felt to be due to small vessel disease. Patient had stopped taking eliquis due to questionable SE? Fell off medication list?--> was resumed after discussion with PCP. He continues to be limited by left sided sensory deficits and Left visual field deficits. CIR recommended due to functional decline.   Current Status:  When I approached the room the patient was in the doorway in his wheelchair engaged in active conversation with one of the nurses on the other side of the hall.  The patient was very talkative and gregarious and voicing pleasure about his anticipated discharge in 2 days.  He has had no previous background information but is unclear how his personality style was prior.  The patient immediately started talking about his wife's visual deficits and dementia and his responsibilities for caring for her and his desire to immediately return to driving and other activities to support her.  The patient  acknowledged vision changes but attributed solely to his glasses and that he would be seeing his eye doctor to look at a new prescription.  While I did not objectively assess his vision it does appear that he has some degree of visual neglect another neurological vision deficits beyond just the fact that his glasses are outdated.  The patient insisted that he needed to be able to drive for himself to be able to get to any  outpatient therapies and to help take care of his wife.  However, advised the patient not drive until he gets clearance to drive and he would likely need to see a neuro-ophthalmologist to assess residual visual processing deficits.  The patient denied any significant depression or anxiety.  He continued to be quite engaging and somewhat tangential in his communication but I suspect this is likely his historical personality style rather than a specific change.  Behavioral Observation: [name]  presents as a 80 y.o.-year-old Right Caucasian Male who appeared his stated age. his dress was Appropriate and he was Well Groomed and his manners were Appropriate to the situation.  his participation was indicative of Appropriate and Redirectable behaviors.  There were physical disabilities noted.  he displayed an appropriate level of cooperation and motivation.     Interactions:    Active Appropriate and Redirectable  Attention:   abnormal and attention span appeared shorter than expected for age  Memory:   within normal limits; recent and remote memory intact  Visuo-spatial:  abnormal with suspected some degree of visual inattention and visual neglect that the patient himself is attributed to primary vision changes.  Speech (Volume):  normal  Speech:   normal; normal  Thought Process:  Tangential  Though Content:  WNL; not suicidal and not homicidal  Orientation:   person, place, time/date and situation  Judgment:   Fair  Planning:   Poor  Affect:    Appropriate  Mood:    Euthymic  Insight:   Fair  Intelligence:   normal  Medical History:   Past Medical History:  Diagnosis Date  . Arrhythmia   . Arthritis   . Hypertension   . Stroke [facility])    no deficits  . Thyroid nodule   . Tremors of nervous system   . Vertigo          Patient Active Problem List   Diagnosis Date Noted  . MRI contraindicated due to metal implant 08/19/2020  . Long term current use of anticoagulant  therapy 08/19/2020  . Left hemiparesis (HCC) 08/19/2020  . Stroke (cerebrum) (HCC) 08/15/2020  . CVA (cerebral vascular accident) (HCC) 08/14/2020  . Knee pain, right   . Pulmonary embolus (HCC)   . Transaminitis   . Acute blood loss anemia   . Debility 10/16/2018  . Benign prostatic hyperplasia with urinary retention   . Resting tremor   . Slow transit constipation   . Generalized edema   . 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 Hyponatremia 10/11/2018  . Hematuria 10/11/2018  . Hypertension   . Tremors of nervous system   . Vertigo   . Thyroid nodule   . Arrhythmia   . TIA (transient ischemic attack) 03/15/2018  . Onychomycosis 12/05/2016  . Skin ulcer of second toe of right foot, limited to breakdown of skin (HCC) 12/05/2016    Psychiatric History:  No prior psychiatric history  Family Med/Psych History:  Family History  Problem Relation Age of Onset  . Hypertension Mother   .  Hypertension Father     Impression/DX:  [name] is a 80 year old male who has a past medical history including previous CVA, DVT/B-PE in 2020, tremors, hematuria, gait disorder.  Patient was admitted on 08/14/2020 with acute onset of left-sided visual deficits and left upper extremity/left lower extremity numbness and weakness.  CT head was negative and patient did receive tPA.  CTA of the head/neck negative for large vessel occlusion.  MRI was not done due to retained steel pellets.  The patient has had past CT studies and had a previous occipital lobe stroke that was identified in earlier CT scans.  Most recent CT scans did not show any specific acute intracranial abnormality but his multiple CT scans have shown advanced generalized atrophy and indications of moderate to significant chronic microvascular ischemic changes and significant small vessel disease.  His stroke was felt to be due to small vessel disease.  Patient stopped taking Eliquis due to questionable SE?  And fell off  the medication list?.  This medication was resumed after discussion with PCP.  Patient continues to be limited by left-sided sensory deficits and left visual field deficits and inattention.  Patient started work at [employer] after therapy evaluations were completed due to functional decline.  When I approached the room the patient was in the doorway in his wheelchair engaged in active conversation with one of the nurses on the other side of the hall.  The patient was very talkative and gregarious and voicing pleasure about his anticipated discharge in 2 days.  He has had no previous background information but is unclear how his personality style was prior.  The patient immediately started talking about his wife's visual deficits and dementia and his responsibilities for caring for her and his desire to immediately return to driving and other activities to support her.  The patient acknowledged vision changes but attributed solely to his glasses and that he would be seeing his eye doctor to look at a new prescription.  While I did not objectively assess his vision it does appear that he has some degree of visual neglect another neurological vision deficits beyond just the fact that his glasses are outdated.  The patient insisted that he needed to be able to drive for himself to be able to get to any outpatient therapies and to help take care of his wife.  However, advised the patient not drive until he gets clearance to drive and he would likely need to see a neuro-ophthalmologist to assess residual visual processing deficits.  The patient denied any significant depression or anxiety.  He continued to be quite engaging and somewhat tangential in his communication but I suspect this is likely his historical personality style rather than a specific change.  Disposition/Plan:  I do suspect that the patient has some developing cognitive difficulties associated with cerebrovascular changes including significant generalized  atrophy and significant microvascular ischemic changes that are chronic and progressing.          Electronically Signed   _______________________ Arley Phenix, Psy.D. Clinical Neuropsychologist

## 2020-08-24 NOTE — Progress Notes (Signed)
Occupational Therapy Session Note  Patient Details  Name: [name] MRN: [id number] Date of Birth: 1940/11/09  Today's Date: 08/24/2020 OT Individual Time: 1020-1113 OT Individual Time Calculation (min): 53 min    Short Term Goals: Week 1:  OT Short Term Goal 1 (Week 1): Pt will transfer to toilet with CGA OT Short Term Goal 2 (Week 1): Pt will don pants wiht CGA OT Short Term Goal 3 (Week 1): Pt will don B socks with AE PRN OT Short Term Goal 4 (Week 1): Pt will groom in standing with CGA  Skilled Therapeutic Interventions/Progress Updates:    Treatment session with focus on safety awareness and increased independence with self-care tasks of bathing and dressing.  Due to upcoming d/c and pt quite verbose, therapist encouraged pt to complete self-care tasks this session to assess safety and independence while focusing on decreased assistance and reliance on therapist.  Pt ambulated to room shower with RW with supervision, noted shuffling gait.  Pt completed shower transfer with close supervision when stepping over shower ledge.  Pt reports "my shower is not like this" despite setup to simulate home setup.  Pt completed bathing with distant supervision, therapist present but not providing cues.  Pt giving step by step description of sequencing during shower.  Pt requiring increased time to locate items in L visual field during self-care tasks.  Pt dried off post shower and completed dressing from shower seat with supervision.  Pt demonstrating good safety awareness during self-care tasks.    Of note, SLP does still have safety awareness concerns in regards to finances and medication management.  This therapist has not addressed safety with meal prep tasks to this point.  Therapy Documentation Precautions:  Precautions Precautions: Fall Precaution Comments: L visual field deficits. Verbose and tangential at times Restrictions Weight Bearing Restrictions: No Pain: Pain Assessment Pain  Scale: 0-10 Pain Score: 0-No pain   Therapy/Group: Individual Therapy  Rosalio Loud 08/24/2020, 10:51 AM

## 2020-08-24 NOTE — Progress Notes (Signed)
Inpatient Rehabilitation Care Coordinator Discharge Note  The overall goal for the admission was met for:   Discharge location: Yes-HOME WITH WIFE WHO HAS DEMENTIA-SHE CAN BE THERE  Length of Stay: Yes-7 DAYS  Discharge activity level: Yes-SUPERVISION LEVEL AT [place]  Home/community participation: Yes  Services provided included: MD, RD, PT, OT, SLP, RN, CM, Pharmacy, Neuropsych and SW  Financial Services: Private Insurance: [facility]  Choices offered to/list presented to:PT  Follow-up services arranged: Outpatient: [facility] WILL CONTACT PT TO SET UP APPOINTMENT, DME: [facility]- TALL ROLL[facility]ALKER and Patient/Family has no preference for HH/DME agencies PT RECEIVED A BARIATRIC ROLLING WALKERI N 2020 NOT ELIGIBLE FOR ANOTHER ONE UNTIL 2025. PT TOLD WORKER TO CANCEL ORDER  Comments (or additional information):PT WANTED TO GET HOME TO WIFE WHO HAS DEMENTIA AND HE IS THE CAREGIVER FOR. CONCERNS REGARDING MEDICATION MANAGEMENT AND FIANCES-PT AWARE OF THIS. FRIENDS TO CHECK ON BOTH. PT GETTING A EYE APPOINTMENT SO CAN BE CLEARED TO DRIVE ASAP. HE HAS A FRIEND WHO WILL DRIVE HIM 2 X WEEKLY EYE APPOINTMENT GOTTEN FOR 5/16 WHICH PT FEELS TOO LONG AWAY  Patient/Family verbalized understanding of follow-up arrangements: Yes  Individual responsible for coordination of the follow-up plan: SELF [phone]  Confirmed correct DME delivered: Elease Hashimoto 08/24/2020    Elease Hashimoto

## 2020-08-25 NOTE — Progress Notes (Addendum)
Patient ID: [name], male   DOB: 1940-05-13, 80 y.o.   MRN: [id number] Pt told by neuro-psych he can not drive when he is discharged, this causes pt great concern due to he has no one to drive them to the grocery store, his therapies and church. Informed him it is up to the MD and he will need to talk with her today when rounding. He will do this this am  2:00 PM Attempting to assist pt in setting up eye appointment with Dr Earlene Plater his eye MD. Pt is wanting to be cleared to drive and currently only has someone to provide 2 x per week. Have left a message for MD office to call back. Pt to also try too.

## 2020-08-25 NOTE — Progress Notes (Shared)
Speech Language Pathology Discharge Summary  Patient Details  Name: [name] MRN: [id number] Date of Birth: June 12, 1940  Today's Date: 08/25/2020 SLP Individual Time: 9030-0923 SLP Individual Time Calculation (min): 45 min   Skilled Therapeutic Interventions:  Skilled SLP intervention focused on discharge education. Pt able to provide names of 2 people that can help him minimally once home but stated they will not be available consistently. Pt stated he is reaching out to his preacher to see if he has any options for assistance once home.    Patient has met 1 of 4 long term goals.  Patient to discharge at [facility] level.  Reasons goals not met:     Clinical Impression/Discharge Summary:   Pt has made slow gains and has met 1 out of 4 LTG's this reporting period. He continues to demonstrate poor awareness of deficits including left visual field cut. He has been instructed to not drive until cleared by MD.  He continues to require min-mod A with higher level cognitive tasks and needs consistent redirection due to decreased attention. He will require 24 hour supervision once home. Pt will benefit from follow up SLP services to maximize his functional independence and safety.  Care Partner:  Caregiver Able to Provide Assistance: Yes  Type of Caregiver Assistance: Physical;Cognitive  Recommendation:  Home Health SLP;Outpatient SLP;24 hour supervision/assistance  Rationale for SLP Follow Up: Maximize functional communication;Maximize cognitive function and independence;Reduce caregiver burden   Equipment: none recommended at this time.   Reasons for discharge: Discharged from hospital   Patient/Family Agrees with Progress Made and Goals Achieved: Yes    [name] 08/25/2020, 12:24 PM

## 2020-08-25 NOTE — Progress Notes (Signed)
Physical Therapy Session Note  Patient Details  Name: [name] MRN: [id number] Date of Birth: 1940/10/13  Today's Date: 08/25/2020 PT Individual Time: 1130-1155 and 1445-1526 PT Individual Time Calculation (min): 25 min  and 41 min Today's Date: 08/25/2020 PT Missed Time: 15 Minutes Missed Time Reason: Other (Comment) (phone call)  Short Term Goals: Week 1:  PT Short Term Goal 1 (Week 1): STG = LTG due to ELOS  Skilled Therapeutic Interventions/Progress Updates:   Treatment Session 1 Received pt sitting in [facility] speaking with wife on phone. Pt requested therapist return a little later so that he can make a phone call to set up an eye doctor appointment for tomorrow so he can get back to driving ASAP. Therapist returned 15 minutes later and pt agreeable to PT treatment, and denied any pain during session. Session with emphasis on discharge planning, functional mobility/transfers, bed mobility, generalized strengthening, dynamic standing balance/coordination, and improved activity tolerance. Sit<>stand with RW and supervision and transferred sit<>supine, rolled L/R, and transferred supine<>sitting EOB from flat bed without using bedrails mod I with increased time. Worked on standing from low surfaces with cues for hand placement on bed and RW. Received another phone call from wife and transferred bed<>WC with RW and supervision. Assisted pt with dialing to contact eye doctor. Concluded session with pt sitting in WC, needs within reach, and chair pad alarm on. 15 minutes missed of skilled physical therapy.   Treatment Session 2 Received pt sitting in WC, pt agreeable to PT treatment, and denied any pain during session. Session with emphasis on functional mobility/transfers, generalized strengthening, dynamic standing balance/coordination, gait training, and improved activity tolerance. Pt transported ot 4W dayroom in WC total A for time management purposes. Pt transferred sit<>stand with RW and  supervision and ambulated 143ft with RW and close supervision. Pt demonstrates flexed trunk, wide BOS, decreased bilateral foot clearance LLE>RLE, and heavy reliance on UE support with min cues to increase step height/length. Worked on dynamic standing balance with RW playing tic tac toe x 5 trials with emphasis on problem solving, reaching outside BOS, and attention/awareness. Pt required min cues initially but towards 5th game did not require any. Pt transported back to room in [facility] total A and requested to remain in WC. Concluded session with pt sitting in WC, needs within reach, and chair pad alarm on.   Therapy Documentation Precautions:  Precautions Precautions: Fall Precaution Comments: L visual field deficits. Verbose and tangential at times Restrictions Weight Bearing Restrictions: No  Therapy/Group: Individual Therapy Martin Majestic PT, DPT   08/25/2020, 7:44 AM

## 2020-08-25 NOTE — Progress Notes (Signed)
PROGRESS NOTE   Subjective/Complaints: Discussed that [name] is not clear to drive until he has clearance from his PCP. Advised follow-up with his eye doctor and PCP within first week of his discharge. Left messages for wife and son this morning to establish follow-up care  ROS: Patient denies fever, rash, sore throat, blurred vision, nausea, vomiting, diarrhea, cough, shortness of breath or chest pain, joint or back pain, headache, or mood change.   Objective:   No results found. Recent Labs    08/23/20 0612  WBC 8.7  HGB 12.1*  HCT 36.9*  PLT 236   Recent Labs    08/23/20 0612  NA 140  K 3.6  CL 107  CO2 25  GLUCOSE 104*  BUN 16  CREATININE 1.05  CALCIUM 8.7*    Intake/Output Summary (Last 24 hours) at 08/25/2020 0914 Last data filed at 08/25/2020 0650 Gross per 24 hour  Intake 472 ml  Output 1200 ml  Net -728 ml        Physical Exam: Vital Signs Blood pressure 113/75, pulse 70, temperature 97.9 F (36.6 C), temperature source Oral, resp. rate 20, weight 118.5 kg, SpO2 92 %.  Gen: no distress, normal appearing HEENT: oral mucosa pink and moist, NCAT Cardio: Reg rate Chest: normal effort, normal rate of breathing Abd: soft, non-distended Ext: no edema Psych: pleasant, normal affect Skin: intact Musculoskeletal:     Cervical back: Normal range of motion. No rigidity.     Comments: LUE 5-/5- biceps, triceps, WE, grip and finger abd RUE 5/5 LLE- 4+/5 in HF, KE, DF and PF RLE_ 5/5 in same muscles R>L knee pain with palpation Skin:    General: Skin is warm and dry.    Neurological:     Mental Status: He is alert and oriented to person, place, and time.     Comments: tangential. Hard to keep on track- L facial numbness/impaired sensation, but intact in all 4 extremities             Assessment/Plan: 1. Functional deficits which require 3+ hours per day of interdisciplinary therapy in a  comprehensive inpatient rehab setting.  Physiatrist is providing close team supervision and 24 hour management of active medical problems listed below.  Physiatrist and rehab team continue to assess barriers to discharge/monitor patient progress toward functional and medical goals  Care Tool:  Bathing  Bathing activity did not occur: Refused Body parts bathed by patient: Right arm,Left arm,Chest,Abdomen,Front perineal area,Buttocks,Right upper leg,Left upper leg,Right lower leg,Left lower leg,Face         Bathing assist Assist Level: Supervision/Verbal cueing     Upper Body Dressing/Undressing Upper body dressing   What is the patient wearing?: Pull over shirt    Upper body assist Assist Level: Set up assist    Lower Body Dressing/Undressing Lower body dressing      What is the patient wearing?: Pants     Lower body assist Assist for lower body dressing: Set up assist     Toileting Toileting    Toileting assist Assist for toileting: Supervision/Verbal cueing Assistive Device Comment: front wheel walker   Transfers Chair/bed transfer  Transfers assist  Chair/bed transfer activity  did not occur: Safety/medical concerns  Chair/bed transfer assist level: Supervision/Verbal cueing     Locomotion Ambulation   Ambulation assist      Assist level: Supervision/Verbal cueing Assistive device: Walker-rolling Max distance: 150   Walk 10 feet activity   Assist     Assist level: Supervision/Verbal cueing Assistive device: Walker-rolling   Walk 50 feet activity   Assist    Assist level: Supervision/Verbal cueing Assistive device: Walker-rolling    Walk 150 feet activity   Assist    Assist level: Supervision/Verbal cueing Assistive device: Walker-rolling    Walk 10 feet on uneven surface  activity   Assist Walk 10 feet on uneven surfaces activity did not occur: Safety/medical concerns   Assist level: Supervision/Verbal cueing Assistive  device: Photographer Will patient use wheelchair at discharge?: No   Wheelchair activity did not occur: N/A         Wheelchair 50 feet with 2 turns activity    Assist    Wheelchair 50 feet with 2 turns activity did not occur: N/A       Wheelchair 150 feet activity     Assist  Wheelchair 150 feet activity did not occur: N/A       Blood pressure 113/75, pulse 70, temperature 97.9 F (36.6 C), temperature source Oral, resp. rate 20, weight 118.5 kg, SpO2 92 %.    Medical Problem List and Plan: 1.  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  Antithrombotics: Continue Eliquis             -antiplatelet therapy: N/A 3. Headaches/Pain Management: Tylenol prn effective for HA             --resumed Voltaren gel to knees. Knee pain seems improved 4/2. Start bilateral lidocaine patches.  4. Mood: LCSW to follow for evaluation and support.              -antipsychotic agents: N/A 5. Neuropsych: This patient maybe capable of making decisions on his own behalf. 6. Skin/Wound Care: Routine pressure relief measures. Discontinue IV. 7. Fluids/Electrolytes/Nutrition:    I personally reviewed the patient's labs today.  Wnl. Continue vitamin C tablet.  8. BPH: Continue FLomax daily.  9. H/o Tremors: Decreased inderal to BID as he took at home, continue this dose 10. Constipation: Continue schedule Senna S one every other day.  11. Transient hypokalemia: 3.6 5/2  12. H/o Peripheral edema:  Resume support stocking as needed, only used when walking a lot at home.   13. Hypoalbuminemia: encouraging high protein foods.   14. GERD: intermittent. Advised avoiding eating large meals, changing protonix to PRN. 15. Obesity: BMI 32.65: provide dietary  education 16. Disposition: d/c Thursday. Discussed that [name] is not clear to drive until he has clearance from his PCP. Advised follow-up with his eye doctor and PCP within first week of his discharge. Left messages for wife and son this morning to establish follow-up care  LOS: 6 days A FACE TO FACE EVALUATION WAS PERFORMED  [name] 08/25/2020, 9:14 AM

## 2020-08-25 NOTE — Progress Notes (Signed)
Occupational Therapy Session Note  Patient Details  Name: [name] MRN: [id number] Date of Birth: [birth year]  Today's Date: 08/25/2020 OT Individual Time: 1950-9326 OT Individual Time Calculation (min): 75 min    Short Term Goals: Week 1:  OT Short Term Goal 1 (Week 1): Pt will transfer to toilet with CGA OT Short Term Goal 2 (Week 1): Pt will don pants wiht CGA OT Short Term Goal 3 (Week 1): Pt will don B socks with AE PRN OT Short Term Goal 4 (Week 1): Pt will groom in standing with CGA  Skilled Therapeutic Interventions/Progress Updates:    Treatment session with focus on d/c planning with self-care retraining and focus on safety with L inattention.  Pt received upright in w/c having just finished breakfast.  Pt reports upset over circumstances from yesterday evening.  Upon further investigation, pt reports irate about a "head doctor" that told him that he was not allowed to drive up d/c which upset him.   Therapist reiterated that all of his therapy team has concerns about his L inattention and safety both in the home and with driving.  Pt reports that he has to drive to his OPPT, appts, to get food.  He reports that he does not have anyone to help him with these things.  This therapist notified MD of pt and therapist concerns.  Pt donned pants with distant supervision at sit > stand level.  Pt agreeable to engage in BITS to further address and educate on L inattention to carryover to driving.  Completed Bell Cancellation assessment with pt requiring 4:45 to complete, missing 7 of 54 dispersed throughout all quadrants.  Completed Singe Target Visual Scanning with pt correctly locating 77% in the 5 second time limit.  Pt missed 5 in upper L quadrant, 2 in lower L quadrant, and 0 in either R quadrants.  Pt required 2.5 seconds to locate items in L visual field compared to 1.65 in R visual field.  Therapist educated pt on results and carryover to driving due to decreased attention to stimulus in  L visual field.  MD arrived at this time, spent time discussing typical d/c recommendations in regards to driving as well as recommendations and concerns specific to this pt.  Pt still stating that he does not have anyone that can drive and he will have to cancel all appts. During discussion pt able to come up with a few friends who are currently assisting with wife and may be able to assist.  MD to further discuss with pt, wife, and friends.  Pt completed Box and Blocks assessment, completing 47 on R and 35 on L in 60 seconds.  Pt returned to room and NT arriving to provide toileting assist.   Therapy Documentation Precautions:  Precautions Precautions: Fall Precaution Comments: L visual field deficits. Verbose and tangential at times Restrictions Weight Bearing Restrictions: No General:   Vital Signs: Therapy Vitals Temp: 97.9 F (36.6 C) Temp Source: Oral Pulse Rate: 67 Resp: 20 BP: 110/69 Patient Position (if appropriate): Lying Oxygen Therapy SpO2: 92 % O2 Device: Room Air Pain:   ADL: ADL Toileting: Contact guard Where Assessed-Toileting: Public affairs consultant guard (with RW) Vision   Perception    Praxis   Exercises:   Other Treatments:     Therapy/Group: Individual Therapy  Rosalio Loud 08/25/2020, 7:24 AM

## 2020-08-25 NOTE — Progress Notes (Signed)
Occupational Therapy Discharge Summary  Patient Details  Name: [name] MRN: [id number] Date of Birth: 10-18-1940   Patient has met 11 of 13 long term goals due to improved activity tolerance, improved balance, postural control, ability to compensate for deficits and improved awareness.  Patient to discharge at overall Supervision level.  Patient's care partner has dementia and will be present in home with husband but unsure level of dementia and physical ability and how much assistance/cues she can truly provide at discharge.  Therapist recommends supervision upon discharge and have discussed having friends and family members to provide intermittent assistance, especially with IADLs and driving due to L inattention.  Pt initially stating that he does not have anyone, but in conversation is able to identify people who have and are assisting.  Reasons goals not met: Pt did not meet simple meal prep or laundry goals due to short length of stay and not a focus at this time due to focus placed on safety with BADLs, L attention, and safety awareness.  Therapist has recommended family/friends assist with meals and pt reports that he and his wife complete these tasks together typically.  Recommendation:  Patient will benefit from ongoing skilled OT services in outpatient setting to continue to advance functional skills in the area of BADL and Reduce care partner burden.  Equipment: No equipment provided  Reasons for discharge: treatment goals met and discharge from hospital  Patient/family agrees with progress made and goals achieved: Yes  OT Discharge Precautions/Restrictions  Precautions Precautions: Fall Precaution Comments: L visual field deficits. Verbose and tangential at times Vital Signs Therapy Vitals Temp: 97.9 F (36.6 C) Temp Source: Oral Pulse Rate: 66 Resp: 18 BP: 112/73 Patient Position (if appropriate): Sitting Oxygen Therapy SpO2: 94 % O2 Device: Room  Air Pain Pain Assessment Pain Scale: Faces Faces Pain Scale: No hurt ADL ADL Eating: Independent Where Assessed-Eating: Chair Grooming: Supervision/safety Where Assessed-Grooming: Standing at sink Upper Body Bathing: Supervision/safety,Setup Where Assessed-Upper Body Bathing: Chair Lower Body Bathing: Supervision/safety,Setup Where Assessed-Lower Body Bathing: Chair Upper Body Dressing: Setup Where Assessed-Upper Body Dressing: Chair Lower Body Dressing: Supervision/safety,Setup Where Assessed-Lower Body Dressing: Chair Toileting: Supervision/safety Where Assessed-Toileting: Glass blower/designer: Close supervision Toilet Transfer Method: Magazine features editor: Close supervision Social research officer, government Method: Management consultant: Grab bars,Shower seat with back Vision Baseline Vision/History: Wears glasses Wears Glasses: Reading only Patient Visual Report: Peripheral vision impairment Vision Assessment?: Vision impaired- to be further tested in functional context;Yes Eye Alignment: Within Functional Limits Ocular Range of Motion: Within Functional Limits Alignment/Gaze Preference: Within Defined Limits Tracking/Visual Pursuits: Decreased smoothness of horizontal tracking;Decreased smoothness of vertical tracking Saccades: Additional eye shifts occurred during testing;Additional head turns occurred during testing Visual Fields: Left visual field deficit Additional Comments: Completed BITS with pt requiring nearly 1 second more to locate stimulus in L field compared to R and missing 7 on L and none on R Cognition Overall Cognitive Status: Impaired/Different from baseline Arousal/Alertness: Awake/alert Orientation Level: Oriented X4 Attention: Sustained Sustained Attention: Impaired Sustained Attention Impairment: Verbal complex;Functional complex Memory: Impaired Memory Impairment: Decreased recall of new information;Retrieval  deficit Awareness: Impaired Awareness Impairment: Emergent impairment;Intellectual impairment Problem Solving: Impaired Problem Solving Impairment: Functional complex;Verbal complex Safety/Judgment: Impaired Comments: decreased insight into deficits Sensation Sensation Light Touch: Appears Intact Proprioception: Appears Intact Coordination Gross Motor Movements are Fluid and Coordinated: No Fine Motor Movements are Fluid and Coordinated: No Coordination and Movement Description: mild uncoordination due to mild L hemiparesis, generalized weakness, and decreased  endurance. Finger Nose Finger Test: decreased accurracy due to tremors Heel Shin Test: [facility] bilaterally Motor  Motor Motor: Hemiplegia;Abnormal postural alignment and control Motor - Skilled Clinical Observations: mild uncoordination due to mild L hemiparesis, generalized weakness, and decreased endurance. Mobility  Bed Mobility Bed Mobility: Rolling Right;Rolling Left;Sit to Supine;Supine to Sit Rolling Right: Independent with assistive device Rolling Left: Independent with assistive device Supine to Sit: Independent with assistive device Sit to Supine: Independent with assistive device Transfers Sit to Stand: Supervision/Verbal cueing Stand to Sit: Supervision/Verbal cueing  Trunk/Postural Assessment  Cervical Assessment Cervical Assessment: Within Functional Limits Thoracic Assessment Thoracic Assessment: Within Functional Limits Lumbar Assessment Lumbar Assessment: Exceptions to [facility] (posterior pelvic tilt in sitting) Postural Control Postural Control: Within Functional Limits  Balance Balance Balance Assessed: Yes Static Sitting Balance Static Sitting - Balance Support: Feet supported;Bilateral upper extremity supported Static Sitting - Level of Assistance: 7: Independent Dynamic Sitting Balance Dynamic Sitting - Balance Support: Feet supported;No upper extremity supported Dynamic Sitting - Level of  Assistance: 6: Modified independent (Device/Increase time) Static Standing Balance Static Standing - Balance Support: Bilateral upper extremity supported (RW) Static Standing - Level of Assistance: 5: Stand by assistance (supervision) Dynamic Standing Balance Dynamic Standing - Balance Support: Bilateral upper extremity supported (RW) Dynamic Standing - Level of Assistance: 5: Stand by assistance (supervision) Extremity/Trunk Assessment RUE Assessment RUE Assessment: Within Functional Limits General Strength Comments: mild intention tremor LUE Assessment LUE Assessment: Within Functional Limits General Strength Comments: mild intention tremor   Aviana Shevlin 08/25/2020, 3:54 PM

## 2020-08-26 MED ORDER — DICLOFENAC SODIUM 1 % EX GEL: 2.0000 g | Freq: Four times a day (QID) | CUTANEOUS | 0 refills | Status: DC

## 2020-08-26 MED ORDER — LIDOCAINE 5 % EX PTCH: 2.0000 | MEDICATED_PATCH | CUTANEOUS | 0 refills | Status: AC

## 2020-08-26 MED ORDER — SENNOSIDES-DOCUSATE SODIUM 8.6-50 MG PO TABS: 1.0000 | ORAL_TABLET | ORAL | Status: DC

## 2020-08-26 MED ORDER — PROPRANOLOL HCL 10 MG PO TABS: 10.0000 mg | ORAL_TABLET | Freq: Two times a day (BID) | ORAL | Status: AC

## 2020-08-26 MED ORDER — LIDOCAINE 4 % EX CREA: TOPICAL_CREAM | Freq: Three times a day (TID) | CUTANEOUS | 0 refills | Status: AC | PRN

## 2020-08-26 MED ORDER — ATORVASTATIN CALCIUM 20 MG PO TABS: 20.0000 mg | ORAL_TABLET | Freq: Every day | ORAL | 0 refills | Status: DC

## 2020-08-26 MED ORDER — APIXABAN 5 MG PO TABS: 5.0000 mg | ORAL_TABLET | Freq: Two times a day (BID) | ORAL | 0 refills | Status: DC

## 2020-08-26 NOTE — Progress Notes (Signed)
PROGRESS NOTE   Subjective/Complaints: Mr. Ashland is on the phone with his eye doctor- an appointment has been made on 5/16 and he has asked for an earlier appointment if available. He has explained his situation very clearly to his doctor- does have some difficulty hearing.  ROS: Patient denies fever, rash, sore throat, blurred vision, nausea, vomiting, diarrhea, cough, shortness of breath or chest pain, joint or back pain, headache, or mood change.   Objective:   No results found. No results for input(s): WBC, HGB, HCT, PLT in the last 72 hours. No results for input(s): NA, K, CL, CO2, GLUCOSE, BUN, CREATININE, CALCIUM in the last 72 hours.  Intake/Output Summary (Last 24 hours) at 08/26/2020 0825 Last data filed at 08/26/2020 0700 Gross per 24 hour  Intake 720 ml  Output 600 ml  Net 120 ml        Physical Exam: Vital Signs Blood pressure 100/75, pulse 67, temperature 97.7 F (36.5 C), temperature source Oral, resp. rate 14, weight 118.5 kg, SpO2 94 %.  Gen: no distress, normal appearing HEENT: oral mucosa pink and moist, NCAT Cardio: Reg rate Chest: normal effort, normal rate of breathing Abd: soft, non-distended Ext: no edema Psych: pleasant, normal affect Musculoskeletal:     Cervical back: Normal range of motion. No rigidity.     Comments: LUE 5-/5- biceps, triceps, WE, grip and finger abd RUE 5/5 LLE- 4+/5 in HF, KE, DF and PF RLE_ 5/5 in same muscles R>L knee pain with palpation Skin:    General: Skin is warm and dry.    Neurological:     Mental Status: He is alert and oriented to person, place, and time.     Comments: tangential. Hard to keep on track- L facial numbness/impaired sensation, but intact in all 4 extremities             Assessment/Plan: 1. Functional deficits which require 3+ hours per day of interdisciplinary therapy in a comprehensive inpatient rehab setting.  Physiatrist is  providing close team supervision and 24 hour management of active medical problems listed below.  Physiatrist and rehab team continue to assess barriers to discharge/monitor patient progress toward functional and medical goals  Care Tool:  Bathing  Bathing activity did not occur: Refused Body parts bathed by patient: Right arm,Left arm,Chest,Abdomen,Front perineal area,Buttocks,Right upper leg,Left upper leg,Right lower leg,Left lower leg,Face         Bathing assist Assist Level: Supervision/Verbal cueing     Upper Body Dressing/Undressing Upper body dressing   What is the patient wearing?: Pull over shirt    Upper body assist Assist Level: Set up assist    Lower Body Dressing/Undressing Lower body dressing      What is the patient wearing?: Pants     Lower body assist Assist for lower body dressing: Set up assist     Toileting Toileting    Toileting assist Assist for toileting: Supervision/Verbal cueing Assistive Device Comment: front wheel walker   Transfers Chair/bed transfer  Transfers assist  Chair/bed transfer activity did not occur: Safety/medical concerns  Chair/bed transfer assist level: Supervision/Verbal cueing     Locomotion Ambulation   Ambulation assist  Assist level: Supervision/Verbal cueing Assistive device: Walker-rolling Max distance: 150   Walk 10 feet activity   Assist     Assist level: Supervision/Verbal cueing Assistive device: Walker-rolling   Walk 50 feet activity   Assist    Assist level: Supervision/Verbal cueing Assistive device: Walker-rolling    Walk 150 feet activity   Assist    Assist level: Supervision/Verbal cueing Assistive device: Walker-rolling    Walk 10 feet on uneven surface  activity   Assist Walk 10 feet on uneven surfaces activity did not occur: Safety/medical concerns   Assist level: Supervision/Verbal cueing Assistive device: Photographer Will  patient use wheelchair at discharge?: No   Wheelchair activity did not occur: N/A         Wheelchair 50 feet with 2 turns activity    Assist    Wheelchair 50 feet with 2 turns activity did not occur: N/A       Wheelchair 150 feet activity     Assist  Wheelchair 150 feet activity did not occur: N/A       Blood pressure 100/75, pulse 67, temperature 97.7 F (36.5 C), temperature source Oral, resp. rate 14, weight 118.5 kg, SpO2 94 %.    Medical Problem List and Plan: 1.  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  Antithrombotics: Continue Eliquis             -antiplatelet therapy: N/A 3. Headaches/Pain Management: Tylenol prn effective for HA             --continue Voltaren gel to knees. Knee pain seems improved 4/2. Start bilateral lidocaine patches.  4. Mood: LCSW to follow for evaluation and support.              -antipsychotic agents: N/A 5. Neuropsych: This patient maybe capable of making decisions on his own behalf. 6. Skin/Wound Care: Routine pressure relief measures. Discontinue IV. 7. Fluids/Electrolytes/Nutrition:    I personally reviewed the patient's labs today.  Wnl. Continue vitamin C tablet.  8. BPH: Continue FLomax daily.  9. H/o Tremors: Decreased inderal to BID as he took at home, continue this dose 10. Constipation: continue schedule Senna S one every other day.  11. Transient hypokalemia: 3.6 5/2  12. H/o Peripheral edema:  Resume support stocking as needed, only used when walking a lot at home.   13. Hypoalbuminemia: encouraging high protein foods.   14. GERD: intermittent. Advised avoiding eating large meals, changing protonix to PRN. 15. Obesity: BMI 32.65: provide dietary education 16. Disposition: d/c today. Discussed that Mr. Shadowens is not  clear to drive until he has clearance from his PCP. Advised follow-up with his eye doctor and PCP within first week of his discharge. Left messages for wife and son this morning to establish follow-up care. Mr. Dullea is on the phone with his eye doctor- an appointment has been made on 5/16 and he has asked for an earlier appointment if available. He has explained his situation very clearly to his doctor- does have some difficulty hearing.   >30 minutes spent in discharge of patient including review of medications and follow-up appointments, physical examination, and in answering  all patient's questions  LOS: 7 days A FACE TO FACE EVALUATION WAS PERFORMED  Ayda Tancredi P Deontez Klinke 08/26/2020, 8:25 AM
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
## 2020-08-26 NOTE — Discharge Instructions (Signed)
Inpatient Rehab Discharge Instructions  [name] Discharge date and time: 08/26/20   Activities/Precautions/ Functional Status: Activity: no lifting, driving, or strenuous exercise till cleared by MD Diet: cardiac diet Wound Care: none needed    Functional status:  ___ No restrictions     ___ Walk up steps independently _X__ 24/7 supervision/assistance   ___ Walk up steps with assistance ___ Intermittent supervision/assistance  ___ Bathe/dress independently ___ Walk with walker     _X__ Bathe/dress with assistance ___ Walk Independently    ___ Shower independently ___ Walk with assistance    ___ Shower with assistance _X__ No alcohol     ___ Return to work/school ________   Special Instructions: 1. No driving till cleared by MD.    COMMUNITY REFERRALS UPON DISCHARGE:    Outpatient: PT             Agency:[facility]  Phone: [phone]             Appointment Date/Time:WILL CONTACT PATIENT TO SET UP FOLLOW UP APPOINTMENT  Medical Equipment/Items Ordered:TALL Levan Hurst                                                  Agency/Supplier:[facility]  [phone]    STROKE/TIA DISCHARGE INSTRUCTIONS SMOKING Cigarette smoking nearly doubles your risk of having a stroke & is the single most alterable risk factor  If you smoke or have smoked in the last 12 months, you are advised to quit smoking for your health.  Most of the excess cardiovascular risk related to smoking disappears within a year of stopping.  Ask you doctor about anti-smoking medications  [place] Quit Line: 1-800-QUIT NOW  Free Smoking Cessation Classes [phone]  CHOLESTEROL Know your levels; limit fat & cholesterol in your diet  Lipid Panel     Component Value Date/Time   CHOL 83 08/16/2020 0453   TRIG 68 08/16/2020 0453   HDL 28 (L) 08/16/2020 0453   CHOLHDL 3.0 08/16/2020 0453   VLDL 14 08/16/2020 0453   LDLCALC 41 08/16/2020 0453      Many patients benefit from treatment  even if their cholesterol is at goal.  Goal: Total Cholesterol (CHOL) less than 160  Goal:  Triglycerides (TRIG) less than 150  Goal:  HDL greater than 40  Goal:  LDL (LDLCALC) less than 100   BLOOD PRESSURE American Stroke Association blood pressure target is less that 120/80 mm/Hg  Your discharge blood pressure is:  BP: 110/70  Monitor your blood pressure  Limit your salt and alcohol intake  Many individuals will require more than one medication for high blood pressure  DIABETES (A1c is a blood sugar average for last 3 months) Goal HGBA1c is under 7% (HBGA1c is blood sugar average for last 3 months)  Diabetes:     Lab Results  Component Value Date   HGBA1C 5.8 (H) 08/16/2020     Your HGBA1c can be lowered with medications, healthy diet, and exercise.  Check your blood sugar as directed by your physician  Call your physician if you experience unexplained or low blood sugars.  PHYSICAL ACTIVITY/REHABILITATION Goal is 30 minutes at least 4 days per week  Activity: No driving, Therapies: see above Return to work: N/A  Activity decreases your risk of heart attack and stroke and makes your heart stronger.  It helps  control your weight and blood pressure; helps you relax and can improve your mood.  Participate in a regular exercise program.  Talk with your doctor about the best form of exercise for you (dancing, walking, swimming, cycling).  DIET/WEIGHT Goal is to maintain a healthy weight  Your discharge diet is:  Diet Order            Diet Heart Room service appropriate? Yes with Assist; Fluid consistency: Thin  Diet effective now                 liquids Your height is:  6'3" Your current weight is: Weight: 118.5 kg Your Body Mass Index (BMI) is:  BMI (Calculated): 32.65  Following the type of diet specifically designed for you will help prevent another stroke.  Your goal weight is:    Your goal Body Mass Index (BMI) is 19-24.  Healthy food habits can help  reduce 3 risk factors for stroke:  High cholesterol, hypertension, and excess weight.  RESOURCES Stroke/Support Group:  Call [phone]   STROKE EDUCATION PROVIDED/REVIEWED AND GIVEN TO PATIENT Stroke warning signs and symptoms How to activate emergency medical system (call 911). Medications prescribed at discharge. Need for follow-up after discharge. Personal risk factors for stroke. Pneumonia vaccine given:  Flu vaccine given:  My questions have been answered, the writing is legible, and I understand these instructions.  I will adhere to these goals & educational materials that have been provided to me after my discharge from the hospital.     My questions have been answered and I understand these instructions. I will adhere to these goals and the provided educational materials after my discharge from the hospital.  Patient/Caregiver Signature _______________________________ Date __________  Clinician Signature _______________________________________ Date __________  Please bring this form and your medication list with you to all your follow-up doctor's appointments. Information on my medicine - ELIQUIS (apixaban)  This medication education was reviewed with me or my healthcare representative as part of my discharge preparation.  The pharmacist that spoke with me during my hospital stay was:  Ulyses Southward, RPH-CPP  Why was Eliquis prescribed for you? Eliquis was prescribed for you to reduce the risk of a blood clot forming that can cause a stroke if you have a medical condition called atrial fibrillation (a type of irregular heartbeat).  What do You need to know about Eliquis ? Take your Eliquis TWICE DAILY - one tablet in the morning and one tablet in the evening with or without food. If you have difficulty swallowing the tablet whole please discuss with your pharmacist how to take the medication safely.  Take Eliquis exactly as prescribed by your doctor and DO NOT stop taking  Eliquis without talking to the doctor who prescribed the medication.  Stopping may increase your risk of developing a stroke.  Refill your prescription before you run out.  After discharge, you should have regular check-up appointments with your healthcare provider that is prescribing your Eliquis.  In the future your dose may need to be changed if your kidney function or weight changes by a significant amount or as you get older.  What do you do if you miss a dose? If you miss a dose, take it as soon as you remember on the same day and resume taking twice daily.  Do not take more than one dose of ELIQUIS at the same time to make up a missed dose.  Important Safety Information A possible side effect of Eliquis is bleeding.  You should call your healthcare provider right away if you experience any of the following: ? Bleeding from an injury or your nose that does not stop. ? Unusual colored urine (red or dark brown) or unusual colored stools (red or black). ? Unusual bruising for unknown reasons. ? A serious fall or if you hit your head (even if there is no bleeding).  Some medicines may interact with Eliquis and might increase your risk of bleeding or clotting while on Eliquis. To help avoid this, consult your healthcare provider or pharmacist prior to using any new prescription or non-prescription medications, including herbals, vitamins, non-steroidal anti-inflammatory drugs (NSAIDs) and supplements.  This website has more information on Eliquis (apixaban): http://www.eliquis.com/eliquis/home

## 2020-08-26 NOTE — Progress Notes (Signed)
Patient ID: [name], male   DOB: 1941/01/31, 80 y.o.   MRN: [id number] Follow up with the patient regarding pending discharge and preparation for going home. Patient reported he felt prepared for discharge and did not have any questions or concerns regarding medications or dietary modifications as discussed previously. Patient noted he had worked out a support group system and felt pretty good about being able to manage self care and look after his wife. Pamelia Hoit

## 2020-08-30 DIAGNOSIS — D649 Anemia, unspecified: Secondary | ICD-10-CM

## 2020-08-30 DIAGNOSIS — H5340 Unspecified visual field defects: Secondary | ICD-10-CM

## 2020-09-17 ENCOUNTER — Other Ambulatory Visit: Payer: Self-pay | Admitting: Physical Medicine and Rehabilitation

## 2020-09-18 ENCOUNTER — Other Ambulatory Visit: Payer: Self-pay | Admitting: Physical Medicine and Rehabilitation

## 2020-09-22 ENCOUNTER — Other Ambulatory Visit: Payer: Self-pay | Admitting: Physical Medicine and Rehabilitation

## 2020-10-11 ENCOUNTER — Telehealth: Payer: Self-pay

## 2020-10-11 ENCOUNTER — Encounter: Payer: Self-pay | Admitting: Adult Health

## 2020-10-11 ENCOUNTER — Ambulatory Visit: Payer: Medicare Other | Admitting: Adult Health

## 2020-10-11 VITALS — BP 124/77 | HR 57 | Ht 75.0 in | Wt 250.0 lb

## 2020-10-11 DIAGNOSIS — I639 Cerebral infarction, unspecified: Secondary | ICD-10-CM | POA: Diagnosis not present

## 2020-10-11 DIAGNOSIS — I69354 Hemiplegia and hemiparesis following cerebral infarction affecting left non-dominant side: Secondary | ICD-10-CM

## 2020-10-11 DIAGNOSIS — H53462 Homonymous bilateral field defects, left side: Secondary | ICD-10-CM

## 2020-10-11 DIAGNOSIS — I10 Essential (primary) hypertension: Secondary | ICD-10-CM

## 2020-10-11 DIAGNOSIS — I69319 Unspecified symptoms and signs involving cognitive functions following cerebral infarction: Secondary | ICD-10-CM

## 2020-10-11 DIAGNOSIS — E785 Hyperlipidemia, unspecified: Secondary | ICD-10-CM
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
## 2020-10-11 NOTE — Telephone Encounter (Signed)
Referral has been sent to [facility]. P: [phone]. F: [phone].

## 2020-10-11 NOTE — Patient Instructions (Signed)
Referral will be placed for home health physical, occupational, and speech therapy as well as nursing services  Referral will be placed for evaluation by ophthalmology for more extensive vision exam  Continue Eliquis (apixaban) daily  and atorvastatin for secondary stroke prevention  Continue to follow up with PCP regarding cholesterol and blood pressure management  Maintain strict control of hypertension with blood pressure goal below 130/90 and cholesterol with LDL cholesterol (bad cholesterol) goal below 70 mg/dL.       Followup in the future with me in 3 months or call earlier if needed       Thank you for coming to see [place] at [facility]. I hope we have been able to provide you high quality care today.  You may receive a patient satisfaction survey over the next few weeks. We would appreciate your feedback and comments so that we may continue to improve ourselves and the health of our patients.    Ischemic Stroke  An ischemic stroke is the sudden death of brain tissue. This type of stroke happens when part of the brain does not get enough blood. This can cause lifelong change in how the brain works. This change can cause problems in otherparts of the body. This condition is an emergency. It must be treated right away. What are the causes? This condition is caused by lower blood flow to part of the brain. This may be due to: A small clump, or clot, of blood. A buildup of fatty substance (plaque) in the blood vessels. An abnormal heart rhythm. A blocked or damaged artery in the head or neck. Arteries are blood vessels that move blood away from the heart. Infection. Swelling of the arteries in the brain. What increases the risk? Things that you can change Other medical problems, such as: High blood pressure (hypertension). Heart disease. Diabetes. High cholesterol. Being very overweight (obese). Paused or stopped breathing during sleep (sleep  apnea). Migraine headache. Smoking or other tobacco use. Not being active. Heavy alcohol use. Using drugs. Taking birth control pills. Things that you cannot change Being older than age 50. Having had blood clots, stroke, or mini-stroke (transient ischemic attack, TIA) before. High blood pressure when you are pregnant, in women. Stroke in your family. Sickle cell disease. Disorders that affect how blood clots. What are the signs or symptoms? Symptoms of a stroke normally happen all of a sudden. They can include: Weakness or loss of feeling in your face, arm, or leg, often on one side of the body. Loss of balance. Loss of controlled, correct movement of your body parts (coordination). Slurred speech. Trouble talking or trouble understanding what people say. Problems with not seeing things correctly. Feeling dizzy or confused. Feeling like you may vomit (nauseous) and vomiting. A very bad headache for no reason. If you can, write down the exact time that you last felt normal and what time you first had symptoms. Tell your doctor. If symptoms come and go, they couldbe caused by a mini-stroke. Get help right away, even if you feel better. How is this treated? You must get treatment as soon as you have stroke symptoms. Some treatments work better if they are done within 3-6 hours of your first symptoms. You may get medicines that: Take out or break up the blood clot. Control blood pressure. Thin your blood. Other treatments may include: Treatment for breathing. Fluids through an IV tube. Procedures that make blood flow better. You may need to manage your risk of stroke  with medicines and diet changes.After a stroke, you may get therapy to help you get better. Follow these instructions at home: Medicines Take over-the-counter and prescription medicines only as told by your doctor. If you were told to take aspirin or another medicine to thin your blood, take it exactly as told. Take  it at the same time each day. Taking too much of the medicine can cause bleeding. If you do not take enough medicine, it may not work as well. Know the side effects of your medicines. If you are taking a blood thinner, make sure you: Hold pressure over any cuts for longer than normal. Tell your dentist and other doctors that you take this medicine. Avoid activities that could hurt or bruise you. Eating and drinking Follow instructions from your doctor about diet. Eat healthy foods. If you have trouble swallowing: Take small bites when eating. Eat foods that are soft or pureed. Safety Follow instructions from your care team about physical activity. Use a walker or cane as told by your doctor. Keep your home safe so you do not fall. You may need to: Have experts look at your home to make sure it is safe. Put grab bars in the bedroom and bathroom. Use raised toilets. Put a seat in the shower. General instructions Do not use any products that contain nicotine or tobacco, such as cigarettes, e-cigarettes, and chewing tobacco. If you need help quitting, ask your doctor. If you drink alcohol: Limit how much you use to: 0-1 drink a day for women. 0-2 drinks a day for men. Be aware of how much alcohol is in your drink. In the [place] one drink equals one 12 oz bottle of beer (355 mL), one 5 oz glass of wine (148 mL), or one 1 oz glass of hard liquor (44 mL). If you need help to stop using drugs or alcohol, ask your doctor to refer you to a program or specialist. Stay active. Exercise as told. Wear a medical bracelet as told by your doctor. Keep all follow-up visits as told by your doctor. Go to visits with all specialists on your care team. This is important. How is this prevented? You can lower your risk of another stroke if you: Manage high blood pressure, high cholesterol, diabetes, heart disease, sleep problems, and weight. Quit smoking, limit alcohol, and stay active. Work with your  doctor to care for yourself after a stroke. This may keep youfrom getting more problems. Get help right away if: You have any signs of a stroke. "BE FAST" is an easy way to remember the main warning signs: B - Balance. Signs are dizziness, sudden trouble walking, or loss of balance. E - Eyes. Signs are trouble seeing or a change in how you see. F - Face. Signs are sudden weakness or loss of feeling of the face, or the face or eyelid drooping on one side. A - Arms. Signs are weakness or loss of feeling in an arm. This happens suddenly and usually on one side of the body. S - Speech. Signs are sudden trouble speaking, slurred speech, or trouble understanding what people say. T - Time. Time to call emergency services. Write down what time symptoms started. You have other signs of a stroke, such as: A sudden, very bad headache with no known cause. Feeling like you may vomit. Vomiting. A seizure. These symptoms may be an emergency. Do not wait to see if the symptoms will go away. Get medical help right away. Call your local  emergency services (911 in the [place] Do not drive yourself to the hospital. Summary An ischemic stroke is the sudden death of brain tissue. Symptoms of a stroke often happen all of a sudden. You must get treatment as soon as you have stroke symptoms. Stroke is an emergency. It must be treated right away. This information is not intended to replace advice given to you by your health care provider. Make sure you discuss any questions you have with your healthcare provider. Document Revised: 04/07/2019 Document Reviewed: 04/07/2019 Elsevier Patient Education  2021 Elsevier Inc. Cognitive Rehabilitation After a Stroke A stroke is caused by not getting enough blood flow to the brain. This can affect a person's thinking and memory. Rehabilitation is important for improving these areas. It can help with attention span, decision-making (executive function), and with the ability to  recall information, such as words and objects. It also can help with language and perception. Rehabilitation can help to improvequality of life. What is cognitive rehabilitation? Cognitive rehabilitation is a program to help you improve your thinking skills after a stroke. It can help with memory, problem-solving, and communication skills. Therapy focuses on: Improving brain function. This may involve learning to break down tasks into simple steps. Coping with thinking problems. You might learn ways to help improve your memory or do activities that stimulate memory. These may include naming objects or describing pictures. Types of rehabilitation Cognitive rehabilitation refers to a group of therapies and may include: Speech-language therapy to help you understand and communicate better. Occupational therapy to help you with daily activities. Music therapy to help with stress, anxiety, and depression. This may involve listening to music, singing, or playing instruments. Physical therapy to help you get stronger and move better. These therapies may involve: Helping you to learn ways to cope with memory problems, such as setting alarms to remind you to take medicines. You also may learn new strategies to help your memory. Using virtual reality or video games to help you remember words, names of objects, and other things. Exercise to help increase blood flow to the brain. Summary After a stroke, some people have problems with thinking, memory, language, communication, and problem-solving. Cognitive rehabilitation is a program to help you regain brain function and learn skills to cope with thinking problems. Rehabilitation can help to improve quality of life. Cognitive rehabilitation may include speech-language therapy, occupational therapy, music therapy, and physical therapy. This information is not intended to replace advice given to you by your health care provider. Make sure you discuss any  questions you have with your healthcare provider. Document Revised: 03/12/2020 Document Reviewed: 03/12/2020 Elsevier Patient Education  2022 [employer].

## 2020-10-12 ENCOUNTER — Other Ambulatory Visit: Payer: Self-pay | Admitting: Physical Medicine and Rehabilitation

## 2020-10-12 NOTE — Telephone Encounter (Signed)
I have called several home health agencies in and around [place], [place].  I have been unable to find any with adequate staffing or that will accept his insurance.  I called patient.  He recommends I call his brother to obtain more information about home health agencies locally.  I advised him that I have sent his referral for ophthalmology to [facility].  Patient verbalized understanding and agreement.  I called patient's brother, Jonny Ruiz.  His phone number is [phone].  He is not aware of any home health agencies locally.  The address he will be living at is [address], [place], [name] [address].  I will reach out to [facility] home health.

## 2020-10-21 ENCOUNTER — Ambulatory Visit: Payer: Medicare Other | Admitting: Podiatry

## 2020-10-22 NOTE — Progress Notes (Signed)
I agree with the above plan 

## 2020-10-26 NOTE — Telephone Encounter (Signed)
Home Health referral was accepted by [facility] in [place], [name]. Received this notice from [facility]: "Finally able to get some from the [facility] on the phone. They have accepted the referral. They spoke with [name] and he asked that the branch talked to his brother. Brother asked to delay until 7/5."

## 2020-10-28 ENCOUNTER — Ambulatory Visit (INDEPENDENT_AMBULATORY_CARE_PROVIDER_SITE_OTHER): Payer: Medicare Other | Admitting: Podiatry

## 2020-10-28 ENCOUNTER — Other Ambulatory Visit: Payer: Self-pay

## 2020-10-28 ENCOUNTER — Encounter: Payer: Self-pay | Admitting: Podiatry

## 2020-10-28 DIAGNOSIS — L84 Corns and callosities: Secondary | ICD-10-CM

## 2020-10-28 DIAGNOSIS — M79675 Pain in left toe(s): Secondary | ICD-10-CM

## 2020-10-28 DIAGNOSIS — M79674 Pain in right toe(s): Secondary | ICD-10-CM

## 2020-10-28 DIAGNOSIS — R3914 Feeling of incomplete bladder emptying: Secondary | ICD-10-CM | POA: Diagnosis not present

## 2020-10-28 DIAGNOSIS — Z89421 Acquired absence of other right toe(s): Secondary | ICD-10-CM

## 2020-10-28 DIAGNOSIS — B351 Tinea unguium: Secondary | ICD-10-CM

## 2020-10-28 DIAGNOSIS — Z7901 Long term (current) use of anticoagulants: Secondary | ICD-10-CM

## 2020-10-28 NOTE — Patient Instructions (Signed)
  [facility]  [address] [place], [place] [address] PHONE [phone]   [employer] Mariann Laster PATEL, DPM Johny Blamer, IV, DPM KATLIN Dierdre Forth, DPM

## 2020-10-31 NOTE — Progress Notes (Signed)
  Subjective:  Patient ID: ALA CAPRI, male    DOB: Feb 01, 1941,  MRN: [id number]  Chief Complaint  Patient presents with   Callouses      right foot toe with callus is bleeding/added per Dr. Logan Bores    80 y.o. male returns with the above complaint. History confirmed with patient. Wound is doing well and is healed.  He is relocating to [place] after the recent death of his wife  Objective:  Physical Exam: warm, good capillary refill and normal DP and PT pulses.  Previous amputated right fourth and fifth toes.  Callus present right third toe. Onychomycosis x7 Assessment:   1. Pain due to onychomycosis of toenails of both feet   2. Pre-ulcerative calluses       Plan:  Patient was evaluated and treated and all questions answered.  Patient educated on diabetes. Discussed proper diabetic foot care and discussed risks and complications of disease. Educated patient in depth on reasons to return to the office immediately should he/she discover anything concerning or new on the feet. All questions answered. Discussed proper shoes as well.   Discussed the etiology and treatment options for the condition in detail with the patient. Educated patient on the topical and oral treatment options for mycotic nails. Recommended debridement of the nails today. Sharp and mechanical debridement performed of all painful and mycotic nails today. Nails debrided in length and thickness using a nail nipper to level of comfort. Discussed treatment options including appropriate shoe gear. Follow up as needed for painful nails.  All symptomatic hyperkeratoses were safely debrided with a sterile #15 blade to patient's level of comfort without incident. We discussed preventative and palliative care of these lesions including supportive and accommodative shoegear, padding, prefabricated and custom molded accommodative orthoses, use of a pumice stone and lotions/creams daily.  Given referral to instruct foot and  ankle and [facility] in [place] to transition his care.  Return if symptoms worsen or fail to improve.

## 2020-11-08 DIAGNOSIS — F32A Depression, unspecified: Secondary | ICD-10-CM | POA: Diagnosis not present

## 2020-11-08 DIAGNOSIS — J449 Chronic obstructive pulmonary disease, unspecified: Secondary | ICD-10-CM | POA: Diagnosis not present

## 2020-11-11 DIAGNOSIS — Z9181 History of falling: Secondary | ICD-10-CM | POA: Diagnosis not present

## 2020-11-11 DIAGNOSIS — Z7901 Long term (current) use of anticoagulants: Secondary | ICD-10-CM | POA: Diagnosis not present

## 2020-11-11 DIAGNOSIS — K5901 Slow transit constipation: Secondary | ICD-10-CM | POA: Diagnosis not present

## 2020-11-11 DIAGNOSIS — Z86711 Personal history of pulmonary embolism: Secondary | ICD-10-CM | POA: Diagnosis not present

## 2020-11-11 DIAGNOSIS — E041 Nontoxic single thyroid nodule: Secondary | ICD-10-CM | POA: Diagnosis not present

## 2020-11-11 DIAGNOSIS — I69354 Hemiplegia and hemiparesis following cerebral infarction affecting left non-dominant side: Secondary | ICD-10-CM | POA: Diagnosis not present

## 2020-11-11 DIAGNOSIS — G252 Other specified forms of tremor: Secondary | ICD-10-CM | POA: Diagnosis not present

## 2020-11-11 DIAGNOSIS — E871 Hypo-osmolality and hyponatremia: Secondary | ICD-10-CM | POA: Diagnosis not present

## 2020-11-11 DIAGNOSIS — R338 Other retention of urine: Secondary | ICD-10-CM | POA: Diagnosis not present

## 2020-11-11 DIAGNOSIS — M1990 Unspecified osteoarthritis, unspecified site: Secondary | ICD-10-CM | POA: Diagnosis not present

## 2020-11-11 DIAGNOSIS — E785 Hyperlipidemia, unspecified: Secondary | ICD-10-CM | POA: Diagnosis not present

## 2020-11-11 DIAGNOSIS — I10 Essential (primary) hypertension: Secondary | ICD-10-CM | POA: Diagnosis not present

## 2020-11-11 DIAGNOSIS — B351 Tinea unguium: Secondary | ICD-10-CM | POA: Diagnosis not present

## 2020-11-11 DIAGNOSIS — D62 Acute posthemorrhagic anemia: Secondary | ICD-10-CM | POA: Diagnosis not present

## 2020-11-11 DIAGNOSIS — Z86718 Personal history of other venous thrombosis and embolism: Secondary | ICD-10-CM | POA: Diagnosis not present

## 2020-11-12 DIAGNOSIS — G252 Other specified forms of tremor: Secondary | ICD-10-CM | POA: Diagnosis not present

## 2020-11-12 DIAGNOSIS — Z7901 Long term (current) use of anticoagulants: Secondary | ICD-10-CM | POA: Diagnosis not present

## 2020-11-12 DIAGNOSIS — R338 Other retention of urine: Secondary | ICD-10-CM | POA: Diagnosis not present

## 2020-11-12 DIAGNOSIS — B351 Tinea unguium: Secondary | ICD-10-CM | POA: Diagnosis not present

## 2020-11-12 DIAGNOSIS — K5901 Slow transit constipation: Secondary | ICD-10-CM | POA: Diagnosis not present

## 2020-11-12 DIAGNOSIS — Z86711 Personal history of pulmonary embolism: Secondary | ICD-10-CM | POA: Diagnosis not present

## 2020-11-12 DIAGNOSIS — E785 Hyperlipidemia, unspecified: Secondary | ICD-10-CM | POA: Diagnosis not present

## 2020-11-12 DIAGNOSIS — Z86718 Personal history of other venous thrombosis and embolism: Secondary | ICD-10-CM | POA: Diagnosis not present

## 2020-11-12 DIAGNOSIS — I10 Essential (primary) hypertension: Secondary | ICD-10-CM | POA: Diagnosis not present

## 2020-11-12 DIAGNOSIS — Z9181 History of falling: Secondary | ICD-10-CM | POA: Diagnosis not present

## 2020-11-12 DIAGNOSIS — M1990 Unspecified osteoarthritis, unspecified site: Secondary | ICD-10-CM | POA: Diagnosis not present

## 2020-11-12 DIAGNOSIS — I69354 Hemiplegia and hemiparesis following cerebral infarction affecting left non-dominant side: Secondary | ICD-10-CM | POA: Diagnosis not present

## 2020-11-12 DIAGNOSIS — D62 Acute posthemorrhagic anemia: Secondary | ICD-10-CM | POA: Diagnosis not present

## 2020-11-12 DIAGNOSIS — E041 Nontoxic single thyroid nodule: Secondary | ICD-10-CM | POA: Diagnosis not present

## 2020-11-12 DIAGNOSIS — E871 Hypo-osmolality and hyponatremia: Secondary | ICD-10-CM | POA: Diagnosis not present

## 2020-11-15 ENCOUNTER — Encounter: Payer: Medicare Other | Admitting: Physical Medicine and Rehabilitation

## 2020-11-16 ENCOUNTER — Other Ambulatory Visit: Payer: Self-pay

## 2020-11-16 NOTE — Patient Outreach (Signed)
[facility]) Care Management  11/16/2020  KAYLEB WARSHAW Sep 27, 1940 [id number]   First telephone outreach attempt to obtain mRS. No answer. Left message for returned call.  [name] [facility] Management Assistant [phone]

## 2020-11-17 DIAGNOSIS — Z86711 Personal history of pulmonary embolism: Secondary | ICD-10-CM | POA: Diagnosis not present

## 2020-11-17 DIAGNOSIS — E871 Hypo-osmolality and hyponatremia: Secondary | ICD-10-CM | POA: Diagnosis not present

## 2020-11-17 DIAGNOSIS — I69354 Hemiplegia and hemiparesis following cerebral infarction affecting left non-dominant side: Secondary | ICD-10-CM | POA: Diagnosis not present

## 2020-11-17 DIAGNOSIS — E785 Hyperlipidemia, unspecified: Secondary | ICD-10-CM | POA: Diagnosis not present

## 2020-11-17 DIAGNOSIS — B351 Tinea unguium: Secondary | ICD-10-CM | POA: Diagnosis not present

## 2020-11-17 DIAGNOSIS — R338 Other retention of urine: Secondary | ICD-10-CM | POA: Diagnosis not present

## 2020-11-17 DIAGNOSIS — M1990 Unspecified osteoarthritis, unspecified site: Secondary | ICD-10-CM | POA: Diagnosis not present

## 2020-11-17 DIAGNOSIS — Z7901 Long term (current) use of anticoagulants: Secondary | ICD-10-CM | POA: Diagnosis not present

## 2020-11-17 DIAGNOSIS — E041 Nontoxic single thyroid nodule: Secondary | ICD-10-CM | POA: Diagnosis not present

## 2020-11-17 DIAGNOSIS — Z86718 Personal history of other venous thrombosis and embolism: Secondary | ICD-10-CM | POA: Diagnosis not present

## 2020-11-17 DIAGNOSIS — Z9181 History of falling: Secondary | ICD-10-CM | POA: Diagnosis not present

## 2020-11-17 DIAGNOSIS — G252 Other specified forms of tremor: Secondary | ICD-10-CM | POA: Diagnosis not present

## 2020-11-17 DIAGNOSIS — I10 Essential (primary) hypertension: Secondary | ICD-10-CM | POA: Diagnosis not present

## 2020-11-17 DIAGNOSIS — D62 Acute posthemorrhagic anemia: Secondary | ICD-10-CM | POA: Diagnosis not present

## 2020-11-17 DIAGNOSIS — K5901 Slow transit constipation: Secondary | ICD-10-CM | POA: Diagnosis not present

## 2020-11-18 DIAGNOSIS — Z7901 Long term (current) use of anticoagulants: Secondary | ICD-10-CM | POA: Diagnosis not present

## 2020-11-18 DIAGNOSIS — I69354 Hemiplegia and hemiparesis following cerebral infarction affecting left non-dominant side: Secondary | ICD-10-CM | POA: Diagnosis not present

## 2020-11-18 DIAGNOSIS — Z86718 Personal history of other venous thrombosis and embolism: Secondary | ICD-10-CM | POA: Diagnosis not present

## 2020-11-18 DIAGNOSIS — I10 Essential (primary) hypertension: Secondary | ICD-10-CM | POA: Diagnosis not present

## 2020-11-18 DIAGNOSIS — N159 Renal tubulo-interstitial disease, unspecified: Secondary | ICD-10-CM | POA: Diagnosis not present

## 2020-11-18 DIAGNOSIS — E782 Mixed hyperlipidemia: Secondary | ICD-10-CM | POA: Diagnosis not present

## 2020-11-18 DIAGNOSIS — K219 Gastro-esophageal reflux disease without esophagitis: Secondary | ICD-10-CM | POA: Diagnosis not present

## 2020-11-18 DIAGNOSIS — K5901 Slow transit constipation: Secondary | ICD-10-CM | POA: Diagnosis not present

## 2020-11-18 DIAGNOSIS — M1990 Unspecified osteoarthritis, unspecified site: Secondary | ICD-10-CM | POA: Diagnosis not present

## 2020-11-18 DIAGNOSIS — G252 Other specified forms of tremor: Secondary | ICD-10-CM | POA: Diagnosis not present

## 2020-11-18 DIAGNOSIS — E871 Hypo-osmolality and hyponatremia: Secondary | ICD-10-CM | POA: Diagnosis not present

## 2020-11-18 DIAGNOSIS — Z86711 Personal history of pulmonary embolism: Secondary | ICD-10-CM | POA: Diagnosis not present

## 2020-11-18 DIAGNOSIS — E785 Hyperlipidemia, unspecified: Secondary | ICD-10-CM | POA: Diagnosis not present

## 2020-11-18 DIAGNOSIS — Z9181 History of falling: Secondary | ICD-10-CM | POA: Diagnosis not present

## 2020-11-18 DIAGNOSIS — D62 Acute posthemorrhagic anemia: Secondary | ICD-10-CM | POA: Diagnosis not present

## 2020-11-18 DIAGNOSIS — E041 Nontoxic single thyroid nodule: Secondary | ICD-10-CM | POA: Diagnosis not present

## 2020-11-18 DIAGNOSIS — B351 Tinea unguium: Secondary | ICD-10-CM | POA: Diagnosis not present

## 2020-11-18 DIAGNOSIS — G459 Transient cerebral ischemic attack, unspecified: Secondary | ICD-10-CM | POA: Diagnosis not present

## 2020-11-18 DIAGNOSIS — R338 Other retention of urine: Secondary | ICD-10-CM | POA: Diagnosis not present

## 2020-11-22 ENCOUNTER — Other Ambulatory Visit: Payer: Self-pay

## 2020-11-22 DIAGNOSIS — H53469 Homonymous bilateral field defects, unspecified side: Secondary | ICD-10-CM | POA: Diagnosis not present

## 2020-11-22 DIAGNOSIS — H353132 Nonexudative age-related macular degeneration, bilateral, intermediate dry stage: Secondary | ICD-10-CM | POA: Diagnosis not present

## 2020-11-22 DIAGNOSIS — Z961 Presence of intraocular lens: Secondary | ICD-10-CM | POA: Diagnosis not present

## 2020-11-22 NOTE — Patient Outreach (Signed)
[facility]) Care Management  11/22/2020  [name] 1940-10-22 [id number]   Second telephone outreach attempt to obtain mRS. No answer. Left message for returned call.  [name] [name] Management Assistant [phone]

## 2020-11-23 DIAGNOSIS — M1990 Unspecified osteoarthritis, unspecified site: Secondary | ICD-10-CM | POA: Diagnosis not present

## 2020-11-23 DIAGNOSIS — D62 Acute posthemorrhagic anemia: Secondary | ICD-10-CM | POA: Diagnosis not present

## 2020-11-23 DIAGNOSIS — R338 Other retention of urine: Secondary | ICD-10-CM | POA: Diagnosis not present

## 2020-11-23 DIAGNOSIS — K5901 Slow transit constipation: Secondary | ICD-10-CM | POA: Diagnosis not present

## 2020-11-23 DIAGNOSIS — Z7901 Long term (current) use of anticoagulants: Secondary | ICD-10-CM | POA: Diagnosis not present

## 2020-11-23 DIAGNOSIS — I69354 Hemiplegia and hemiparesis following cerebral infarction affecting left non-dominant side: Secondary | ICD-10-CM | POA: Diagnosis not present

## 2020-11-23 DIAGNOSIS — I10 Essential (primary) hypertension: Secondary | ICD-10-CM | POA: Diagnosis not present

## 2020-11-23 DIAGNOSIS — G252 Other specified forms of tremor: Secondary | ICD-10-CM | POA: Diagnosis not present

## 2020-11-23 DIAGNOSIS — E785 Hyperlipidemia, unspecified: Secondary | ICD-10-CM | POA: Diagnosis not present

## 2020-11-23 DIAGNOSIS — B351 Tinea unguium: Secondary | ICD-10-CM | POA: Diagnosis not present

## 2020-11-23 DIAGNOSIS — E871 Hypo-osmolality and hyponatremia: Secondary | ICD-10-CM | POA: Diagnosis not present

## 2020-11-23 DIAGNOSIS — Z86711 Personal history of pulmonary embolism: Secondary | ICD-10-CM | POA: Diagnosis not present

## 2020-11-23 DIAGNOSIS — E041 Nontoxic single thyroid nodule: Secondary | ICD-10-CM | POA: Diagnosis not present

## 2020-11-23 DIAGNOSIS — Z9181 History of falling: Secondary | ICD-10-CM | POA: Diagnosis not present

## 2020-11-23 DIAGNOSIS — Z86718 Personal history of other venous thrombosis and embolism: Secondary | ICD-10-CM | POA: Diagnosis not present

## 2020-11-25 ENCOUNTER — Other Ambulatory Visit: Payer: Self-pay

## 2020-11-25 NOTE — Patient Outreach (Signed)
[facility]) Care Management  11/25/2020  [name] [birth year] [id number]   3 outreach attempts were completed to obtain mRs. mRs could not be obtained because patient never returned my calls. mRs=7    [name] Care Management Assistant [phone]

## 2020-11-29 DIAGNOSIS — D62 Acute posthemorrhagic anemia: Secondary | ICD-10-CM | POA: Diagnosis not present

## 2020-11-29 DIAGNOSIS — B351 Tinea unguium: Secondary | ICD-10-CM | POA: Diagnosis not present

## 2020-11-29 DIAGNOSIS — E785 Hyperlipidemia, unspecified: Secondary | ICD-10-CM | POA: Diagnosis not present

## 2020-11-29 DIAGNOSIS — E041 Nontoxic single thyroid nodule: Secondary | ICD-10-CM | POA: Diagnosis not present

## 2020-11-29 DIAGNOSIS — I69354 Hemiplegia and hemiparesis following cerebral infarction affecting left non-dominant side: Secondary | ICD-10-CM | POA: Diagnosis not present

## 2020-11-29 DIAGNOSIS — I10 Essential (primary) hypertension: Secondary | ICD-10-CM | POA: Diagnosis not present

## 2020-11-29 DIAGNOSIS — E871 Hypo-osmolality and hyponatremia: Secondary | ICD-10-CM | POA: Diagnosis not present

## 2020-11-29 DIAGNOSIS — R338 Other retention of urine: Secondary | ICD-10-CM | POA: Diagnosis not present

## 2020-11-29 DIAGNOSIS — M1990 Unspecified osteoarthritis, unspecified site: Secondary | ICD-10-CM | POA: Diagnosis not present

## 2020-11-29 DIAGNOSIS — Z7901 Long term (current) use of anticoagulants: Secondary | ICD-10-CM | POA: Diagnosis not present

## 2020-11-29 DIAGNOSIS — K5901 Slow transit constipation: Secondary | ICD-10-CM | POA: Diagnosis not present

## 2020-11-29 DIAGNOSIS — Z9181 History of falling: Secondary | ICD-10-CM | POA: Diagnosis not present

## 2020-11-29 DIAGNOSIS — Z86711 Personal history of pulmonary embolism: Secondary | ICD-10-CM | POA: Diagnosis not present

## 2020-11-29 DIAGNOSIS — Z86718 Personal history of other venous thrombosis and embolism: Secondary | ICD-10-CM | POA: Diagnosis not present

## 2020-11-29 DIAGNOSIS — G252 Other specified forms of tremor: Secondary | ICD-10-CM | POA: Diagnosis not present

## 2020-11-30 DIAGNOSIS — Z7901 Long term (current) use of anticoagulants: Secondary | ICD-10-CM | POA: Diagnosis not present

## 2020-11-30 DIAGNOSIS — Z9181 History of falling: Secondary | ICD-10-CM | POA: Diagnosis not present

## 2020-11-30 DIAGNOSIS — B351 Tinea unguium: Secondary | ICD-10-CM | POA: Diagnosis not present

## 2020-11-30 DIAGNOSIS — I10 Essential (primary) hypertension: Secondary | ICD-10-CM | POA: Diagnosis not present

## 2020-11-30 DIAGNOSIS — Z86711 Personal history of pulmonary embolism: Secondary | ICD-10-CM | POA: Diagnosis not present

## 2020-11-30 DIAGNOSIS — K5901 Slow transit constipation: Secondary | ICD-10-CM | POA: Diagnosis not present

## 2020-11-30 DIAGNOSIS — R338 Other retention of urine: Secondary | ICD-10-CM | POA: Diagnosis not present

## 2020-11-30 DIAGNOSIS — E041 Nontoxic single thyroid nodule: Secondary | ICD-10-CM | POA: Diagnosis not present

## 2020-11-30 DIAGNOSIS — G252 Other specified forms of tremor: Secondary | ICD-10-CM | POA: Diagnosis not present

## 2020-11-30 DIAGNOSIS — D62 Acute posthemorrhagic anemia: Secondary | ICD-10-CM | POA: Diagnosis not present

## 2020-11-30 DIAGNOSIS — Z86718 Personal history of other venous thrombosis and embolism: Secondary | ICD-10-CM | POA: Diagnosis not present

## 2020-11-30 DIAGNOSIS — E785 Hyperlipidemia, unspecified: Secondary | ICD-10-CM | POA: Diagnosis not present

## 2020-11-30 DIAGNOSIS — I69354 Hemiplegia and hemiparesis following cerebral infarction affecting left non-dominant side: Secondary | ICD-10-CM | POA: Diagnosis not present

## 2020-11-30 DIAGNOSIS — M1990 Unspecified osteoarthritis, unspecified site: Secondary | ICD-10-CM | POA: Diagnosis not present

## 2020-11-30 DIAGNOSIS — E871 Hypo-osmolality and hyponatremia: Secondary | ICD-10-CM | POA: Diagnosis not present

## 2020-12-01 DIAGNOSIS — B351 Tinea unguium: Secondary | ICD-10-CM | POA: Diagnosis not present

## 2020-12-01 DIAGNOSIS — R338 Other retention of urine: Secondary | ICD-10-CM | POA: Diagnosis not present

## 2020-12-01 DIAGNOSIS — E871 Hypo-osmolality and hyponatremia: Secondary | ICD-10-CM | POA: Diagnosis not present

## 2020-12-01 DIAGNOSIS — E785 Hyperlipidemia, unspecified: Secondary | ICD-10-CM | POA: Diagnosis not present

## 2020-12-01 DIAGNOSIS — Z9181 History of falling: Secondary | ICD-10-CM | POA: Diagnosis not present

## 2020-12-01 DIAGNOSIS — K5901 Slow transit constipation: Secondary | ICD-10-CM | POA: Diagnosis not present

## 2020-12-01 DIAGNOSIS — Z86711 Personal history of pulmonary embolism: Secondary | ICD-10-CM | POA: Diagnosis not present

## 2020-12-01 DIAGNOSIS — E041 Nontoxic single thyroid nodule: Secondary | ICD-10-CM | POA: Diagnosis not present

## 2020-12-01 DIAGNOSIS — M1990 Unspecified osteoarthritis, unspecified site: Secondary | ICD-10-CM | POA: Diagnosis not present

## 2020-12-01 DIAGNOSIS — Z7901 Long term (current) use of anticoagulants: Secondary | ICD-10-CM | POA: Diagnosis not present

## 2020-12-01 DIAGNOSIS — Z86718 Personal history of other venous thrombosis and embolism: Secondary | ICD-10-CM | POA: Diagnosis not present

## 2020-12-01 DIAGNOSIS — I10 Essential (primary) hypertension: Secondary | ICD-10-CM | POA: Diagnosis not present

## 2020-12-01 DIAGNOSIS — I69354 Hemiplegia and hemiparesis following cerebral infarction affecting left non-dominant side: Secondary | ICD-10-CM | POA: Diagnosis not present

## 2020-12-01 DIAGNOSIS — G252 Other specified forms of tremor: Secondary | ICD-10-CM | POA: Diagnosis not present

## 2020-12-01 DIAGNOSIS — D62 Acute posthemorrhagic anemia: Secondary | ICD-10-CM | POA: Diagnosis not present

## 2020-12-07 DIAGNOSIS — R338 Other retention of urine: Secondary | ICD-10-CM | POA: Diagnosis not present

## 2020-12-07 DIAGNOSIS — E785 Hyperlipidemia, unspecified: Secondary | ICD-10-CM | POA: Diagnosis not present

## 2020-12-07 DIAGNOSIS — Z7901 Long term (current) use of anticoagulants: Secondary | ICD-10-CM | POA: Diagnosis not present

## 2020-12-07 DIAGNOSIS — Z86718 Personal history of other venous thrombosis and embolism: Secondary | ICD-10-CM | POA: Diagnosis not present

## 2020-12-07 DIAGNOSIS — I10 Essential (primary) hypertension: Secondary | ICD-10-CM | POA: Diagnosis not present

## 2020-12-07 DIAGNOSIS — B351 Tinea unguium: Secondary | ICD-10-CM | POA: Diagnosis not present

## 2020-12-07 DIAGNOSIS — I69354 Hemiplegia and hemiparesis following cerebral infarction affecting left non-dominant side: Secondary | ICD-10-CM | POA: Diagnosis not present

## 2020-12-07 DIAGNOSIS — K5901 Slow transit constipation: Secondary | ICD-10-CM | POA: Diagnosis not present

## 2020-12-07 DIAGNOSIS — Z9181 History of falling: Secondary | ICD-10-CM | POA: Diagnosis not present

## 2020-12-07 DIAGNOSIS — G252 Other specified forms of tremor: Secondary | ICD-10-CM | POA: Diagnosis not present

## 2020-12-07 DIAGNOSIS — E041 Nontoxic single thyroid nodule: Secondary | ICD-10-CM | POA: Diagnosis not present

## 2020-12-07 DIAGNOSIS — M1990 Unspecified osteoarthritis, unspecified site: Secondary | ICD-10-CM | POA: Diagnosis not present

## 2020-12-07 DIAGNOSIS — Z86711 Personal history of pulmonary embolism: Secondary | ICD-10-CM | POA: Diagnosis not present

## 2020-12-07 DIAGNOSIS — D62 Acute posthemorrhagic anemia: Secondary | ICD-10-CM | POA: Diagnosis not present

## 2020-12-07 DIAGNOSIS — E871 Hypo-osmolality and hyponatremia: Secondary | ICD-10-CM | POA: Diagnosis not present

## 2020-12-08 DIAGNOSIS — R338 Other retention of urine: Secondary | ICD-10-CM | POA: Diagnosis not present

## 2020-12-08 DIAGNOSIS — B351 Tinea unguium: Secondary | ICD-10-CM | POA: Diagnosis not present

## 2020-12-08 DIAGNOSIS — I10 Essential (primary) hypertension: Secondary | ICD-10-CM | POA: Diagnosis not present

## 2020-12-08 DIAGNOSIS — D62 Acute posthemorrhagic anemia: Secondary | ICD-10-CM | POA: Diagnosis not present

## 2020-12-08 DIAGNOSIS — E785 Hyperlipidemia, unspecified: Secondary | ICD-10-CM | POA: Diagnosis not present

## 2020-12-08 DIAGNOSIS — Z86711 Personal history of pulmonary embolism: Secondary | ICD-10-CM | POA: Diagnosis not present

## 2020-12-08 DIAGNOSIS — Z7901 Long term (current) use of anticoagulants: Secondary | ICD-10-CM | POA: Diagnosis not present

## 2020-12-08 DIAGNOSIS — M1990 Unspecified osteoarthritis, unspecified site: Secondary | ICD-10-CM | POA: Diagnosis not present

## 2020-12-08 DIAGNOSIS — Z9181 History of falling: Secondary | ICD-10-CM | POA: Diagnosis not present

## 2020-12-08 DIAGNOSIS — I69354 Hemiplegia and hemiparesis following cerebral infarction affecting left non-dominant side: Secondary | ICD-10-CM | POA: Diagnosis not present

## 2020-12-08 DIAGNOSIS — E871 Hypo-osmolality and hyponatremia: Secondary | ICD-10-CM | POA: Diagnosis not present

## 2020-12-08 DIAGNOSIS — E041 Nontoxic single thyroid nodule: Secondary | ICD-10-CM | POA: Diagnosis not present

## 2020-12-08 DIAGNOSIS — G252 Other specified forms of tremor: Secondary | ICD-10-CM | POA: Diagnosis not present

## 2020-12-08 DIAGNOSIS — K5901 Slow transit constipation: Secondary | ICD-10-CM | POA: Diagnosis not present

## 2020-12-08 DIAGNOSIS — Z86718 Personal history of other venous thrombosis and embolism: Secondary | ICD-10-CM | POA: Diagnosis not present

## 2020-12-14 DIAGNOSIS — I69354 Hemiplegia and hemiparesis following cerebral infarction affecting left non-dominant side: Secondary | ICD-10-CM | POA: Diagnosis not present

## 2020-12-14 DIAGNOSIS — Z9181 History of falling: Secondary | ICD-10-CM | POA: Diagnosis not present

## 2020-12-14 DIAGNOSIS — G252 Other specified forms of tremor: Secondary | ICD-10-CM | POA: Diagnosis not present

## 2020-12-14 DIAGNOSIS — E785 Hyperlipidemia, unspecified: Secondary | ICD-10-CM | POA: Diagnosis not present

## 2020-12-14 DIAGNOSIS — I10 Essential (primary) hypertension: Secondary | ICD-10-CM | POA: Diagnosis not present

## 2020-12-14 DIAGNOSIS — Z7901 Long term (current) use of anticoagulants: Secondary | ICD-10-CM | POA: Diagnosis not present

## 2020-12-14 DIAGNOSIS — R338 Other retention of urine: Secondary | ICD-10-CM | POA: Diagnosis not present

## 2020-12-14 DIAGNOSIS — B351 Tinea unguium: Secondary | ICD-10-CM | POA: Diagnosis not present

## 2020-12-14 DIAGNOSIS — D62 Acute posthemorrhagic anemia: Secondary | ICD-10-CM | POA: Diagnosis not present

## 2020-12-14 DIAGNOSIS — M1990 Unspecified osteoarthritis, unspecified site: Secondary | ICD-10-CM | POA: Diagnosis not present

## 2020-12-14 DIAGNOSIS — Z86718 Personal history of other venous thrombosis and embolism: Secondary | ICD-10-CM | POA: Diagnosis not present

## 2020-12-14 DIAGNOSIS — Z86711 Personal history of pulmonary embolism: Secondary | ICD-10-CM | POA: Diagnosis not present

## 2020-12-14 DIAGNOSIS — E871 Hypo-osmolality and hyponatremia: Secondary | ICD-10-CM | POA: Diagnosis not present

## 2020-12-14 DIAGNOSIS — E041 Nontoxic single thyroid nodule: Secondary | ICD-10-CM | POA: Diagnosis not present

## 2020-12-14 DIAGNOSIS — K5901 Slow transit constipation: Secondary | ICD-10-CM | POA: Diagnosis not present

## 2020-12-16 DIAGNOSIS — H53469 Homonymous bilateral field defects, unspecified side: Secondary | ICD-10-CM | POA: Diagnosis not present

## 2020-12-16 DIAGNOSIS — Z961 Presence of intraocular lens: Secondary | ICD-10-CM | POA: Diagnosis not present

## 2020-12-16 DIAGNOSIS — H353132 Nonexudative age-related macular degeneration, bilateral, intermediate dry stage: Secondary | ICD-10-CM | POA: Diagnosis not present

## 2020-12-16 DIAGNOSIS — H524 Presbyopia: Secondary | ICD-10-CM | POA: Diagnosis not present

## 2020-12-17 DIAGNOSIS — E871 Hypo-osmolality and hyponatremia: Secondary | ICD-10-CM | POA: Diagnosis not present

## 2020-12-17 DIAGNOSIS — B351 Tinea unguium: Secondary | ICD-10-CM | POA: Diagnosis not present

## 2020-12-17 DIAGNOSIS — K5901 Slow transit constipation: Secondary | ICD-10-CM | POA: Diagnosis not present

## 2020-12-17 DIAGNOSIS — R338 Other retention of urine: Secondary | ICD-10-CM | POA: Diagnosis not present

## 2020-12-17 DIAGNOSIS — I69354 Hemiplegia and hemiparesis following cerebral infarction affecting left non-dominant side: Secondary | ICD-10-CM | POA: Diagnosis not present

## 2020-12-17 DIAGNOSIS — E785 Hyperlipidemia, unspecified: Secondary | ICD-10-CM | POA: Diagnosis not present

## 2020-12-17 DIAGNOSIS — Z9181 History of falling: Secondary | ICD-10-CM | POA: Diagnosis not present

## 2020-12-17 DIAGNOSIS — Z86718 Personal history of other venous thrombosis and embolism: Secondary | ICD-10-CM | POA: Diagnosis not present

## 2020-12-17 DIAGNOSIS — G252 Other specified forms of tremor: Secondary | ICD-10-CM | POA: Diagnosis not present

## 2020-12-17 DIAGNOSIS — E041 Nontoxic single thyroid nodule: Secondary | ICD-10-CM | POA: Diagnosis not present

## 2020-12-17 DIAGNOSIS — Z86711 Personal history of pulmonary embolism: Secondary | ICD-10-CM | POA: Diagnosis not present

## 2020-12-17 DIAGNOSIS — Z7901 Long term (current) use of anticoagulants: Secondary | ICD-10-CM | POA: Diagnosis not present

## 2020-12-17 DIAGNOSIS — D62 Acute posthemorrhagic anemia: Secondary | ICD-10-CM | POA: Diagnosis not present

## 2020-12-17 DIAGNOSIS — I10 Essential (primary) hypertension: Secondary | ICD-10-CM | POA: Diagnosis not present

## 2020-12-17 DIAGNOSIS — M1990 Unspecified osteoarthritis, unspecified site: Secondary | ICD-10-CM | POA: Diagnosis not present

## 2020-12-21 DIAGNOSIS — E871 Hypo-osmolality and hyponatremia: Secondary | ICD-10-CM | POA: Diagnosis not present

## 2020-12-21 DIAGNOSIS — Z9181 History of falling: Secondary | ICD-10-CM | POA: Diagnosis not present

## 2020-12-21 DIAGNOSIS — G252 Other specified forms of tremor: Secondary | ICD-10-CM | POA: Diagnosis not present

## 2020-12-21 DIAGNOSIS — B351 Tinea unguium: Secondary | ICD-10-CM | POA: Diagnosis not present

## 2020-12-21 DIAGNOSIS — K5901 Slow transit constipation: Secondary | ICD-10-CM | POA: Diagnosis not present

## 2020-12-21 DIAGNOSIS — I69354 Hemiplegia and hemiparesis following cerebral infarction affecting left non-dominant side: Secondary | ICD-10-CM | POA: Diagnosis not present

## 2020-12-21 DIAGNOSIS — E785 Hyperlipidemia, unspecified: Secondary | ICD-10-CM | POA: Diagnosis not present

## 2020-12-21 DIAGNOSIS — Z7901 Long term (current) use of anticoagulants: Secondary | ICD-10-CM | POA: Diagnosis not present

## 2020-12-21 DIAGNOSIS — E041 Nontoxic single thyroid nodule: Secondary | ICD-10-CM | POA: Diagnosis not present

## 2020-12-21 DIAGNOSIS — Z86718 Personal history of other venous thrombosis and embolism: Secondary | ICD-10-CM | POA: Diagnosis not present

## 2020-12-21 DIAGNOSIS — I10 Essential (primary) hypertension: Secondary | ICD-10-CM | POA: Diagnosis not present

## 2020-12-21 DIAGNOSIS — M1990 Unspecified osteoarthritis, unspecified site: Secondary | ICD-10-CM | POA: Diagnosis not present

## 2020-12-21 DIAGNOSIS — R338 Other retention of urine: Secondary | ICD-10-CM | POA: Diagnosis not present

## 2020-12-21 DIAGNOSIS — D62 Acute posthemorrhagic anemia: Secondary | ICD-10-CM | POA: Diagnosis not present

## 2020-12-21 DIAGNOSIS — Z86711 Personal history of pulmonary embolism: Secondary | ICD-10-CM | POA: Diagnosis not present

## 2020-12-23 DIAGNOSIS — K029 Dental caries, unspecified: Secondary | ICD-10-CM | POA: Diagnosis not present

## 2020-12-23 DIAGNOSIS — Z8673 Personal history of transient ischemic attack (TIA), and cerebral infarction without residual deficits: Secondary | ICD-10-CM | POA: Diagnosis not present

## 2020-12-23 DIAGNOSIS — L089 Local infection of the skin and subcutaneous tissue, unspecified: Secondary | ICD-10-CM | POA: Diagnosis not present

## 2020-12-23 DIAGNOSIS — K047 Periapical abscess without sinus: Secondary | ICD-10-CM | POA: Diagnosis not present

## 2020-12-24 DIAGNOSIS — B351 Tinea unguium: Secondary | ICD-10-CM | POA: Diagnosis not present

## 2020-12-24 DIAGNOSIS — G252 Other specified forms of tremor: Secondary | ICD-10-CM | POA: Diagnosis not present

## 2020-12-24 DIAGNOSIS — Z86711 Personal history of pulmonary embolism: Secondary | ICD-10-CM | POA: Diagnosis not present

## 2020-12-24 DIAGNOSIS — E871 Hypo-osmolality and hyponatremia: Secondary | ICD-10-CM | POA: Diagnosis not present

## 2020-12-24 DIAGNOSIS — I69354 Hemiplegia and hemiparesis following cerebral infarction affecting left non-dominant side: Secondary | ICD-10-CM | POA: Diagnosis not present

## 2020-12-24 DIAGNOSIS — D62 Acute posthemorrhagic anemia: Secondary | ICD-10-CM | POA: Diagnosis not present

## 2020-12-24 DIAGNOSIS — Z86718 Personal history of other venous thrombosis and embolism: Secondary | ICD-10-CM | POA: Diagnosis not present

## 2020-12-24 DIAGNOSIS — I10 Essential (primary) hypertension: Secondary | ICD-10-CM | POA: Diagnosis not present

## 2020-12-24 DIAGNOSIS — E785 Hyperlipidemia, unspecified: Secondary | ICD-10-CM | POA: Diagnosis not present

## 2020-12-24 DIAGNOSIS — M1990 Unspecified osteoarthritis, unspecified site: Secondary | ICD-10-CM | POA: Diagnosis not present

## 2020-12-24 DIAGNOSIS — R338 Other retention of urine: Secondary | ICD-10-CM | POA: Diagnosis not present

## 2020-12-24 DIAGNOSIS — Z7901 Long term (current) use of anticoagulants: Secondary | ICD-10-CM | POA: Diagnosis not present

## 2020-12-24 DIAGNOSIS — E041 Nontoxic single thyroid nodule: Secondary | ICD-10-CM | POA: Diagnosis not present

## 2020-12-24 DIAGNOSIS — Z9181 History of falling: Secondary | ICD-10-CM | POA: Diagnosis not present

## 2020-12-24 DIAGNOSIS — K5901 Slow transit constipation: Secondary | ICD-10-CM | POA: Diagnosis not present

## 2020-12-25 DIAGNOSIS — Z886 Allergy status to analgesic agent status: Secondary | ICD-10-CM | POA: Diagnosis not present

## 2020-12-25 DIAGNOSIS — Z880 Allergy status to penicillin: Secondary | ICD-10-CM | POA: Diagnosis not present

## 2020-12-25 DIAGNOSIS — Z743 Need for continuous supervision: Secondary | ICD-10-CM | POA: Diagnosis not present

## 2020-12-25 DIAGNOSIS — R600 Localized edema: Secondary | ICD-10-CM | POA: Diagnosis not present

## 2020-12-25 DIAGNOSIS — B029 Zoster without complications: Secondary | ICD-10-CM | POA: Diagnosis not present

## 2020-12-25 DIAGNOSIS — K047 Periapical abscess without sinus: Secondary | ICD-10-CM | POA: Diagnosis not present

## 2020-12-31 DIAGNOSIS — Z86711 Personal history of pulmonary embolism: Secondary | ICD-10-CM | POA: Diagnosis not present

## 2020-12-31 DIAGNOSIS — I10 Essential (primary) hypertension: Secondary | ICD-10-CM | POA: Diagnosis not present

## 2020-12-31 DIAGNOSIS — E871 Hypo-osmolality and hyponatremia: Secondary | ICD-10-CM | POA: Diagnosis not present

## 2020-12-31 DIAGNOSIS — R338 Other retention of urine: Secondary | ICD-10-CM | POA: Diagnosis not present

## 2020-12-31 DIAGNOSIS — G252 Other specified forms of tremor: Secondary | ICD-10-CM | POA: Diagnosis not present

## 2020-12-31 DIAGNOSIS — B351 Tinea unguium: Secondary | ICD-10-CM | POA: Diagnosis not present

## 2020-12-31 DIAGNOSIS — E041 Nontoxic single thyroid nodule: Secondary | ICD-10-CM | POA: Diagnosis not present

## 2020-12-31 DIAGNOSIS — M1990 Unspecified osteoarthritis, unspecified site: Secondary | ICD-10-CM | POA: Diagnosis not present

## 2020-12-31 DIAGNOSIS — Z7901 Long term (current) use of anticoagulants: Secondary | ICD-10-CM | POA: Diagnosis not present

## 2020-12-31 DIAGNOSIS — I69354 Hemiplegia and hemiparesis following cerebral infarction affecting left non-dominant side: Secondary | ICD-10-CM | POA: Diagnosis not present

## 2020-12-31 DIAGNOSIS — Z86718 Personal history of other venous thrombosis and embolism: Secondary | ICD-10-CM | POA: Diagnosis not present

## 2020-12-31 DIAGNOSIS — K5901 Slow transit constipation: Secondary | ICD-10-CM | POA: Diagnosis not present

## 2020-12-31 DIAGNOSIS — E785 Hyperlipidemia, unspecified: Secondary | ICD-10-CM | POA: Diagnosis not present

## 2020-12-31 DIAGNOSIS — D62 Acute posthemorrhagic anemia: Secondary | ICD-10-CM | POA: Diagnosis not present

## 2020-12-31 DIAGNOSIS — Z9181 History of falling: Secondary | ICD-10-CM | POA: Diagnosis not present

## 2021-01-07 DIAGNOSIS — Z9181 History of falling: Secondary | ICD-10-CM | POA: Diagnosis not present

## 2021-01-07 DIAGNOSIS — I69354 Hemiplegia and hemiparesis following cerebral infarction affecting left non-dominant side: Secondary | ICD-10-CM | POA: Diagnosis not present

## 2021-01-07 DIAGNOSIS — E041 Nontoxic single thyroid nodule: Secondary | ICD-10-CM | POA: Diagnosis not present

## 2021-01-07 DIAGNOSIS — Z7901 Long term (current) use of anticoagulants: Secondary | ICD-10-CM | POA: Diagnosis not present

## 2021-01-07 DIAGNOSIS — B351 Tinea unguium: Secondary | ICD-10-CM | POA: Diagnosis not present

## 2021-01-07 DIAGNOSIS — Z86718 Personal history of other venous thrombosis and embolism: Secondary | ICD-10-CM | POA: Diagnosis not present

## 2021-01-07 DIAGNOSIS — R338 Other retention of urine: Secondary | ICD-10-CM | POA: Diagnosis not present

## 2021-01-07 DIAGNOSIS — R079 Chest pain, unspecified: Secondary | ICD-10-CM | POA: Diagnosis not present

## 2021-01-07 DIAGNOSIS — I10 Essential (primary) hypertension: Secondary | ICD-10-CM | POA: Diagnosis not present

## 2021-01-07 DIAGNOSIS — E785 Hyperlipidemia, unspecified: Secondary | ICD-10-CM | POA: Diagnosis not present

## 2021-01-07 DIAGNOSIS — R1030 Lower abdominal pain, unspecified: Secondary | ICD-10-CM | POA: Diagnosis not present

## 2021-01-07 DIAGNOSIS — R109 Unspecified abdominal pain: Secondary | ICD-10-CM | POA: Diagnosis not present

## 2021-01-07 DIAGNOSIS — D62 Acute posthemorrhagic anemia: Secondary | ICD-10-CM | POA: Diagnosis not present

## 2021-01-07 DIAGNOSIS — M1990 Unspecified osteoarthritis, unspecified site: Secondary | ICD-10-CM | POA: Diagnosis not present

## 2021-01-07 DIAGNOSIS — N390 Urinary tract infection, site not specified: Secondary | ICD-10-CM | POA: Diagnosis not present

## 2021-01-07 DIAGNOSIS — K8020 Calculus of gallbladder without cholecystitis without obstruction: Secondary | ICD-10-CM | POA: Diagnosis not present

## 2021-01-07 DIAGNOSIS — L03116 Cellulitis of left lower limb: Secondary | ICD-10-CM | POA: Diagnosis not present

## 2021-01-07 DIAGNOSIS — G252 Other specified forms of tremor: Secondary | ICD-10-CM | POA: Diagnosis not present

## 2021-01-07 DIAGNOSIS — E871 Hypo-osmolality and hyponatremia: Secondary | ICD-10-CM | POA: Diagnosis not present

## 2021-01-07 DIAGNOSIS — L03115 Cellulitis of right lower limb: Secondary | ICD-10-CM | POA: Diagnosis not present

## 2021-01-07 DIAGNOSIS — R1084 Generalized abdominal pain: Secondary | ICD-10-CM | POA: Diagnosis not present

## 2021-01-07 DIAGNOSIS — K5901 Slow transit constipation: Secondary | ICD-10-CM | POA: Diagnosis not present

## 2021-01-07 DIAGNOSIS — Z86711 Personal history of pulmonary embolism: Secondary | ICD-10-CM | POA: Diagnosis not present

## 2021-01-07 DIAGNOSIS — Z79899 Other long term (current) drug therapy: Secondary | ICD-10-CM | POA: Diagnosis not present

## 2021-01-09 DIAGNOSIS — R8281 Pyuria: Secondary | ICD-10-CM | POA: Diagnosis not present

## 2021-01-09 DIAGNOSIS — R1084 Generalized abdominal pain: Secondary | ICD-10-CM | POA: Diagnosis not present

## 2021-01-09 DIAGNOSIS — R079 Chest pain, unspecified: Secondary | ICD-10-CM | POA: Diagnosis not present

## 2021-01-09 DIAGNOSIS — Z743 Need for continuous supervision: Secondary | ICD-10-CM | POA: Diagnosis not present

## 2021-01-09 DIAGNOSIS — N390 Urinary tract infection, site not specified: Secondary | ICD-10-CM | POA: Diagnosis not present

## 2021-01-09 DIAGNOSIS — N3001 Acute cystitis with hematuria: Secondary | ICD-10-CM | POA: Diagnosis not present

## 2021-01-09 DIAGNOSIS — Z79899 Other long term (current) drug therapy: Secondary | ICD-10-CM | POA: Diagnosis not present

## 2021-01-09 DIAGNOSIS — I82442 Acute embolism and thrombosis of left tibial vein: Secondary | ICD-10-CM | POA: Diagnosis not present

## 2021-01-09 DIAGNOSIS — I82403 Acute embolism and thrombosis of unspecified deep veins of lower extremity, bilateral: Secondary | ICD-10-CM | POA: Diagnosis not present

## 2021-01-09 DIAGNOSIS — Z8673 Personal history of transient ischemic attack (TIA), and cerebral infarction without residual deficits: Secondary | ICD-10-CM | POA: Diagnosis not present

## 2021-01-09 DIAGNOSIS — Z880 Allergy status to penicillin: Secondary | ICD-10-CM | POA: Diagnosis not present

## 2021-01-09 DIAGNOSIS — M7989 Other specified soft tissue disorders: Secondary | ICD-10-CM | POA: Diagnosis not present

## 2021-01-09 DIAGNOSIS — Z886 Allergy status to analgesic agent status: Secondary | ICD-10-CM | POA: Diagnosis not present

## 2021-01-09 DIAGNOSIS — R531 Weakness: Secondary | ICD-10-CM | POA: Diagnosis not present

## 2021-01-09 DIAGNOSIS — M79605 Pain in left leg: Secondary | ICD-10-CM | POA: Diagnosis not present

## 2021-01-09 DIAGNOSIS — Z7901 Long term (current) use of anticoagulants: Secondary | ICD-10-CM | POA: Diagnosis not present

## 2021-01-09 DIAGNOSIS — I82432 Acute embolism and thrombosis of left popliteal vein: Secondary | ICD-10-CM | POA: Diagnosis not present

## 2021-01-09 DIAGNOSIS — M79604 Pain in right leg: Secondary | ICD-10-CM | POA: Diagnosis not present

## 2021-01-09 DIAGNOSIS — R519 Headache, unspecified: Secondary | ICD-10-CM | POA: Diagnosis not present

## 2021-01-10 DIAGNOSIS — R338 Other retention of urine: Secondary | ICD-10-CM | POA: Diagnosis not present

## 2021-01-10 DIAGNOSIS — Z8673 Personal history of transient ischemic attack (TIA), and cerebral infarction without residual deficits: Secondary | ICD-10-CM | POA: Diagnosis not present

## 2021-01-10 DIAGNOSIS — I82432 Acute embolism and thrombosis of left popliteal vein: Secondary | ICD-10-CM | POA: Diagnosis not present

## 2021-01-10 DIAGNOSIS — B351 Tinea unguium: Secondary | ICD-10-CM | POA: Diagnosis not present

## 2021-01-10 DIAGNOSIS — I10 Essential (primary) hypertension: Secondary | ICD-10-CM | POA: Diagnosis not present

## 2021-01-10 DIAGNOSIS — D62 Acute posthemorrhagic anemia: Secondary | ICD-10-CM | POA: Diagnosis not present

## 2021-01-10 DIAGNOSIS — E041 Nontoxic single thyroid nodule: Secondary | ICD-10-CM | POA: Diagnosis not present

## 2021-01-10 DIAGNOSIS — N3000 Acute cystitis without hematuria: Secondary | ICD-10-CM | POA: Diagnosis not present

## 2021-01-10 DIAGNOSIS — M1990 Unspecified osteoarthritis, unspecified site: Secondary | ICD-10-CM | POA: Diagnosis not present

## 2021-01-10 DIAGNOSIS — E785 Hyperlipidemia, unspecified: Secondary | ICD-10-CM | POA: Diagnosis not present

## 2021-01-10 DIAGNOSIS — G252 Other specified forms of tremor: Secondary | ICD-10-CM | POA: Diagnosis not present

## 2021-01-10 DIAGNOSIS — E871 Hypo-osmolality and hyponatremia: Secondary | ICD-10-CM | POA: Diagnosis not present

## 2021-01-10 DIAGNOSIS — R2243 Localized swelling, mass and lump, lower limb, bilateral: Secondary | ICD-10-CM | POA: Diagnosis not present

## 2021-01-10 DIAGNOSIS — Z7901 Long term (current) use of anticoagulants: Secondary | ICD-10-CM | POA: Diagnosis not present

## 2021-01-10 DIAGNOSIS — I69354 Hemiplegia and hemiparesis following cerebral infarction affecting left non-dominant side: Secondary | ICD-10-CM | POA: Diagnosis not present

## 2021-01-10 DIAGNOSIS — R531 Weakness: Secondary | ICD-10-CM | POA: Diagnosis not present

## 2021-01-10 DIAGNOSIS — I82442 Acute embolism and thrombosis of left tibial vein: Secondary | ICD-10-CM | POA: Diagnosis not present

## 2021-01-10 DIAGNOSIS — K5901 Slow transit constipation: Secondary | ICD-10-CM | POA: Diagnosis not present

## 2021-01-11 DIAGNOSIS — I82442 Acute embolism and thrombosis of left tibial vein: Secondary | ICD-10-CM | POA: Diagnosis not present

## 2021-01-11 DIAGNOSIS — R2243 Localized swelling, mass and lump, lower limb, bilateral: Secondary | ICD-10-CM | POA: Diagnosis not present

## 2021-01-11 DIAGNOSIS — Z7901 Long term (current) use of anticoagulants: Secondary | ICD-10-CM | POA: Diagnosis not present

## 2021-01-11 DIAGNOSIS — I82432 Acute embolism and thrombosis of left popliteal vein: Secondary | ICD-10-CM | POA: Diagnosis not present

## 2021-01-11 DIAGNOSIS — Z8673 Personal history of transient ischemic attack (TIA), and cerebral infarction without residual deficits: Secondary | ICD-10-CM | POA: Diagnosis not present

## 2021-01-11 DIAGNOSIS — R531 Weakness: Secondary | ICD-10-CM | POA: Diagnosis not present

## 2021-01-11 DIAGNOSIS — N3000 Acute cystitis without hematuria: Secondary | ICD-10-CM | POA: Diagnosis not present

## 2021-01-12 ENCOUNTER — Ambulatory Visit: Payer: Medicare Other | Admitting: Adult Health

## 2021-01-12 DIAGNOSIS — I82442 Acute embolism and thrombosis of left tibial vein: Secondary | ICD-10-CM | POA: Diagnosis not present

## 2021-01-12 DIAGNOSIS — R8281 Pyuria: Secondary | ICD-10-CM | POA: Diagnosis not present

## 2021-01-12 DIAGNOSIS — I82439 Acute embolism and thrombosis of unspecified popliteal vein: Secondary | ICD-10-CM | POA: Diagnosis not present

## 2021-01-12 DIAGNOSIS — N3000 Acute cystitis without hematuria: Secondary | ICD-10-CM | POA: Diagnosis not present

## 2021-01-12 DIAGNOSIS — R531 Weakness: Secondary | ICD-10-CM | POA: Diagnosis not present

## 2021-01-12 DIAGNOSIS — Z7901 Long term (current) use of anticoagulants: Secondary | ICD-10-CM | POA: Diagnosis not present

## 2021-01-13 DIAGNOSIS — R531 Weakness: Secondary | ICD-10-CM | POA: Diagnosis not present

## 2021-01-13 DIAGNOSIS — Z7901 Long term (current) use of anticoagulants: Secondary | ICD-10-CM | POA: Diagnosis not present

## 2021-01-13 DIAGNOSIS — I82439 Acute embolism and thrombosis of unspecified popliteal vein: Secondary | ICD-10-CM | POA: Diagnosis not present

## 2021-01-13 DIAGNOSIS — R8281 Pyuria: Secondary | ICD-10-CM | POA: Diagnosis not present

## 2021-01-13 DIAGNOSIS — N3000 Acute cystitis without hematuria: Secondary | ICD-10-CM | POA: Diagnosis not present

## 2021-01-13 DIAGNOSIS — I82442 Acute embolism and thrombosis of left tibial vein: Secondary | ICD-10-CM | POA: Diagnosis not present

## 2021-01-14 DIAGNOSIS — M7989 Other specified soft tissue disorders: Secondary | ICD-10-CM | POA: Diagnosis not present

## 2021-01-14 DIAGNOSIS — M79676 Pain in unspecified toe(s): Secondary | ICD-10-CM | POA: Diagnosis not present

## 2021-01-14 DIAGNOSIS — I82432 Acute embolism and thrombosis of left popliteal vein: Secondary | ICD-10-CM | POA: Diagnosis not present

## 2021-01-14 DIAGNOSIS — I10 Essential (primary) hypertension: Secondary | ICD-10-CM | POA: Diagnosis not present

## 2021-01-14 DIAGNOSIS — Z741 Need for assistance with personal care: Secondary | ICD-10-CM | POA: Diagnosis not present

## 2021-01-14 DIAGNOSIS — R531 Weakness: Secondary | ICD-10-CM | POA: Diagnosis not present

## 2021-01-14 DIAGNOSIS — N390 Urinary tract infection, site not specified: Secondary | ICD-10-CM | POA: Diagnosis not present

## 2021-01-14 DIAGNOSIS — Z880 Allergy status to penicillin: Secondary | ICD-10-CM | POA: Diagnosis not present

## 2021-01-14 DIAGNOSIS — N3001 Acute cystitis with hematuria: Secondary | ICD-10-CM | POA: Diagnosis not present

## 2021-01-14 DIAGNOSIS — I82449 Acute embolism and thrombosis of unspecified tibial vein: Secondary | ICD-10-CM | POA: Diagnosis not present

## 2021-01-14 DIAGNOSIS — E785 Hyperlipidemia, unspecified: Secondary | ICD-10-CM | POA: Diagnosis not present

## 2021-01-14 DIAGNOSIS — Z7901 Long term (current) use of anticoagulants: Secondary | ICD-10-CM | POA: Diagnosis not present

## 2021-01-14 DIAGNOSIS — Z743 Need for continuous supervision: Secondary | ICD-10-CM | POA: Diagnosis not present

## 2021-01-14 DIAGNOSIS — Z886 Allergy status to analgesic agent status: Secondary | ICD-10-CM | POA: Diagnosis not present

## 2021-01-14 DIAGNOSIS — R8290 Unspecified abnormal findings in urine: Secondary | ICD-10-CM | POA: Diagnosis not present

## 2021-01-14 DIAGNOSIS — R519 Headache, unspecified: Secondary | ICD-10-CM | POA: Diagnosis not present

## 2021-01-14 DIAGNOSIS — I82402 Acute embolism and thrombosis of unspecified deep veins of left lower extremity: Secondary | ICD-10-CM | POA: Diagnosis not present

## 2021-01-14 DIAGNOSIS — I82442 Acute embolism and thrombosis of left tibial vein: Secondary | ICD-10-CM | POA: Diagnosis not present

## 2021-01-14 DIAGNOSIS — R8281 Pyuria: Secondary | ICD-10-CM | POA: Diagnosis not present

## 2021-01-14 DIAGNOSIS — R262 Difficulty in walking, not elsewhere classified: Secondary | ICD-10-CM | POA: Diagnosis not present

## 2021-01-14 DIAGNOSIS — Z8673 Personal history of transient ischemic attack (TIA), and cerebral infarction without residual deficits: Secondary | ICD-10-CM | POA: Diagnosis not present

## 2021-01-14 DIAGNOSIS — I82439 Acute embolism and thrombosis of unspecified popliteal vein: Secondary | ICD-10-CM | POA: Diagnosis not present

## 2021-01-14 DIAGNOSIS — Z79899 Other long term (current) drug therapy: Secondary | ICD-10-CM | POA: Diagnosis not present

## 2021-01-14 DIAGNOSIS — Z66 Do not resuscitate: Secondary | ICD-10-CM | POA: Diagnosis not present

## 2021-01-17 DIAGNOSIS — I82402 Acute embolism and thrombosis of unspecified deep veins of left lower extremity: Secondary | ICD-10-CM | POA: Diagnosis not present

## 2021-01-19 DIAGNOSIS — M79676 Pain in unspecified toe(s): Secondary | ICD-10-CM | POA: Diagnosis not present

## 2021-01-20 DIAGNOSIS — I82402 Acute embolism and thrombosis of unspecified deep veins of left lower extremity: Secondary | ICD-10-CM | POA: Diagnosis not present

## 2021-01-26 DIAGNOSIS — R531 Weakness: Secondary | ICD-10-CM | POA: Diagnosis not present

## 2021-01-27 DIAGNOSIS — I10 Essential (primary) hypertension: Secondary | ICD-10-CM | POA: Diagnosis not present

## 2021-01-28 DIAGNOSIS — I82402 Acute embolism and thrombosis of unspecified deep veins of left lower extremity: Secondary | ICD-10-CM | POA: Diagnosis not present

## 2021-01-29 DIAGNOSIS — Z20822 Contact with and (suspected) exposure to covid-19: Secondary | ICD-10-CM | POA: Diagnosis not present

## 2021-01-29 DIAGNOSIS — I639 Cerebral infarction, unspecified: Secondary | ICD-10-CM | POA: Diagnosis not present

## 2021-01-29 DIAGNOSIS — I6523 Occlusion and stenosis of bilateral carotid arteries: Secondary | ICD-10-CM | POA: Diagnosis not present

## 2021-01-29 DIAGNOSIS — Z66 Do not resuscitate: Secondary | ICD-10-CM | POA: Diagnosis not present

## 2021-01-29 DIAGNOSIS — F03918 Unspecified dementia, unspecified severity, with other behavioral disturbance: Secondary | ICD-10-CM | POA: Diagnosis not present

## 2021-01-29 DIAGNOSIS — G9340 Encephalopathy, unspecified: Secondary | ICD-10-CM | POA: Diagnosis not present

## 2021-01-29 DIAGNOSIS — F01518 Vascular dementia, unspecified severity, with other behavioral disturbance: Secondary | ICD-10-CM | POA: Diagnosis not present

## 2021-01-29 DIAGNOSIS — B351 Tinea unguium: Secondary | ICD-10-CM | POA: Diagnosis not present

## 2021-01-29 DIAGNOSIS — F0671 Mild neurocognitive disorder due to known physiological condition with behavioral disturbance: Secondary | ICD-10-CM | POA: Diagnosis not present

## 2021-01-29 DIAGNOSIS — Z79899 Other long term (current) drug therapy: Secondary | ICD-10-CM | POA: Diagnosis not present

## 2021-01-29 DIAGNOSIS — G9341 Metabolic encephalopathy: Secondary | ICD-10-CM | POA: Diagnosis not present

## 2021-01-29 DIAGNOSIS — E785 Hyperlipidemia, unspecified: Secondary | ICD-10-CM | POA: Diagnosis not present

## 2021-01-29 DIAGNOSIS — I10 Essential (primary) hypertension: Secondary | ICD-10-CM | POA: Diagnosis not present

## 2021-01-29 DIAGNOSIS — Z87440 Personal history of urinary (tract) infections: Secondary | ICD-10-CM | POA: Diagnosis not present

## 2021-01-29 DIAGNOSIS — Z634 Disappearance and death of family member: Secondary | ICD-10-CM | POA: Diagnosis not present

## 2021-01-29 DIAGNOSIS — Z89421 Acquired absence of other right toe(s): Secondary | ICD-10-CM | POA: Diagnosis not present

## 2021-01-29 DIAGNOSIS — H547 Unspecified visual loss: Secondary | ICD-10-CM | POA: Diagnosis not present

## 2021-01-29 DIAGNOSIS — I361 Nonrheumatic tricuspid (valve) insufficiency: Secondary | ICD-10-CM | POA: Diagnosis not present

## 2021-01-29 DIAGNOSIS — Z86718 Personal history of other venous thrombosis and embolism: Secondary | ICD-10-CM | POA: Diagnosis not present

## 2021-01-29 DIAGNOSIS — L84 Corns and callosities: Secondary | ICD-10-CM | POA: Diagnosis not present

## 2021-01-29 DIAGNOSIS — I69398 Other sequelae of cerebral infarction: Secondary | ICD-10-CM | POA: Diagnosis not present

## 2021-01-29 DIAGNOSIS — Z886 Allergy status to analgesic agent status: Secondary | ICD-10-CM | POA: Diagnosis not present

## 2021-01-29 DIAGNOSIS — Z880 Allergy status to penicillin: Secondary | ICD-10-CM | POA: Diagnosis not present

## 2021-01-29 DIAGNOSIS — R451 Restlessness and agitation: Secondary | ICD-10-CM | POA: Diagnosis not present

## 2021-01-29 DIAGNOSIS — N3000 Acute cystitis without hematuria: Secondary | ICD-10-CM | POA: Diagnosis not present

## 2021-01-29 DIAGNOSIS — I82432 Acute embolism and thrombosis of left popliteal vein: Secondary | ICD-10-CM | POA: Diagnosis not present

## 2021-01-29 DIAGNOSIS — R9431 Abnormal electrocardiogram [ECG] [EKG]: Secondary | ICD-10-CM | POA: Diagnosis not present

## 2021-01-29 DIAGNOSIS — Z8673 Personal history of transient ischemic attack (TIA), and cerebral infarction without residual deficits: Secondary | ICD-10-CM | POA: Diagnosis not present

## 2021-01-29 DIAGNOSIS — Z7901 Long term (current) use of anticoagulants: Secondary | ICD-10-CM | POA: Diagnosis not present

## 2021-01-29 DIAGNOSIS — R930 Abnormal findings on diagnostic imaging of skull and head, not elsewhere classified: Secondary | ICD-10-CM | POA: Diagnosis not present

## 2021-01-30 DIAGNOSIS — Z8673 Personal history of transient ischemic attack (TIA), and cerebral infarction without residual deficits: Secondary | ICD-10-CM | POA: Diagnosis not present

## 2021-01-30 DIAGNOSIS — Z87440 Personal history of urinary (tract) infections: Secondary | ICD-10-CM | POA: Diagnosis not present

## 2021-01-30 DIAGNOSIS — Z86718 Personal history of other venous thrombosis and embolism: Secondary | ICD-10-CM | POA: Diagnosis not present

## 2021-01-30 DIAGNOSIS — I361 Nonrheumatic tricuspid (valve) insufficiency: Secondary | ICD-10-CM | POA: Diagnosis not present

## 2021-01-30 DIAGNOSIS — R451 Restlessness and agitation: Secondary | ICD-10-CM | POA: Diagnosis not present

## 2021-01-30 DIAGNOSIS — I639 Cerebral infarction, unspecified: Secondary | ICD-10-CM | POA: Diagnosis not present

## 2021-01-30 DIAGNOSIS — I6523 Occlusion and stenosis of bilateral carotid arteries: Secondary | ICD-10-CM | POA: Diagnosis not present

## 2021-01-30 DIAGNOSIS — R930 Abnormal findings on diagnostic imaging of skull and head, not elsewhere classified: Secondary | ICD-10-CM | POA: Diagnosis not present

## 2021-01-30 DIAGNOSIS — Z7901 Long term (current) use of anticoagulants: Secondary | ICD-10-CM | POA: Diagnosis not present

## 2021-01-31 DIAGNOSIS — Z86718 Personal history of other venous thrombosis and embolism: Secondary | ICD-10-CM | POA: Diagnosis not present

## 2021-01-31 DIAGNOSIS — R451 Restlessness and agitation: Secondary | ICD-10-CM | POA: Diagnosis not present

## 2021-01-31 DIAGNOSIS — I639 Cerebral infarction, unspecified: Secondary | ICD-10-CM | POA: Diagnosis not present

## 2021-02-01 DIAGNOSIS — R451 Restlessness and agitation: Secondary | ICD-10-CM | POA: Diagnosis not present

## 2021-02-01 DIAGNOSIS — B351 Tinea unguium: Secondary | ICD-10-CM | POA: Diagnosis not present

## 2021-02-01 DIAGNOSIS — Z89421 Acquired absence of other right toe(s): Secondary | ICD-10-CM | POA: Diagnosis not present

## 2021-02-01 DIAGNOSIS — Z86718 Personal history of other venous thrombosis and embolism: Secondary | ICD-10-CM | POA: Diagnosis not present

## 2021-02-01 DIAGNOSIS — I639 Cerebral infarction, unspecified: Secondary | ICD-10-CM | POA: Diagnosis not present

## 2021-02-01 DIAGNOSIS — L84 Corns and callosities: Secondary | ICD-10-CM | POA: Diagnosis not present

## 2021-02-02 DIAGNOSIS — Z8673 Personal history of transient ischemic attack (TIA), and cerebral infarction without residual deficits: Secondary | ICD-10-CM | POA: Diagnosis not present

## 2021-02-02 DIAGNOSIS — I10 Essential (primary) hypertension: Secondary | ICD-10-CM | POA: Diagnosis not present

## 2021-02-02 DIAGNOSIS — I82432 Acute embolism and thrombosis of left popliteal vein: Secondary | ICD-10-CM | POA: Diagnosis not present

## 2021-02-03 DIAGNOSIS — E860 Dehydration: Secondary | ICD-10-CM | POA: Diagnosis not present

## 2021-02-03 DIAGNOSIS — Z66 Do not resuscitate: Secondary | ICD-10-CM | POA: Diagnosis not present

## 2021-02-03 DIAGNOSIS — Z7401 Bed confinement status: Secondary | ICD-10-CM | POA: Diagnosis not present

## 2021-02-03 DIAGNOSIS — S3993XA Unspecified injury of pelvis, initial encounter: Secondary | ICD-10-CM | POA: Diagnosis not present

## 2021-02-03 DIAGNOSIS — I82432 Acute embolism and thrombosis of left popliteal vein: Secondary | ICD-10-CM | POA: Diagnosis not present

## 2021-02-03 DIAGNOSIS — M2560 Stiffness of unspecified joint, not elsewhere classified: Secondary | ICD-10-CM | POA: Diagnosis not present

## 2021-02-03 DIAGNOSIS — Z743 Need for continuous supervision: Secondary | ICD-10-CM | POA: Diagnosis not present

## 2021-02-03 DIAGNOSIS — R9401 Abnormal electroencephalogram [EEG]: Secondary | ICD-10-CM | POA: Diagnosis not present

## 2021-02-03 DIAGNOSIS — Z7689 Persons encountering health services in other specified circumstances: Secondary | ICD-10-CM | POA: Diagnosis not present

## 2021-02-03 DIAGNOSIS — Z7901 Long term (current) use of anticoagulants: Secondary | ICD-10-CM | POA: Diagnosis not present

## 2021-02-03 DIAGNOSIS — R29704 NIHSS score 4: Secondary | ICD-10-CM | POA: Diagnosis not present

## 2021-02-03 DIAGNOSIS — Z86711 Personal history of pulmonary embolism: Secondary | ICD-10-CM | POA: Diagnosis not present

## 2021-02-03 DIAGNOSIS — F0282 Dementia in other diseases classified elsewhere, unspecified severity, with psychotic disturbance: Secondary | ICD-10-CM | POA: Diagnosis not present

## 2021-02-03 DIAGNOSIS — G20 Parkinson's disease: Secondary | ICD-10-CM | POA: Diagnosis not present

## 2021-02-03 DIAGNOSIS — E440 Moderate protein-calorie malnutrition: Secondary | ICD-10-CM | POA: Diagnosis not present

## 2021-02-03 DIAGNOSIS — Z8673 Personal history of transient ischemic attack (TIA), and cerebral infarction without residual deficits: Secondary | ICD-10-CM | POA: Diagnosis not present

## 2021-02-03 DIAGNOSIS — G9341 Metabolic encephalopathy: Secondary | ICD-10-CM | POA: Diagnosis not present

## 2021-02-03 DIAGNOSIS — E785 Hyperlipidemia, unspecified: Secondary | ICD-10-CM | POA: Diagnosis not present

## 2021-02-03 DIAGNOSIS — S0990XA Unspecified injury of head, initial encounter: Secondary | ICD-10-CM | POA: Diagnosis not present

## 2021-02-03 DIAGNOSIS — I639 Cerebral infarction, unspecified: Secondary | ICD-10-CM | POA: Diagnosis not present

## 2021-02-03 DIAGNOSIS — R5381 Other malaise: Secondary | ICD-10-CM | POA: Diagnosis not present

## 2021-02-03 DIAGNOSIS — Z87440 Personal history of urinary (tract) infections: Secondary | ICD-10-CM | POA: Diagnosis not present

## 2021-02-03 DIAGNOSIS — M79605 Pain in left leg: Secondary | ICD-10-CM | POA: Diagnosis not present

## 2021-02-03 DIAGNOSIS — I10 Essential (primary) hypertension: Secondary | ICD-10-CM | POA: Diagnosis not present

## 2021-02-03 DIAGNOSIS — M47814 Spondylosis without myelopathy or radiculopathy, thoracic region: Secondary | ICD-10-CM | POA: Diagnosis not present

## 2021-02-03 DIAGNOSIS — W19XXXA Unspecified fall, initial encounter: Secondary | ICD-10-CM | POA: Diagnosis not present

## 2021-02-03 DIAGNOSIS — T148XXA Other injury of unspecified body region, initial encounter: Secondary | ICD-10-CM | POA: Diagnosis not present

## 2021-02-03 DIAGNOSIS — S299XXA Unspecified injury of thorax, initial encounter: Secondary | ICD-10-CM | POA: Diagnosis not present

## 2021-02-03 DIAGNOSIS — Z86718 Personal history of other venous thrombosis and embolism: Secondary | ICD-10-CM | POA: Diagnosis not present

## 2021-02-03 DIAGNOSIS — R451 Restlessness and agitation: Secondary | ICD-10-CM | POA: Diagnosis not present

## 2021-02-03 DIAGNOSIS — E041 Nontoxic single thyroid nodule: Secondary | ICD-10-CM | POA: Diagnosis not present

## 2021-02-03 DIAGNOSIS — K219 Gastro-esophageal reflux disease without esophagitis: Secondary | ICD-10-CM | POA: Diagnosis not present

## 2021-02-03 DIAGNOSIS — G9340 Encephalopathy, unspecified: Secondary | ICD-10-CM | POA: Diagnosis not present

## 2021-02-03 DIAGNOSIS — M5459 Other low back pain: Secondary | ICD-10-CM | POA: Diagnosis not present

## 2021-02-03 DIAGNOSIS — F02818 Dementia in other diseases classified elsewhere, unspecified severity, with other behavioral disturbance: Secondary | ICD-10-CM | POA: Diagnosis not present

## 2021-02-03 DIAGNOSIS — Z0184 Encounter for antibody response examination: Secondary | ICD-10-CM | POA: Diagnosis not present

## 2021-02-03 DIAGNOSIS — R29719 NIHSS score 19: Secondary | ICD-10-CM | POA: Diagnosis not present

## 2021-02-03 DIAGNOSIS — K5900 Constipation, unspecified: Secondary | ICD-10-CM | POA: Diagnosis not present

## 2021-02-03 DIAGNOSIS — F01511 Vascular dementia, unspecified severity, with agitation: Secondary | ICD-10-CM | POA: Diagnosis not present

## 2021-02-03 DIAGNOSIS — M2550 Pain in unspecified joint: Secondary | ICD-10-CM | POA: Diagnosis not present

## 2021-02-03 DIAGNOSIS — M79669 Pain in unspecified lower leg: Secondary | ICD-10-CM | POA: Diagnosis not present

## 2021-02-03 DIAGNOSIS — F03918 Unspecified dementia, unspecified severity, with other behavioral disturbance: Secondary | ICD-10-CM | POA: Diagnosis not present

## 2021-02-03 DIAGNOSIS — F32A Depression, unspecified: Secondary | ICD-10-CM | POA: Diagnosis not present

## 2021-02-03 DIAGNOSIS — I82409 Acute embolism and thrombosis of unspecified deep veins of unspecified lower extremity: Secondary | ICD-10-CM | POA: Diagnosis not present

## 2021-02-03 DIAGNOSIS — E042 Nontoxic multinodular goiter: Secondary | ICD-10-CM | POA: Diagnosis not present

## 2021-02-03 DIAGNOSIS — M79604 Pain in right leg: Secondary | ICD-10-CM | POA: Diagnosis not present

## 2021-02-03 DIAGNOSIS — R296 Repeated falls: Secondary | ICD-10-CM | POA: Diagnosis not present

## 2021-02-03 DIAGNOSIS — G8911 Acute pain due to trauma: Secondary | ICD-10-CM | POA: Diagnosis not present

## 2021-02-03 DIAGNOSIS — J90 Pleural effusion, not elsewhere classified: Secondary | ICD-10-CM | POA: Diagnosis not present

## 2021-02-03 DIAGNOSIS — M47816 Spondylosis without myelopathy or radiculopathy, lumbar region: Secondary | ICD-10-CM | POA: Diagnosis not present

## 2021-02-03 DIAGNOSIS — S3992XA Unspecified injury of lower back, initial encounter: Secondary | ICD-10-CM | POA: Diagnosis not present

## 2021-02-03 DIAGNOSIS — S199XXA Unspecified injury of neck, initial encounter: Secondary | ICD-10-CM | POA: Diagnosis not present

## 2021-02-03 DIAGNOSIS — Z20822 Contact with and (suspected) exposure to covid-19: Secondary | ICD-10-CM | POA: Diagnosis not present

## 2021-02-03 DIAGNOSIS — N390 Urinary tract infection, site not specified: Secondary | ICD-10-CM | POA: Diagnosis not present

## 2021-02-13 DIAGNOSIS — I10 Essential (primary) hypertension: Secondary | ICD-10-CM | POA: Diagnosis not present

## 2021-02-13 DIAGNOSIS — K219 Gastro-esophageal reflux disease without esophagitis: Secondary | ICD-10-CM | POA: Diagnosis not present

## 2021-02-13 DIAGNOSIS — R270 Ataxia, unspecified: Secondary | ICD-10-CM | POA: Diagnosis not present

## 2021-02-13 DIAGNOSIS — I82499 Acute embolism and thrombosis of other specified deep vein of unspecified lower extremity: Secondary | ICD-10-CM | POA: Diagnosis not present

## 2021-02-13 DIAGNOSIS — E43 Unspecified severe protein-calorie malnutrition: Secondary | ICD-10-CM | POA: Diagnosis not present

## 2021-02-13 DIAGNOSIS — G218 Other secondary parkinsonism: Secondary | ICD-10-CM | POA: Diagnosis not present

## 2021-02-13 DIAGNOSIS — E782 Mixed hyperlipidemia: Secondary | ICD-10-CM | POA: Diagnosis not present

## 2021-02-14 DIAGNOSIS — K219 Gastro-esophageal reflux disease without esophagitis: Secondary | ICD-10-CM | POA: Diagnosis not present

## 2021-02-14 DIAGNOSIS — E43 Unspecified severe protein-calorie malnutrition: Secondary | ICD-10-CM | POA: Diagnosis not present

## 2021-02-14 DIAGNOSIS — G218 Other secondary parkinsonism: Secondary | ICD-10-CM | POA: Diagnosis not present

## 2021-02-14 DIAGNOSIS — R270 Ataxia, unspecified: Secondary | ICD-10-CM | POA: Diagnosis not present

## 2021-02-14 DIAGNOSIS — I82499 Acute embolism and thrombosis of other specified deep vein of unspecified lower extremity: Secondary | ICD-10-CM | POA: Diagnosis not present

## 2021-02-14 DIAGNOSIS — E782 Mixed hyperlipidemia: Secondary | ICD-10-CM | POA: Diagnosis not present

## 2021-02-14 DIAGNOSIS — I10 Essential (primary) hypertension: Secondary | ICD-10-CM | POA: Diagnosis not present

## 2021-02-16 DIAGNOSIS — F01C18 Vascular dementia, severe, with other behavioral disturbance: Secondary | ICD-10-CM | POA: Diagnosis not present

## 2021-02-16 DIAGNOSIS — E43 Unspecified severe protein-calorie malnutrition: Secondary | ICD-10-CM | POA: Diagnosis not present

## 2021-02-16 DIAGNOSIS — G218 Other secondary parkinsonism: Secondary | ICD-10-CM | POA: Diagnosis not present

## 2021-02-16 DIAGNOSIS — I10 Essential (primary) hypertension: Secondary | ICD-10-CM | POA: Diagnosis not present

## 2021-02-17 DIAGNOSIS — E46 Unspecified protein-calorie malnutrition: Secondary | ICD-10-CM | POA: Diagnosis not present

## 2021-02-17 DIAGNOSIS — E7849 Other hyperlipidemia: Secondary | ICD-10-CM | POA: Diagnosis not present

## 2021-02-17 DIAGNOSIS — R1310 Dysphagia, unspecified: Secondary | ICD-10-CM | POA: Diagnosis not present

## 2021-02-17 DIAGNOSIS — I10 Essential (primary) hypertension: Secondary | ICD-10-CM | POA: Diagnosis not present

## 2021-02-17 DIAGNOSIS — I739 Peripheral vascular disease, unspecified: Secondary | ICD-10-CM | POA: Diagnosis not present

## 2021-02-17 DIAGNOSIS — R270 Ataxia, unspecified: Secondary | ICD-10-CM | POA: Diagnosis not present

## 2021-02-17 DIAGNOSIS — G218 Other secondary parkinsonism: Secondary | ICD-10-CM | POA: Diagnosis not present

## 2021-02-17 DIAGNOSIS — F32A Depression, unspecified: Secondary | ICD-10-CM | POA: Diagnosis not present

## 2021-03-24 DEATH — deceased
